# Patient Record
Sex: Female | Born: 1935 | Race: White | Hispanic: No | Smoking: Never smoker
Health system: Southern US, Community
[De-identification: ages and names within clinical notes are randomized; demographics above are authoritative.]

## PROBLEM LIST (undated history)

## (undated) DIAGNOSIS — M1712 Unilateral primary osteoarthritis, left knee: Secondary | ICD-10-CM

## (undated) DIAGNOSIS — E785 Hyperlipidemia, unspecified: Secondary | ICD-10-CM

## (undated) DIAGNOSIS — R112 Nausea with vomiting, unspecified: Secondary | ICD-10-CM

## (undated) DIAGNOSIS — R5383 Other fatigue: Secondary | ICD-10-CM

## (undated) DIAGNOSIS — M1990 Unspecified osteoarthritis, unspecified site: Secondary | ICD-10-CM

## (undated) DIAGNOSIS — R059 Cough, unspecified: Secondary | ICD-10-CM

## (undated) DIAGNOSIS — D649 Anemia, unspecified: Secondary | ICD-10-CM

## (undated) DIAGNOSIS — R238 Other skin changes: Secondary | ICD-10-CM

## (undated) DIAGNOSIS — Z9889 Other specified postprocedural states: Secondary | ICD-10-CM

## (undated) DIAGNOSIS — R05 Cough: Secondary | ICD-10-CM

## (undated) DIAGNOSIS — J40 Bronchitis, not specified as acute or chronic: Secondary | ICD-10-CM

## (undated) DIAGNOSIS — R233 Spontaneous ecchymoses: Secondary | ICD-10-CM

## (undated) DIAGNOSIS — C801 Malignant (primary) neoplasm, unspecified: Secondary | ICD-10-CM

## (undated) DIAGNOSIS — F419 Anxiety disorder, unspecified: Secondary | ICD-10-CM

## (undated) HISTORY — DX: Hyperlipidemia, unspecified: E78.5

## (undated) HISTORY — PX: OVARY SURGERY: SHX727

## (undated) HISTORY — DX: Malignant (primary) neoplasm, unspecified: C80.1

## (undated) HISTORY — DX: Other skin changes: R23.8

## (undated) HISTORY — DX: Bronchitis, not specified as acute or chronic: J40

## (undated) HISTORY — PX: KNEE SURGERY: SHX244

## (undated) HISTORY — DX: Cough, unspecified: R05.9

## (undated) HISTORY — DX: Unspecified osteoarthritis, unspecified site: M19.90

## (undated) HISTORY — DX: Spontaneous ecchymoses: R23.3

## (undated) HISTORY — DX: Other fatigue: R53.83

## (undated) HISTORY — DX: Anemia, unspecified: D64.9

## (undated) HISTORY — DX: Cough: R05

---
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
## 1964-12-10 HISTORY — PX: APPENDECTOMY: SHX54

## 1966-12-10 HISTORY — PX: SPIDER VEIN INJECTION: SHX783

## 1975-12-11 HISTORY — PX: ABDOMINAL HYSTERECTOMY: SHX81

## 1994-12-10 HISTORY — PX: ANKLE SURGERY: SHX546

## 2003-08-04 ENCOUNTER — Other Ambulatory Visit: Admission: RE | Admit: 2003-08-04 | Discharge: 2003-08-04 | Payer: Self-pay | Admitting: Obstetrics and Gynecology

## 2003-08-04 ENCOUNTER — Encounter: Payer: Self-pay | Admitting: *Deleted

## 2003-08-04 ENCOUNTER — Encounter: Admission: RE | Admit: 2003-08-04 | Discharge: 2003-08-04 | Payer: Self-pay | Admitting: *Deleted

## 2003-08-27 ENCOUNTER — Ambulatory Visit (HOSPITAL_COMMUNITY): Admission: RE | Admit: 2003-08-27 | Discharge: 2003-08-27 | Payer: Self-pay | Admitting: *Deleted

## 2003-08-30 ENCOUNTER — Encounter: Payer: Self-pay | Admitting: *Deleted

## 2003-08-30 ENCOUNTER — Ambulatory Visit (HOSPITAL_COMMUNITY): Admission: RE | Admit: 2003-08-30 | Discharge: 2003-08-30 | Payer: Self-pay | Admitting: *Deleted

## 2004-12-12 ENCOUNTER — Ambulatory Visit: Payer: Self-pay | Admitting: Family Medicine

## 2004-12-21 ENCOUNTER — Encounter: Admission: RE | Admit: 2004-12-21 | Discharge: 2004-12-21 | Payer: Self-pay | Admitting: Orthopedic Surgery

## 2005-10-18 ENCOUNTER — Ambulatory Visit: Payer: Self-pay | Admitting: Family Medicine

## 2006-02-25 ENCOUNTER — Ambulatory Visit: Payer: Self-pay | Admitting: Family Medicine

## 2006-03-08 ENCOUNTER — Encounter: Admission: RE | Admit: 2006-03-08 | Discharge: 2006-03-08 | Payer: Self-pay | Admitting: Family Medicine

## 2006-03-08 ENCOUNTER — Ambulatory Visit: Payer: Self-pay

## 2006-03-27 ENCOUNTER — Other Ambulatory Visit: Admission: RE | Admit: 2006-03-27 | Discharge: 2006-03-27 | Payer: Self-pay | Admitting: Family Medicine

## 2006-03-27 ENCOUNTER — Ambulatory Visit: Payer: Self-pay | Admitting: Family Medicine

## 2006-04-12 ENCOUNTER — Encounter: Admission: RE | Admit: 2006-04-12 | Discharge: 2006-04-12 | Payer: Self-pay | Admitting: Family Medicine

## 2006-04-25 ENCOUNTER — Ambulatory Visit: Payer: Self-pay | Admitting: Cardiovascular Disease

## 2006-07-11 ENCOUNTER — Ambulatory Visit: Payer: Self-pay | Admitting: Family Medicine

## 2006-10-03 ENCOUNTER — Ambulatory Visit: Payer: Self-pay | Admitting: Family Medicine

## 2006-11-27 ENCOUNTER — Encounter: Admission: RE | Admit: 2006-11-27 | Discharge: 2006-11-27 | Payer: Self-pay | Admitting: Orthopedic Surgery

## 2006-12-20 ENCOUNTER — Encounter: Admission: RE | Admit: 2006-12-20 | Discharge: 2006-12-20 | Payer: Self-pay | Admitting: Orthopedic Surgery

## 2007-01-16 ENCOUNTER — Ambulatory Visit: Payer: Self-pay | Admitting: Family Medicine

## 2007-01-16 LAB — CONVERTED CEMR LAB
ALT: 20 units/L (ref 0–40)
AST: 18 units/L (ref 0–37)
Albumin: 3.9 g/dL (ref 3.5–5.2)
BUN: 19 mg/dL (ref 6–23)
Glucose, Bld: 106 mg/dL — ABNORMAL HIGH (ref 70–99)
Hgb A1c MFr Bld: 5.8 % (ref 4.6–6.0)
Sodium: 140 meq/L (ref 135–145)
Total Bilirubin: 0.7 mg/dL (ref 0.3–1.2)

## 2007-01-20 ENCOUNTER — Encounter: Payer: Self-pay | Admitting: Family Medicine

## 2007-02-28 ENCOUNTER — Encounter: Admission: RE | Admit: 2007-02-28 | Discharge: 2007-02-28 | Payer: Self-pay | Admitting: Orthopedic Surgery

## 2007-08-07 DIAGNOSIS — I8390 Asymptomatic varicose veins of unspecified lower extremity: Secondary | ICD-10-CM | POA: Insufficient documentation

## 2007-08-07 DIAGNOSIS — F419 Anxiety disorder, unspecified: Secondary | ICD-10-CM | POA: Insufficient documentation

## 2007-09-02 ENCOUNTER — Ambulatory Visit: Payer: Self-pay | Admitting: Family Medicine

## 2007-10-02 ENCOUNTER — Encounter: Payer: Self-pay | Admitting: Family Medicine

## 2007-10-30 ENCOUNTER — Ambulatory Visit: Payer: Self-pay | Admitting: Family Medicine

## 2008-10-13 ENCOUNTER — Ambulatory Visit: Payer: Self-pay | Admitting: Family Medicine

## 2009-11-22 DIAGNOSIS — R799 Abnormal finding of blood chemistry, unspecified: Secondary | ICD-10-CM | POA: Insufficient documentation

## 2009-11-22 DIAGNOSIS — R599 Enlarged lymph nodes, unspecified: Secondary | ICD-10-CM | POA: Insufficient documentation

## 2009-11-22 DIAGNOSIS — R5381 Other malaise: Secondary | ICD-10-CM | POA: Insufficient documentation

## 2010-04-12 DIAGNOSIS — L659 Nonscarring hair loss, unspecified: Secondary | ICD-10-CM | POA: Insufficient documentation

## 2010-04-12 DIAGNOSIS — R229 Localized swelling, mass and lump, unspecified: Secondary | ICD-10-CM | POA: Insufficient documentation

## 2010-04-12 DIAGNOSIS — E559 Vitamin D deficiency, unspecified: Secondary | ICD-10-CM | POA: Insufficient documentation

## 2010-04-12 LAB — HM PAP SMEAR: HM Pap smear: NEGATIVE

## 2010-04-13 ENCOUNTER — Ambulatory Visit: Payer: Self-pay | Admitting: Family Medicine

## 2010-06-09 ENCOUNTER — Ambulatory Visit: Payer: Self-pay

## 2010-07-05 ENCOUNTER — Ambulatory Visit: Payer: Self-pay

## 2010-08-30 ENCOUNTER — Encounter: Admission: RE | Admit: 2010-08-30 | Discharge: 2010-08-30 | Payer: Self-pay | Admitting: Orthopedic Surgery

## 2011-08-11 DIAGNOSIS — J40 Bronchitis, not specified as acute or chronic: Secondary | ICD-10-CM

## 2011-08-11 HISTORY — DX: Bronchitis, not specified as acute or chronic: J40

## 2011-09-25 ENCOUNTER — Ambulatory Visit (INDEPENDENT_AMBULATORY_CARE_PROVIDER_SITE_OTHER): Payer: Medicare Other | Admitting: General Surgery

## 2011-09-25 ENCOUNTER — Other Ambulatory Visit (INDEPENDENT_AMBULATORY_CARE_PROVIDER_SITE_OTHER): Payer: Self-pay | Admitting: General Surgery

## 2011-09-25 ENCOUNTER — Encounter (INDEPENDENT_AMBULATORY_CARE_PROVIDER_SITE_OTHER): Payer: Self-pay | Admitting: General Surgery

## 2011-09-25 VITALS — BP 116/84 | HR 64 | Temp 96.3°F | Resp 20 | Ht 67.5 in | Wt 205.4 lb

## 2011-09-25 DIAGNOSIS — J40 Bronchitis, not specified as acute or chronic: Secondary | ICD-10-CM

## 2011-09-25 DIAGNOSIS — D649 Anemia, unspecified: Secondary | ICD-10-CM

## 2011-09-25 DIAGNOSIS — R1909 Other intra-abdominal and pelvic swelling, mass and lump: Secondary | ICD-10-CM | POA: Insufficient documentation

## 2011-09-25 NOTE — Progress Notes (Signed)
Chief Complaint  Patient presents with  . Other    new pt- eval of umbilical hernia     HPI Nancy Downs is a 75 y.o. female.   HPI She is self-referred for which she feels is an umbilical hernia. She noticed a nodule in the umbilicus about 2-3 months ago. He was also noted by her nurse practitioner. It doesn't cause her pain and less somebody touches it. No nausea or vomiting. She has had a recent fever and cough and is being treated for bronchitis currently. No significant change in her bowel habits. No obstructive symptoms. She has noted some discoloration of the umbilicus. Past Medical History  Diagnosis Date  . Hyperlipidemia   . Cancer   . Arthritis   . Anemia   . Fever   . Cough   . Constipation   . Easy bruising   . Fatigue   . Bronchitis     Past Surgical History  Procedure Date  . Tonsillectomy and adenoidectomy 1945  . Appendectomy 1966  . Spider vein injection 1968    varicose veins stripped   . Abdominal hysterectomy 1977    uterine cancer   . Ankle surgery 1996    broken ankle plates and pins put in place   . Knee surgery 2004/2012    Family History  Problem Relation Age of Onset  . Cancer Mother 23    ovarian   . Cancer Father 31    lung cancer    Social History History  Substance Use Topics  . Smoking status: Never Smoker   . Smokeless tobacco: Never Used  . Alcohol Use: Yes    No Known Allergies  Current Outpatient Prescriptions  Medication Sig Dispense Refill  . ALPRAZolam (XANAX) 0.5 MG tablet Take 0.5 mg by mouth at bedtime as needed.        Marland Kitchen amoxicillin-clavulanate (AUGMENTIN) 875-125 MG per tablet       . chlorhexidine (PERIDEX) 0.12 % solution       . citalopram (CELEXA) 20 MG tablet       . clobetasol (TEMOVATE) 0.05 % external solution       . diphenhydramine-acetaminophen (TYLENOL PM) 25-500 MG TABS Take 1 tablet by mouth at bedtime as needed.        . ergocalciferol (VITAMIN D2) 50000 UNITS capsule Take 50,000 Units by mouth  every 30 (thirty) days.        . halobetasol (ULTRAVATE) 0.05 % cream       . HYDROcodone-acetaminophen (VICODIN) 5-500 MG per tablet       . lovastatin (MEVACOR) 20 MG tablet         Review of Systems Review of Systems  Constitutional: Positive for fever.  Respiratory: Positive for cough.   Cardiovascular: Negative.   Gastrointestinal:       As per history of present illness.    Blood pressure 116/84, pulse 64, temperature 96.3 F (35.7 C), resp. rate 20, height 5' 7.5" (1.715 m), weight 205 lb 6 oz (93.157 kg).  Physical Exam Physical Exam  Constitutional: No distress.       Overweight.  Neck: Neck supple.  Cardiovascular: Normal rate and regular rhythm.   No murmur heard. Pulmonary/Chest: Effort normal and breath sounds normal. She has no wheezes. She has no rales.  Abdominal: Soft. Bowel sounds are normal.       Slightly tender firm umbilical nodule with mild bluish discoloration accentuated when she stands. It is not reducible. No drainage.  Musculoskeletal:  She exhibits edema.  Lymphadenopathy:    She has no cervical adenopathy.    Data Reviewed none Assessment    Umbilical nodule that has been present for 2-3 months. Differential diagnosis includes chronically incarcerated umbilical hernia, umbilical cyst, other undefined neoplasm.  She also has anemia and bronchitis. Her primary care team once her to have a colonoscopy.    Plan    Obtain chest x-ray. Obtain CT of abdomen and pelvis. Defer surgery until her pulmonary infection is cleared.  If this is an umbilical hernia, we discussed repair. This includes potentially removing the umbilicus. We may not be able to use mesh because of inflammatory changes.  I have discussed the procedure, risks, and aftercare.  Risks include but are not limited to bleeding, infection, wound problems, anesthesia, recurrence, injury to intestine, mesh problems.  We also discussed not knowing what the umbilicus would look like in the  future.  He/she seems to understand and agrees with the plan.  We'll discuss the results of her x-rays we get them back.         Heith Haigler J 09/25/2011, 2:24 PM

## 2011-09-25 NOTE — Patient Instructions (Signed)
We will call you with the results. Your lung infection will need to be cleared before we consider any surgery.

## 2011-09-27 ENCOUNTER — Ambulatory Visit (INDEPENDENT_AMBULATORY_CARE_PROVIDER_SITE_OTHER): Payer: Medicare Other | Admitting: General Surgery

## 2011-09-27 ENCOUNTER — Other Ambulatory Visit (INDEPENDENT_AMBULATORY_CARE_PROVIDER_SITE_OTHER): Payer: Self-pay | Admitting: General Surgery

## 2011-09-27 ENCOUNTER — Ambulatory Visit
Admission: RE | Admit: 2011-09-27 | Discharge: 2011-09-27 | Disposition: A | Discharge status: Home / Self Care | Payer: Medicare Other | Source: Ambulatory Visit | Attending: General Surgery | Admitting: General Surgery

## 2011-09-27 VITALS — BP 126/82 | HR 88 | Temp 97.8°F | Resp 16 | Ht 67.5 in | Wt 206.2 lb

## 2011-09-27 DIAGNOSIS — J40 Bronchitis, not specified as acute or chronic: Secondary | ICD-10-CM

## 2011-09-27 DIAGNOSIS — D649 Anemia, unspecified: Secondary | ICD-10-CM

## 2011-09-27 DIAGNOSIS — R1909 Other intra-abdominal and pelvic swelling, mass and lump: Secondary | ICD-10-CM

## 2011-09-27 DIAGNOSIS — C799 Secondary malignant neoplasm of unspecified site: Secondary | ICD-10-CM

## 2011-09-27 DIAGNOSIS — C801 Malignant (primary) neoplasm, unspecified: Secondary | ICD-10-CM | POA: Insufficient documentation

## 2011-09-27 LAB — CBC
HCT: 36.5 % (ref 36.0–46.0)
Hemoglobin: 12.3 g/dL (ref 12.0–15.0)
MCV: 90.1 fL (ref 78.0–100.0)
Platelets: 370 10*3/uL (ref 150–400)
RBC: 4.05 MIL/uL (ref 3.87–5.11)
WBC: 9.4 10*3/uL (ref 4.0–10.5)

## 2011-09-27 LAB — COMPREHENSIVE METABOLIC PANEL
Albumin: 4.8 g/dL (ref 3.5–5.2)
Alkaline Phosphatase: 156 U/L — ABNORMAL HIGH (ref 39–117)
CO2: 26 mEq/L (ref 19–32)
Glucose, Bld: 105 mg/dL — ABNORMAL HIGH (ref 70–99)
Potassium: 4.7 mEq/L (ref 3.5–5.3)
Total Protein: 7.9 g/dL (ref 6.0–8.3)

## 2011-09-27 MED ORDER — IOHEXOL 300 MG/ML  SOLN
100.0000 mL | Freq: Once | INTRAMUSCULAR | Status: AC | PRN
  Administered 2011-09-27: 100 mL via INTRAVENOUS

## 2011-09-27 NOTE — Progress Notes (Signed)
I called Nancy Downs and asked her to come in to discuss the results of her CT scan. It demonstrates multiple tumors in her liver, and abnormal thickened stomach, a large irregular pelvic mass and other abdominal masses. There is a abdominal wall mass at the umbilicus. This is highly suspicious for metastatic cancer. Primary unknown. Chest x-ray shows no acute disease. I have discussed this extensively with her and showed her the CT pictures.  The plan now will be referral to medical oncology and I talked with Dr. Humphrey Rolls about her. We'll also send her for tumor markers and complete blood work. She seems to understand the situation and the seriousness of it.

## 2011-09-28 ENCOUNTER — Encounter: Payer: Medicare Other | Admitting: Oncology

## 2011-09-28 LAB — CA 125: CA 125: 271.7 U/mL — ABNORMAL HIGH (ref 0.0–30.2)

## 2011-09-28 LAB — PROTIME-INR
INR: 0.92 (ref <1.50)
Prothrombin Time: 12.7 seconds (ref 11.6–15.2)

## 2011-09-28 LAB — APTT: aPTT: 31 seconds (ref 24–37)

## 2011-09-28 LAB — CANCER ANTIGEN 27.29: CA 27.29: 218 U/mL — ABNORMAL HIGH (ref 0–39)

## 2011-10-02 ENCOUNTER — Encounter (HOSPITAL_BASED_OUTPATIENT_CLINIC_OR_DEPARTMENT_OTHER): Payer: BLUE CROSS/BLUE SHIELD | Admitting: Oncology

## 2011-10-02 ENCOUNTER — Other Ambulatory Visit: Payer: Self-pay | Admitting: Oncology

## 2011-10-02 DIAGNOSIS — C541 Malignant neoplasm of endometrium: Secondary | ICD-10-CM

## 2011-10-02 DIAGNOSIS — Z8542 Personal history of malignant neoplasm of other parts of uterus: Secondary | ICD-10-CM

## 2011-10-02 DIAGNOSIS — D499 Neoplasm of unspecified behavior of unspecified site: Secondary | ICD-10-CM

## 2011-10-02 DIAGNOSIS — D099 Carcinoma in situ, unspecified: Secondary | ICD-10-CM

## 2011-10-02 LAB — CBC WITH DIFFERENTIAL/PLATELET
Basophils Absolute: 0 10*3/uL (ref 0.0–0.1)
Eosinophils Absolute: 0.2 10*3/uL (ref 0.0–0.5)
HCT: 32.9 % — ABNORMAL LOW (ref 34.8–46.6)
HGB: 11.3 g/dL — ABNORMAL LOW (ref 11.6–15.9)
NEUT#: 4.3 10*3/uL (ref 1.5–6.5)
NEUT%: 68.5 % (ref 38.4–76.8)
RDW: 13 % (ref 11.2–14.5)
lymph#: 1.3 10*3/uL (ref 0.9–3.3)

## 2011-10-02 LAB — COMPREHENSIVE METABOLIC PANEL
ALT: 19 U/L (ref 0–35)
CO2: 25 mEq/L (ref 19–32)
Calcium: 9.3 mg/dL (ref 8.4–10.5)
Chloride: 101 mEq/L (ref 96–112)
Creatinine, Ser: 0.66 mg/dL (ref 0.50–1.10)
Glucose, Bld: 105 mg/dL — ABNORMAL HIGH (ref 70–99)
Total Bilirubin: 0.5 mg/dL (ref 0.3–1.2)
Total Protein: 7.2 g/dL (ref 6.0–8.3)

## 2011-10-02 LAB — CEA: CEA: <0.5 ng/mL (ref 0.0–5.0)

## 2011-10-02 LAB — LACTATE DEHYDROGENASE: LDH: 215 U/L (ref 94–250)

## 2011-10-04 ENCOUNTER — Other Ambulatory Visit: Payer: Self-pay | Admitting: Oncology

## 2011-10-04 DIAGNOSIS — C541 Malignant neoplasm of endometrium: Secondary | ICD-10-CM

## 2011-10-08 ENCOUNTER — Encounter (HOSPITAL_COMMUNITY)
Admission: RE | Admit: 2011-10-08 | Discharge: 2011-10-08 | Disposition: A | Discharge status: Home / Self Care | Payer: Medicare Other | Source: Ambulatory Visit | Attending: Oncology | Admitting: Oncology

## 2011-10-08 DIAGNOSIS — C549 Malignant neoplasm of corpus uteri, unspecified: Secondary | ICD-10-CM | POA: Insufficient documentation

## 2011-10-08 DIAGNOSIS — C541 Malignant neoplasm of endometrium: Secondary | ICD-10-CM

## 2011-10-08 MED ORDER — FLUDEOXYGLUCOSE F - 18 (FDG) INJECTION
19.8000 | Freq: Once | INTRAVENOUS | Status: AC | PRN
Start: 2011-10-08 — End: 2011-10-08
  Administered 2011-10-08: 19.8 via INTRAVENOUS

## 2011-10-09 ENCOUNTER — Other Ambulatory Visit: Payer: Self-pay | Admitting: Interventional Radiology

## 2011-10-09 ENCOUNTER — Ambulatory Visit (HOSPITAL_COMMUNITY)
Admission: RE | Admit: 2011-10-09 | Discharge: 2011-10-09 | Disposition: A | Discharge status: Home / Self Care | Payer: Medicare Other | Source: Ambulatory Visit | Attending: Oncology | Admitting: Oncology

## 2011-10-09 DIAGNOSIS — C50919 Malignant neoplasm of unspecified site of unspecified female breast: Secondary | ICD-10-CM | POA: Insufficient documentation

## 2011-10-09 DIAGNOSIS — C779 Secondary and unspecified malignant neoplasm of lymph node, unspecified: Secondary | ICD-10-CM | POA: Insufficient documentation

## 2011-10-09 DIAGNOSIS — C801 Malignant (primary) neoplasm, unspecified: Secondary | ICD-10-CM | POA: Insufficient documentation

## 2011-10-09 DIAGNOSIS — C541 Malignant neoplasm of endometrium: Secondary | ICD-10-CM

## 2011-10-09 LAB — CBC
MCH: 28.8 pg (ref 26.0–34.0)
MCV: 89.6 fL (ref 78.0–100.0)
Platelets: 321 10*3/uL (ref 150–400)
RBC: 3.86 MIL/uL — ABNORMAL LOW (ref 3.87–5.11)

## 2011-10-09 LAB — APTT: aPTT: 28 seconds (ref 24–37)

## 2011-10-12 ENCOUNTER — Other Ambulatory Visit: Payer: Self-pay | Admitting: Emergency Medicine

## 2011-10-12 ENCOUNTER — Inpatient Hospital Stay (HOSPITAL_COMMUNITY)
Admission: EM | Admit: 2011-10-12 | Discharge: 2011-10-15 | DRG: 603 | Disposition: A | Discharge status: Home / Self Care | Payer: Medicare Other | Attending: Internal Medicine | Admitting: Internal Medicine

## 2011-10-12 ENCOUNTER — Emergency Department (HOSPITAL_COMMUNITY): Payer: Medicare Other

## 2011-10-12 DIAGNOSIS — Z8542 Personal history of malignant neoplasm of other parts of uterus: Secondary | ICD-10-CM

## 2011-10-12 DIAGNOSIS — C801 Malignant (primary) neoplasm, unspecified: Secondary | ICD-10-CM | POA: Diagnosis present

## 2011-10-12 DIAGNOSIS — Z85828 Personal history of other malignant neoplasm of skin: Secondary | ICD-10-CM

## 2011-10-12 DIAGNOSIS — N390 Urinary tract infection, site not specified: Secondary | ICD-10-CM | POA: Diagnosis present

## 2011-10-12 DIAGNOSIS — R1909 Other intra-abdominal and pelvic swelling, mass and lump: Secondary | ICD-10-CM

## 2011-10-12 DIAGNOSIS — R509 Fever, unspecified: Secondary | ICD-10-CM

## 2011-10-12 DIAGNOSIS — L02219 Cutaneous abscess of trunk, unspecified: Principal | ICD-10-CM | POA: Diagnosis present

## 2011-10-12 DIAGNOSIS — R000 Tachycardia, unspecified: Secondary | ICD-10-CM | POA: Diagnosis present

## 2011-10-12 DIAGNOSIS — L03311 Cellulitis of abdominal wall: Secondary | ICD-10-CM

## 2011-10-12 DIAGNOSIS — F329 Major depressive disorder, single episode, unspecified: Secondary | ICD-10-CM | POA: Diagnosis present

## 2011-10-12 DIAGNOSIS — E785 Hyperlipidemia, unspecified: Secondary | ICD-10-CM

## 2011-10-12 DIAGNOSIS — R5081 Fever presenting with conditions classified elsewhere: Secondary | ICD-10-CM | POA: Diagnosis present

## 2011-10-12 DIAGNOSIS — Z803 Family history of malignant neoplasm of breast: Secondary | ICD-10-CM

## 2011-10-12 DIAGNOSIS — D649 Anemia, unspecified: Secondary | ICD-10-CM | POA: Diagnosis present

## 2011-10-12 DIAGNOSIS — Z8041 Family history of malignant neoplasm of ovary: Secondary | ICD-10-CM

## 2011-10-12 DIAGNOSIS — Z79899 Other long term (current) drug therapy: Secondary | ICD-10-CM

## 2011-10-12 DIAGNOSIS — L03319 Cellulitis of trunk, unspecified: Principal | ICD-10-CM | POA: Diagnosis present

## 2011-10-12 DIAGNOSIS — F3289 Other specified depressive episodes: Secondary | ICD-10-CM | POA: Diagnosis present

## 2011-10-12 DIAGNOSIS — Z801 Family history of malignant neoplasm of trachea, bronchus and lung: Secondary | ICD-10-CM

## 2011-10-12 LAB — DIFFERENTIAL
Basophils Absolute: 0 10*3/uL (ref 0.0–0.1)
Basophils Relative: 0 % (ref 0–1)
Eosinophils Absolute: 0.3 10*3/uL (ref 0.0–0.7)
Eosinophils Relative: 4 % (ref 0–5)
Lymphocytes Relative: 16 % (ref 12–46)
Lymphs Abs: 1.3 10*3/uL (ref 0.7–4.0)
Monocytes Absolute: 0.4 10*3/uL (ref 0.1–1.0)
Monocytes Relative: 6 % (ref 3–12)
Neutro Abs: 5.9 10*3/uL (ref 1.7–7.7)
Neutrophils Relative %: 74 % (ref 43–77)

## 2011-10-12 LAB — LACTIC ACID, PLASMA: Lactic Acid, Venous: 1.1 mmol/L (ref 0.5–2.2)

## 2011-10-12 LAB — CBC
HCT: 31.1 % — ABNORMAL LOW (ref 36.0–46.0)
Hemoglobin: 10.1 g/dL — ABNORMAL LOW (ref 12.0–15.0)
MCH: 29 pg (ref 26.0–34.0)
MCHC: 32.5 g/dL (ref 30.0–36.0)
MCV: 89.4 fL (ref 78.0–100.0)
Platelets: 294 10*3/uL (ref 150–400)
RBC: 3.48 MIL/uL — ABNORMAL LOW (ref 3.87–5.11)
RDW: 13.1 % (ref 11.5–15.5)
WBC: 8 10*3/uL (ref 4.0–10.5)

## 2011-10-12 LAB — COMPREHENSIVE METABOLIC PANEL
ALT: 45 U/L — ABNORMAL HIGH (ref 0–35)
AST: 34 U/L (ref 0–37)
Albumin: 3.7 g/dL (ref 3.5–5.2)
Alkaline Phosphatase: 199 U/L — ABNORMAL HIGH (ref 39–117)
BUN: 13 mg/dL (ref 6–23)
CO2: 24 mEq/L (ref 19–32)
Calcium: 9.3 mg/dL (ref 8.4–10.5)
Chloride: 99 mEq/L (ref 96–112)
Creatinine, Ser: 0.64 mg/dL (ref 0.50–1.10)
GFR calc Af Amer: >90 mL/min (ref >90)
GFR calc non Af Amer: 86 mL/min — ABNORMAL LOW (ref >90)
Glucose, Bld: 100 mg/dL — ABNORMAL HIGH (ref 70–99)
Potassium: 3.9 mEq/L (ref 3.5–5.1)
Sodium: 134 mEq/L — ABNORMAL LOW (ref 135–145)
Total Bilirubin: 0.3 mg/dL (ref 0.3–1.2)
Total Protein: 7.4 g/dL (ref 6.0–8.3)

## 2011-10-12 LAB — URINALYSIS, ROUTINE W REFLEX MICROSCOPIC
Ketones, ur: NEGATIVE mg/dL
Leukocytes, UA: NEGATIVE
Nitrite: NEGATIVE
Urobilinogen, UA: 0.2 mg/dL (ref 0.0–1.0)
pH: 5.5 (ref 5.0–8.0)

## 2011-10-12 LAB — LIPASE, BLOOD: Lipase: 31 U/L (ref 11–59)

## 2011-10-12 LAB — PROTIME-INR
INR: 0.96 (ref 0.00–1.49)
Prothrombin Time: 13 seconds (ref 11.6–15.2)

## 2011-10-13 ENCOUNTER — Emergency Department (HOSPITAL_COMMUNITY): Payer: Medicare Other

## 2011-10-13 LAB — FERRITIN: Ferritin: 164 ng/mL (ref 10–291)

## 2011-10-13 LAB — CBC
Hemoglobin: 9.4 g/dL — ABNORMAL LOW (ref 12.0–15.0)
MCH: 29.3 pg (ref 26.0–34.0)
MCV: 88.2 fL (ref 78.0–100.0)
Platelets: 262 10*3/uL (ref 150–400)
RBC: 3.21 MIL/uL — ABNORMAL LOW (ref 3.87–5.11)
WBC: 7.4 10*3/uL (ref 4.0–10.5)

## 2011-10-13 LAB — DIFFERENTIAL
Basophils Relative: 0 % (ref 0–1)
Lymphs Abs: 1.5 10*3/uL (ref 0.7–4.0)
Monocytes Relative: 7 % (ref 3–12)
Neutro Abs: 5.2 10*3/uL (ref 1.7–7.7)
Neutrophils Relative %: 70 % (ref 43–77)

## 2011-10-13 LAB — BASIC METABOLIC PANEL
CO2: 25 mEq/L (ref 19–32)
Calcium: 8.5 mg/dL (ref 8.4–10.5)
Chloride: 101 mEq/L (ref 96–112)
Glucose, Bld: 122 mg/dL — ABNORMAL HIGH (ref 70–99)
Sodium: 134 mEq/L — ABNORMAL LOW (ref 135–145)

## 2011-10-13 LAB — IRON AND TIBC
Iron: 21 ug/dL — ABNORMAL LOW (ref 42–135)
TIBC: 246 ug/dL — ABNORMAL LOW (ref 250–470)
UIBC: 225 ug/dL (ref 125–400)

## 2011-10-13 LAB — FOLATE: Folate: 16.3 ng/mL

## 2011-10-13 LAB — VITAMIN B12: Vitamin B-12: 289 pg/mL (ref 211–911)

## 2011-10-13 MED ORDER — ALUM & MAG HYDROXIDE-SIMETH 200-200-20 MG/5ML PO SUSP: 30.0000 mL | Freq: Four times a day (QID) | ORAL | Status: DC | PRN

## 2011-10-13 MED ORDER — PIPERACILLIN-TAZOBACTAM 3.375 G IVPB
3.3750 g | Freq: Three times a day (TID) | INTRAVENOUS | Status: DC
  Administered 2011-10-14 – 2011-10-15 (×4): 3.375 g via INTRAVENOUS
  Filled 2011-10-13 (×10): qty 50

## 2011-10-13 MED ORDER — CALCIUM CARBONATE-VITAMIN D 500-200 MG-UNIT PO TABS
1.0000 | ORAL_TABLET | Freq: Two times a day (BID) | ORAL | Status: DC
  Administered 2011-10-14 – 2011-10-15 (×3): 1 via ORAL
  Filled 2011-10-13 (×5): qty 1

## 2011-10-13 MED ORDER — ONDANSETRON HCL 4 MG/2ML IJ SOLN
4.0000 mg | Freq: Four times a day (QID) | INTRAMUSCULAR | Status: DC | PRN
  Administered 2011-10-14: 4 mg via INTRAVENOUS

## 2011-10-13 MED ORDER — ACETAMINOPHEN 500 MG PO TABS
500.0000 mg | ORAL_TABLET | Freq: Every day | ORAL | Status: DC
  Administered 2011-10-14: 500 mg via ORAL
  Filled 2011-10-13: qty 1

## 2011-10-13 MED ORDER — SODIUM CHLORIDE 0.9 % IV SOLN
INTRAVENOUS | Status: DC
  Administered 2011-10-14: 06:00:00 via INTRAVENOUS

## 2011-10-13 MED ORDER — FERROUS SULFATE 325 (65 FE) MG PO TABS
325.0000 mg | ORAL_TABLET | Freq: Every day | ORAL | Status: DC
  Administered 2011-10-14 – 2011-10-15 (×2): 325 mg via ORAL
  Filled 2011-10-13 (×2): qty 1

## 2011-10-13 MED ORDER — VITAMIN B-12 100 MCG PO TABS
100.0000 ug | ORAL_TABLET | Freq: Every day | ORAL | Status: DC
  Administered 2011-10-15: 100 ug via ORAL
  Filled 2011-10-13 (×4): qty 1

## 2011-10-13 MED ORDER — OXYCODONE HCL 5 MG PO TABS
5.0000 mg | ORAL_TABLET | ORAL | Status: DC | PRN
  Administered 2011-10-14 – 2011-10-15 (×3): 5 mg via ORAL

## 2011-10-13 MED ORDER — VANCOMYCIN HCL IN DEXTROSE 1-5 GM/200ML-% IV SOLN
1000.0000 mg | Freq: Two times a day (BID) | INTRAVENOUS | Status: DC
  Administered 2011-10-14: 1000 mg via INTRAVENOUS
  Filled 2011-10-13 (×6): qty 200

## 2011-10-13 MED ORDER — SIMVASTATIN 10 MG PO TABS
10.0000 mg | ORAL_TABLET | Freq: Every day | ORAL | Status: DC
  Administered 2011-10-14: 10 mg via ORAL
  Filled 2011-10-13 (×3): qty 1

## 2011-10-13 MED ORDER — VITAMIN D (ERGOCALCIFEROL) 1.25 MG (50000 UNIT) PO CAPS: 50000.0000 [IU] | ORAL_CAPSULE | ORAL | Status: DC

## 2011-10-13 MED ORDER — CITALOPRAM HYDROBROMIDE 20 MG PO TABS
20.0000 mg | ORAL_TABLET | Freq: Every day | ORAL | Status: DC
  Administered 2011-10-14 – 2011-10-15 (×2): 20 mg via ORAL
  Filled 2011-10-13 (×3): qty 1

## 2011-10-13 MED ORDER — DIPHENHYDRAMINE HCL 25 MG PO CAPS
25.0000 mg | ORAL_CAPSULE | Freq: Every day | ORAL | Status: DC
  Administered 2011-10-14: 25 mg via ORAL
  Filled 2011-10-13: qty 1

## 2011-10-13 MED ORDER — HYDROMORPHONE HCL PF 1 MG/ML IJ SOLN
0.5000 mg | INTRAMUSCULAR | Status: DC | PRN
  Administered 2011-10-14 (×3): 1 mg via INTRAVENOUS

## 2011-10-13 MED ORDER — ONDANSETRON HCL 4 MG PO TABS: 4.0000 mg | ORAL_TABLET | Freq: Four times a day (QID) | ORAL | Status: DC | PRN

## 2011-10-13 MED ORDER — ENOXAPARIN SODIUM 40 MG/0.4ML ~~LOC~~ SOLN
40.0000 mg | SUBCUTANEOUS | Status: DC
  Administered 2011-10-14 – 2011-10-15 (×2): 40 mg via SUBCUTANEOUS
  Filled 2011-10-13 (×3): qty 0.4

## 2011-10-13 MED ORDER — ALPRAZOLAM 0.5 MG PO TABS: 0.5000 mg | ORAL_TABLET | Freq: Every day | ORAL | Status: DC | PRN

## 2011-10-13 MED ORDER — FERROUS SULFATE 325 (65 FE) MG PO TABS
325.0000 mg | ORAL_TABLET | Freq: Every day | ORAL | Status: DC
  Filled 2011-10-13: qty 1

## 2011-10-13 MED ORDER — PIPERACILLIN-TAZOBACTAM 3.375 G IVPB
3.3750 g | Freq: Three times a day (TID) | INTRAVENOUS | Status: DC
  Filled 2011-10-13 (×5): qty 50

## 2011-10-13 MED ORDER — SENNA 8.6 MG PO TABS
2.0000 | ORAL_TABLET | Freq: Every day | ORAL | Status: DC | PRN
  Administered 2011-10-14: 17.2 mg via ORAL

## 2011-10-13 MED ORDER — ACETAMINOPHEN 325 MG PO TABS: 650.0000 mg | ORAL_TABLET | ORAL | Status: DC | PRN

## 2011-10-13 MED ORDER — IOHEXOL 300 MG/ML  SOLN
100.0000 mL | Freq: Once | INTRAMUSCULAR | Status: AC | PRN
  Administered 2011-10-13: 100 mL via INTRAVENOUS

## 2011-10-14 DIAGNOSIS — D649 Anemia, unspecified: Secondary | ICD-10-CM | POA: Diagnosis present

## 2011-10-14 DIAGNOSIS — L03311 Cellulitis of abdominal wall: Secondary | ICD-10-CM | POA: Diagnosis present

## 2011-10-14 DIAGNOSIS — E785 Hyperlipidemia, unspecified: Secondary | ICD-10-CM | POA: Diagnosis present

## 2011-10-14 DIAGNOSIS — R509 Fever, unspecified: Secondary | ICD-10-CM | POA: Diagnosis present

## 2011-10-14 DIAGNOSIS — N390 Urinary tract infection, site not specified: Secondary | ICD-10-CM | POA: Diagnosis present

## 2011-10-14 LAB — CBC
HCT: 28.7 % — ABNORMAL LOW (ref 36.0–46.0)
MCH: 29.1 pg (ref 26.0–34.0)
MCHC: 33.1 g/dL (ref 30.0–36.0)
RDW: 13 % (ref 11.5–15.5)

## 2011-10-14 LAB — URINE CULTURE: Culture  Setup Time: 201211030049

## 2011-10-14 LAB — BASIC METABOLIC PANEL
BUN: 7 mg/dL (ref 6–23)
Calcium: 8.8 mg/dL (ref 8.4–10.5)
GFR calc Af Amer: >90 mL/min (ref >90)
GFR calc non Af Amer: 87 mL/min — ABNORMAL LOW (ref >90)
Glucose, Bld: 110 mg/dL — ABNORMAL HIGH (ref 70–99)
Potassium: 3.8 mEq/L (ref 3.5–5.1)
Sodium: 137 mEq/L (ref 135–145)

## 2011-10-14 MED ORDER — DIPHENHYDRAMINE HCL 25 MG PO CAPS
ORAL_CAPSULE | ORAL | Status: AC
  Administered 2011-10-14: 25 mg via ORAL
  Filled 2011-10-14: qty 1

## 2011-10-14 MED ORDER — HYDROMORPHONE HCL PF 1 MG/ML IJ SOLN
INTRAMUSCULAR | Status: AC
  Filled 2011-10-14: qty 1

## 2011-10-14 MED ORDER — ACETAMINOPHEN 500 MG PO TABS
ORAL_TABLET | ORAL | Status: AC
  Administered 2011-10-14: 500 mg via ORAL
  Filled 2011-10-14: qty 1

## 2011-10-14 MED ORDER — ONDANSETRON HCL 4 MG/2ML IJ SOLN
INTRAMUSCULAR | Status: AC
  Filled 2011-10-14: qty 2

## 2011-10-14 MED ORDER — HYDROMORPHONE HCL PF 1 MG/ML IJ SOLN
INTRAMUSCULAR | Status: AC
  Administered 2011-10-14: 1 mg via INTRAVENOUS
  Filled 2011-10-14: qty 1

## 2011-10-14 MED ORDER — OXYCODONE HCL 5 MG PO TABS
ORAL_TABLET | ORAL | Status: AC
  Administered 2011-10-14: 5 mg via ORAL
  Filled 2011-10-14: qty 1

## 2011-10-14 MED ORDER — SENNA 8.6 MG PO TABS
ORAL_TABLET | ORAL | Status: AC
  Filled 2011-10-14: qty 2

## 2011-10-14 NOTE — Progress Notes (Signed)
ANTIBIOTIC CONSULT NOTE - FOLLOW UP  Pharmacy Consult for Vancomycin Indication: Abdominal wall cellulitis; fever  No Known Allergies  Patient Measurements: Height: 5\' 7"  (170.2 cm) Weight: 211 lb 13.8 oz (96.1 kg) IBW/kg (Calculated) : 61.6    Vital Signs: Temp: 98.6 F (37 C) (11/04 1026) Temp src: Oral (11/04 1026) BP: 113/65 mmHg (11/04 1026) Pulse Rate: 81  (11/04 1026) Intake/Output from previous day: 11/03 0701 - 11/04 0700 In: 885 [I.V.:885] Out: 400 [Urine:400] Intake/Output from this shift:    Labs:  Basename 10/14/11 0456 10/13/11 0720 10/12/11 2100  WBC 5.9 7.4 8.0  HGB 9.5* 9.4* 10.1*  PLT 273 262 294  LABCREA -- -- --  CREATININE 0.62 0.67 0.64   Estimated Creatinine Clearance: 74.5 ml/min (by C-G formula based on Cr of 0.62).    Microbiology: Recent Results (from the past 720 hour(s))  URINE CULTURE     Status: Normal (Preliminary result)   Collection Time   10/12/11  8:54 PM      Component Value Range Status Comment   Specimen Description URINE, CLEAN CATCH   Final    Special Requests NONE   Final    Setup Time [id number]   Final    Colony Count 60,000 COLONIES/ML   Final    Culture GRAM NEGATIVE RODS   Final    Report Status PENDING   Incomplete   CULTURE, BLOOD (ROUTINE X 2)     Status: Normal (Preliminary result)   Collection Time   10/12/11  9:10 PM      Component Value Range Status Comment   Specimen Description BLOOD LEFT AC   Final    Special Requests BOTTLES DRAWN AEROBIC AND ANAEROBIC 5 CC EACH   Final    Setup Time [id number]   Final    Culture     Final    Value:        BLOOD CULTURE RECEIVED NO GROWTH TO DATE CULTURE WILL BE HELD FOR 5 DAYS BEFORE ISSUING A FINAL NEGATIVE REPORT   Report Status PENDING   Incomplete   CULTURE, BLOOD (ROUTINE X 2)     Status: Normal (Preliminary result)   Collection Time   10/12/11  9:30 PM      Component Value Range Status Comment   Specimen Description BLOOD NO SITE GIVEN   Final    Special  Requests BOTTLES DRAWN AEROBIC AND ANAEROBIC 4CC   Final    Setup Time [id number]   Final    Culture     Final    Value:        BLOOD CULTURE RECEIVED NO GROWTH TO DATE CULTURE WILL BE HELD FOR 5 DAYS BEFORE ISSUING A FINAL NEGATIVE REPORT   Report Status PENDING   Incomplete    Day 2 - Vancomycin 1000mg  IV q12h Day 2 - Zosyn 3.375 grams IV q8h (extended-infusion)   Assessment:  Clinical improvement and plans for possible discharge tomorrow noted.  Serum creatinine is stable.  Goal of Therapy:   Vancomycin trough level 10-15 mcg/ml  Plan:   Continue present Vancomycin dosage.  No serum level monitoring indicated for now; reconsider if therapy continues beyond tomorrow.   Margretta Sidle K 10/14/2011,12:22 PM

## 2011-10-14 NOTE — Progress Notes (Signed)
Subjective:  No events overnight, patient reports feeling better  Objective:  Vital signs in last 24 hours:  Filed Vitals:   10/13/11 0900 10/14/11 0543  BP:  119/69  Pulse:  85  Temp:  98.5 F (36.9 C)  TempSrc:  Oral  Resp:  16  Height: 5\' 7"  (1.702 m)   Weight: 96.1 kg (211 lb 13.8 oz)   SpO2:  94%    Intake/Output from previous day:   Intake/Output Summary (Last 24 hours) at 10/14/11 0928 Last data filed at 10/14/11 [id number]  Gross per 24 hour  Intake    885 ml  Output    400 ml  Net    485 ml    Physical Exam: General: Alert, awake, oriented x3, in no acute distress. HEENT: No bruits, no goiter. Heart: Regular rate and rhythm, without murmurs, rubs, gallops. Lungs: Clear to auscultation bilaterally. Abdomen: Soft, nontender, nondistended, positive bowel sounds. Abdominal cellulitis improving less erythema noted today. Extremities: No clubbing cyanosis or edema with positive pedal pulses. Neuro: Grossly intact, nonfocal.   Lab Results:  Basic Metabolic Panel:    Component Value Date/Time   NA 137 10/14/2011 0456   K 3.8 10/14/2011 0456   CL 104 10/14/2011 0456   CO2 25 10/14/2011 0456   BUN 7 10/14/2011 0456   CREATININE 0.62 10/14/2011 0456   CREATININE 0.71 09/27/2011 1724   GLUCOSE 110* 10/14/2011 0456   CALCIUM 8.8 10/14/2011 0456   CBC:    Component Value Date/Time   WBC 5.9 10/14/2011 0456   HGB 9.5* 10/14/2011 0456   HGB 11.3* 10/02/2011 1134   HCT 28.7* 10/14/2011 0456   HCT 32.9* 10/02/2011 1134   PLT 273 10/14/2011 0456   PLT 295 10/02/2011 1134   MCV 88.0 10/14/2011 0456   MCV 88.2 10/02/2011 1134   NEUTROABS 5.2 10/13/2011 0720   LYMPHSABS 1.5 10/13/2011 0720   MONOABS 0.5 10/13/2011 0720   EOSABS 0.3 10/13/2011 0720   EOSABS 0.2 10/02/2011 1134   BASOSABS 0.0 10/13/2011 0720   BASOSABS 0.0 10/02/2011 1134    Recent Results (from the past 240 hour(s))  URINE CULTURE     Status: Normal (Preliminary result)   Collection Time   2011/11/04  8:54  PM      Component Value Range Status Comment   Specimen Description URINE, CLEAN CATCH   Final    Special Requests NONE   Final    Setup Time [id number]   Final    Colony Count 60,000 COLONIES/ML   Final    Culture GRAM NEGATIVE RODS   Final    Report Status PENDING   Incomplete     Studies/Results: Dg Chest 2 View 04-Nov-2011   IMPRESSION:  1.  Minimal left basilar linear atelectasis. 2.  Minimal minimal mid to lower thoracic chronic vertebral wedge deformity.    Ct Abdomen Pelvis W Contrast 10/13/2011   IMPRESSION: Interval disease progression with increase in size of several of the lesions, including within the pelvis, along the liver surface, and periumbilical/anterior abdominal wall.  There is mild bilateral hydroureteronephrosis, left greater than right secondary to the pelvic process.  Small right and trace left pleural effusions.  Associated consolidations; atelectasis versus pneumonia.    Medications: Scheduled Meds:   . acetaminophen  500 mg Oral QHS  . calcium-vitamin D  1 tablet Oral BID WC  . citalopram  20 mg Oral Daily  . diphenhydrAMINE  25 mg Oral QHS  . enoxaparin (LOVENOX) injection  40 mg Subcutaneous  Q24H  . ferrous sulfate  325 mg Oral Q breakfast  . piperacillin-tazobactam (ZOSYN)  IV  3.375 g Intravenous Q8H  . simvastatin  10 mg Oral q1800  . vancomycin  1,000 mg Intravenous Q12H  . vitamin B-12  100 mcg Oral Daily  . Vitamin D (Ergocalciferol)  50,000 Units Oral Q7 days  . DISCONTD: ferrous sulfate  325 mg Oral Q breakfast   Continuous Infusions:   . sodium chloride 100 mL/hr at 10/14/11 0623   PRN Meds:.acetaminophen, ALPRAZolam, alum & mag hydroxide-simeth, HYDROmorphone (DILAUDID) injection, ondansetron (ZOFRAN) IV, ondansetron, oxyCODONE, senna  Assessment/Plan:  Principal Problem:  *Abdominal wall cellulitis - clinically improving and patient reports significant improvement in discomfort. Blood cultures are still pending and for that reason  we'll continue vancomycin Zosyn for now and narrow the spectrum last final results with sensitivities are back. Patient remains afebrile with no leukocytosis.  Active Problems:  Carcinoma of unknown primary - followed by Dr. Humphrey Rolls, patient has appointment scheduled on Tuesday, 10/16/2011. We're anticipating discharge November 5 for the patient to followup with Dr. Humphrey Rolls in outpatient setting. If patient stays in the hospital longer we will call Dr. Humphrey Rolls for in-hospital evaluation.   Anemia - hemoglobin hematocrit remained stable  Fever - patient remains afebrile over 48 hours. Continue broad-spectrum antibiotics for now.   HLD (hyperlipidemia) - continue home medication regimen   UTI - urine cultures positive for gram-negative rods but no sensitivities and final culture back yet. We'll continue broad-spectrum antibiotics as noted in primary principal problem.      LOS: 2 days   MAGICK-Cole Eastridge 10/14/2011, 9:28 AM

## 2011-10-15 MED ORDER — OXYCODONE HCL 5 MG PO TABS: 5.0000 mg | ORAL_TABLET | ORAL | Status: DC | PRN

## 2011-10-15 MED ORDER — SULFAMETHOXAZOLE-TMP DS 800-160 MG PO TABS
1.0000 | ORAL_TABLET | Freq: Two times a day (BID) | ORAL | Status: DC
  Administered 2011-10-15: 1 via ORAL
  Filled 2011-10-15 (×2): qty 1

## 2011-10-15 MED ORDER — SULFAMETHOXAZOLE-TRIMETHOPRIM 800-160 MG PO TABS: 1.0000 | ORAL_TABLET | Freq: Two times a day (BID) | ORAL | Status: DC

## 2011-10-15 MED ORDER — OXYCODONE HCL 5 MG PO TABS
ORAL_TABLET | ORAL | Status: AC
  Administered 2011-10-15: 5 mg via ORAL
  Filled 2011-10-15: qty 1

## 2011-10-15 NOTE — Progress Notes (Signed)
Pt D/c home, denies pain, complications resolved, medication /D/C instructions done.

## 2011-10-15 NOTE — Discharge Summary (Signed)
Patient ID: Nancy Downs MRN: [id number] DOB/AGE: 1936/05/13 75 y.o.  Admit date: 10/12/2011 Discharge date: 10/15/2011  Primary Care Physician:  Margarita Rana, MD  Discharge Diagnoses:    Present on Admission:  .Abdominal wall cellulitis .Anemia .Fever .HLD (hyperlipidemia) .Carcinoma of unknown primary .UTI (lower urinary tract infection)  Principal Problem:  *Abdominal wall cellulitis Active Problems:  Carcinoma of unknown primary  Anemia  Fever  HLD (hyperlipidemia)  UTI (lower urinary tract infection)   Current Discharge Medication List    START taking these medications   Details  oxyCODONE (OXY IR/ROXICODONE) 5 MG immediate release tablet Take 1 tablet (5 mg total) by mouth every 4 (four) hours as needed. Qty: 30 tablet, Refills: 0    sulfamethoxazole-trimethoprim (BACTRIM DS) 800-160 MG per tablet Take 1 tablet by mouth 2 (two) times daily. Qty: 28 tablet, Refills: 0      CONTINUE these medications which have NOT CHANGED   Details  ALPRAZolam (XANAX) 0.5 MG tablet Take 0.5 mg by mouth at bedtime as needed.      calcium-vitamin D (OSCAL WITH D) 500-200 MG-UNIT per tablet Take 1 tablet by mouth 2 (two) times daily.      citalopram (CELEXA) 20 MG tablet     diphenhydramine-acetaminophen (TYLENOL PM) 25-500 MG TABS Take 1 tablet by mouth at bedtime as needed.      ergocalciferol (VITAMIN D2) 50000 UNITS capsule Take 50,000 Units by mouth every Friday.     ferrous sulfate 325 (65 FE) MG tablet Take 325 mg by mouth daily with breakfast.      HYDROcodone-acetaminophen (VICODIN) 5-500 MG per tablet Take 0.5-1 tablets by mouth every 6 (six) hours as needed. For pain    lovastatin (MEVACOR) 20 MG tablet     vitamin B-12 (CYANOCOBALAMIN) 250 MCG tablet Take 250 mcg by mouth daily.          Disposition and Follow-up: Pt will follow up with Dr. Humphrey Rolls, oncology specialist Nov 6 th, 2012. Please note patient was discharged on Bactrim double strength for 14 days  total treatment and therapy. Currently blood cultures final results are still pending and if any changes noted we will let the patient now and will readjust antibiotic regimen if indicated.  Consults: None  Significant Diagnostic Studies:  Dg Chest 2 View 10/12/2011     Findings: Normal sized heart.  Minimal linear atelectasis at the left lateral lung base.  Otherwise, clear lungs.  Mild scoliosis. Diffuse osteopenia.  Stable minimal mid to lower thoracic vertebral wedge deformity.  IMPRESSION:  1.  Minimal left basilar linear atelectasis. 2.  Minimal minimal mid to lower thoracic chronic vertebral wedge deformity.    Ct Abdomen Pelvis W Contrast 10/13/2011   Findings: Small right and trace left pleural effusions.  Associated consolidations; atelectasis versus pneumonia. Coronary artery calcification.  Since the prior study the mass along the hepatic dome measures 5.3 x 5.5 cm, without significant interval change. Perihepatic/subcapsular lesions are similar to slightly enlarged. As index along the periphery of the right hepatic lobe, measures 4.2 x 1.9 cm, 3.3 x 1.7 cm previously.  Unremarkable spleen, pancreas, gallbladder.  Stable mild extrahepatic biliary ductal prominence without obstructing lesion identified.  Unremarkable adrenal glands.  Symmetric renal enhancement with fullness of the ureters to the level of the pelvis.  No bowel obstruction.  No CT evidence for colitis.  Thin-walled bladder is displaced by the lobular pelvic mass. Largest diameter, measures 12.8 cm as compared to 12.4 cm previously.  There is a small amount  of associated fluid attenuation. No loculated fluid collection to suggest abscess. The periumbilical implant measures 2.8 x 3.1 cm, 2.3 x 2.3 cm previously.  There are numerous other smaller implants and/or nodules scattered throughout the peritoneum.  A dominant right iliac chain nodal conglomerate measures 4.8 x 3.6 cm, 4.3 x 3.5 cm previously.  There is mass effect on the  iliac vasculature however no vascular thrombus or obstruction identified.  Osteopenia and multilevel degenerative changes.  No acute or aggressive osseous abnormality identified.   IMPRESSION: Interval disease progression with increase in size of several of the lesions, including within the pelvis, along the liver surface, and periumbilical/anterior abdominal wall.  There is mild bilateral hydroureteronephrosis, left greater than right secondary to the pelvic process.  Small right and trace left pleural effusions.  Associated consolidations; atelectasis versus pneumonia.   Brief H and P: This is a 75 year old female, who has been undergoing an evaluation this past week for a possible metastatic cancer of unknown origin.  She underwent a PET scan 4 days ago.  Then, on the next day, had a biopsy performed of a nodule in the supraumbilical area of the abdomen and 1 day ago, she began to have worsening fevers, chills, temperatures to 101, along with increased redness across her abdomen.  Her abdominal area has been tender as well.  She has also had poor appetite.  Denies having any nausea, vomiting, diarrhea, or constipation.  She does report having black stools, but reports that she had been placed on iron for the past week after being found to be anemic.  Patient contacted the on-call oncologist and was advised to report to the emergency department for further evaluation this past evening.   Physical Exam on Discharge:  Filed Vitals:   10/14/11 1300 10/14/11 2200 10/15/11 0220 10/15/11 0520  BP: 110/67 142/78 110/62 115/62  Pulse: 85 76 68 73  Temp: 98.8 F (37.1 C) 99.2 F (37.3 C) 97.7 F (36.5 C) 98.1 F (36.7 C)  TempSrc: Oral Oral Oral Oral  Resp: 18 17 20 20   Height:      Weight:      SpO2: 92% 96% 94% 91%     Intake/Output Summary (Last 24 hours) at 10/15/11 0936 Last data filed at 10/15/11 0220  Gross per 24 hour  Intake    400 ml  Output   1750 ml  Net  -1350 ml    General:  Alert, awake, oriented x3, in no acute distress. HEENT: No bruits, no goiter. Heart: Regular rate and rhythm, without murmurs, rubs, gallops. Lungs: Clear to auscultation bilaterally. Abdomen: Soft, nontender, nondistended, positive bowel sounds. Improving erythema in the periumbilical area. Extremities: No clubbing cyanosis or edema with positive pedal pulses. Neuro: Grossly intact, nonfocal.  CBC:    Component Value Date/Time   WBC 5.9 10/14/2011 0456   HGB 9.5* 10/14/2011 0456   HGB 11.3* 10/02/2011 1134   HCT 28.7* 10/14/2011 0456   HCT 32.9* 10/02/2011 1134   PLT 273 10/14/2011 0456   PLT 295 10/02/2011 1134   MCV 88.0 10/14/2011 0456   MCV 88.2 10/02/2011 1134   NEUTROABS 5.2 10/13/2011 0720   LYMPHSABS 1.5 10/13/2011 0720   MONOABS 0.5 10/13/2011 0720   EOSABS 0.3 10/13/2011 0720   EOSABS 0.2 10/02/2011 1134   BASOSABS 0.0 10/13/2011 0720   BASOSABS 0.0 10/02/2011 0000000    Basic Metabolic Panel:    Component Value Date/Time   NA 137 10/14/2011 0456   K 3.8 10/14/2011 0456  CL 104 10/14/2011 0456   CO2 25 10/14/2011 0456   BUN 7 10/14/2011 0456   CREATININE 0.62 10/14/2011 0456   CREATININE 0.71 09/27/2011 1724   GLUCOSE 110* 10/14/2011 0456   CALCIUM 8.8 10/14/2011 0456    Hospital Course:  Principal Problem:  *Abdominal wall cellulitis - clinically improving, patient remains afebrile over 48 hours. There is also no leukocytosis. We will change antibiotics to by mouth regimen with Bactrim double strength. Patient will followup with Dr. Darrow Bussing tomorrow 10/16/2011.  Active Problems:  Carcinoma of unknown primary - per oncology recommendations   Anemia - this has been monitored during the hospitalization and hemoglobin and hematocrit have remained stable during the hospitalization. Patient has not required blood transfusions.   Fever - likely secondary to problem #1. Patient has responded well to hospital measures with broad-spectrum antibiotics and IV fluids. Abdomen overall  cellulitis clinically improving with improvement in erythema as well. Patient has remained afebrile over 48 hours.   HLD (hyperlipidemia) - stable during the hospitalization  Time spent on Discharge: Over 30 minutes  Signed: MAGICK-Nowell Sites 10/15/2011, 9:36 AM

## 2011-10-15 NOTE — Progress Notes (Signed)
[id number]/Nancy Downs/Utilization review of Chart performed. 

## 2011-10-16 ENCOUNTER — Ambulatory Visit (HOSPITAL_BASED_OUTPATIENT_CLINIC_OR_DEPARTMENT_OTHER): Payer: Medicare Other | Admitting: Oncology

## 2011-10-16 ENCOUNTER — Telehealth: Payer: Self-pay | Admitting: *Deleted

## 2011-10-16 VITALS — BP 139/73 | HR 83 | Temp 97.8°F | Ht 67.0 in | Wt 210.0 lb

## 2011-10-16 DIAGNOSIS — C801 Malignant (primary) neoplasm, unspecified: Secondary | ICD-10-CM

## 2011-10-16 DIAGNOSIS — C569 Malignant neoplasm of unspecified ovary: Secondary | ICD-10-CM

## 2011-10-16 NOTE — Telephone Encounter (Signed)
CALLED DR.ROSENBOWER OFFICE REQUESTED TO HAVE PATIENT'S PORT A CATH PLACED CHEMO SHOULD START AFTER THANKSGIVING GAVE PATIENT APPOINTMENT FOR CHEMO EDUCATION CLASS ON 10-17-2011 AT 10:00AM. GAVE PATIENT FOLLOW UP APPOINTMENT FOR [facility] ON 10-30-2011 AT 11:00AM.

## 2011-10-16 NOTE — Progress Notes (Signed)
Hematology and Oncology Follow Up Visit  Nancy Downs [id number] [birth year] 75 y.o. 10/16/2011 4:20 PM   DIAGNOSIS: 75 yo female with metastatic carcinoma likely endometrial or ovarian primary.  Encounter Diagnoses  Name Primary?  . Adenocarcinoma of unknown primary Yes  . Ovarian cancer, primary, with metastasis from ovary to other site      PAST THERAPY: #1 S/p biopsy of umbilical mass.  Interim History:  Patient is seen in followup today overall she is doing well she had her biopsy of the umbilical mass performed. Unfortunately she did develop what sounds like a cellulitis at the site. She was hospitalized [facility]  over the weekend. She was discharged yesterday with antibiotics. She still has some redness over the umbilical area. She is somewhat tender. She otherwise denies any nausea vomiting fevers chills. She did have fevers prior to going into the hospital. She also is experiencing some lower back pain. She has been taking hydrocodone on a regular basis that has helped her to control her pain. She is asking for another prescription for hydrocodone prescription. Denies having any bleeding problems she has no headaches double vision blurring of vision no fevers presently. She is denying any abdominal pain she has no peripheral paresthesias she has no bleeding.  Medications: I have reviewed the patient's current medications.  Allergies: No Known Allergies  Past Medical History, Surgical history, Social history, and Family History were reviewed and updated.  Review of Systems: Constitutional:  Negative for fever, chills, night sweats, anorexia, weight loss, pain. Cardiovascular: no chest pain or dyspnea on exertion Respiratory: no cough, shortness of breath, or wheezing Neurological: no TIA or stroke symptoms Dermatological: positive for cellulitis on the very umbilical he. ENT: negative Skin Gastrointestinal: no abdominal pain, change in bowel habits, or black or  bloody stools Genito-Urinary: no dysuria, trouble voiding, or hematuria Hematological and Lymphatic: negative  Musculoskeletal: negative Remaining ROS negative.  Physical Exam:  Blood pressure 139/73, pulse 83, temperature 97.8 F (36.6 C), temperature source Oral, height 5\' 7"  (1.702 m), weight 210 lb (95.255 kg).  ECOG: 1   General appearance: alert and cooperative Chest wall: no tenderness Cardio: regular rate and rhythm, S1, S2 normal, no murmur, click, rub or gallop GI: Abdomen appears full there is cellulitis noted around the umbilicus. There is a notable node iin umbilicus region. Extremities: extremities normal, atraumatic, no cyanosis or edema Neurologic: Grossly normal   Lab Results: Lab Results  Component Value Date   WBC 5.9 10/14/2011   HGB 9.5* 10/14/2011   HCT 28.7* 10/14/2011   MCV 88.0 10/14/2011   PLT 273 10/14/2011     Chemistry      Component Value Date/Time   NA 137 10/14/2011 0456   K 3.8 10/14/2011 0456   CL 104 10/14/2011 0456   CO2 25 10/14/2011 0456   BUN 7 10/14/2011 0456   CREATININE 0.62 10/14/2011 0456   CREATININE 0.71 09/27/2011 1724      Component Value Date/Time   CALCIUM 8.8 10/14/2011 0456   ALKPHOS 199* 10/12/2011 2100   AST 34 10/12/2011 2100   ALT 45* 10/12/2011 2100   BILITOT 0.3 10/12/2011 2100       Radiological Studies: 10/08/2011  *RADIOLOGY REPORT*  Clinical Data:  Initial treatment strategy for endometrial carcinoma.  NUCLEAR MEDICINE PET CT INITIAL (PI) SKULL BASE TO THIGH  Technique:  19.8 mCi F-18 FDG was injected intravenously via the right antecubital fossa.  Full-ring PET imaging was performed from the skull base through  the mid-thighs 65  minutes after injection. CT data was obtained and used for attenuation correction and anatomic localization only.  (This was not acquired as a diagnostic CT examination.)  Fasting Blood Glucose:  97  Patient Weight:  205 pounds.  Comparison:  None  Findings: No hypermetabolic lymph nodes  within the soft tissues of the neck.  No hypermetabolic supraclavicular or axillary lymph nodes.  There are no hypermetabolic mediastinal or hilar lymph nodes.  No pericardial effusion.  Small right pleural effusion is present.  No pulmonary nodules or masses identified.  Within the posterior right hepatic lobe there is a parenchymal focus of increased FDG uptake with an SUV max equal to 5.5.  Suspicious for liver metastases.  Corresponding low density structure on contrast enhanced CT is noted.  Spleen is negative.  The adrenal glands are normal.  Gallbladder is negative.  No biliary dilatation.  Normal appearance of the pancreas.  Normal appearance of the right kidney.  There is a left-sided hydronephrosis and hydroureter as before.  No hypermetabolic retroperitoneal lymph nodes.   Bilateral hypermetabolic cystic adnexal masses are identified. Mass within the right adnexa measures 3.8 by 4.2 cm and has an SUV max equal to 16.5.  Large solid and cystic mass within the pelvis measures 8.2 by 11.0 by 8.7 cm.  This has an SUV max equal to 17.0.  No hypermetabolic inguinal adenopathy.  Peri umbilical anterior abdominal wall metastasis is identified measuring 2.4 cm with an SUV max equal to 11.8.   Extensive peri hepatic peritoneal disease is identified.  Mass along the dome of the right hepatic lobe measures 5.1 x 5.8.  The SUV max is equal to 12.4.  Hypermetabolic tumor along the posterior lateral aspect the right hepatic lobe has an SUV max equal to 9.7.  No evidence for bowel obstruction.  No specific features to suggest hypermetabolic bone metastasis.  IMPRESSION:  1.  Extensive hypermetabolic tumor including metastasis to the periumbilical anterior abdominal wall, perihepatic peritoneal surface, and right hepatic lobe. 2.  Large central pelvic soft tissue mass as detailed above. 3.  Bilateral hypermetabolic adnexal masses. 4.  Left-sided hydronephrosis and hydroureter.  Secondary to pelvic process. 5.  Right  pleural effusion.  Original Report Authenticated By: Angelita Ingles, M.D.      IMPRESSIONS AND PLAN: A 75 y.o. female with   #1 new diagnosis of what looks like ovarian and endometrial cancer. She most likely has ovarian cancer although endometrial cancer is still in the differential.  #2 I will plan on referring her to gyne/onc-for consultation.  #3 patient will need chemotherapy recommendation is carboplatinum and Taxol regimen given every 21 days. Risks and benefits of the treatment were discussed with the patient as well as her family. She will need a Port-A-Cath placed and I have recommended that she see Dr. Zella Richer for this. As well as chemotherapy teaching class.  Spent more than 50%of  time coordinating care and counseling.    Marcy Panning, MD Medical/Oncology [facility] [phone] (beeper) [phone] (Office)  10/16/2011, 4:20 PM 11/6/20124:20 PM

## 2011-10-17 ENCOUNTER — Encounter: Payer: Self-pay | Admitting: *Deleted

## 2011-10-17 NOTE — Progress Notes (Signed)
Erroneous encounter-disregard

## 2011-10-18 ENCOUNTER — Other Ambulatory Visit: Payer: Self-pay | Admitting: Oncology

## 2011-10-18 ENCOUNTER — Encounter (HOSPITAL_COMMUNITY): Payer: Self-pay | Admitting: Pharmacy Technician

## 2011-10-18 ENCOUNTER — Other Ambulatory Visit (INDEPENDENT_AMBULATORY_CARE_PROVIDER_SITE_OTHER): Payer: Self-pay | Admitting: General Surgery

## 2011-10-19 ENCOUNTER — Other Ambulatory Visit (HOSPITAL_COMMUNITY): Payer: Medicare Other

## 2011-10-19 LAB — CULTURE, BLOOD (ROUTINE X 2)
Culture  Setup Time: 201211030345
Culture: NO GROWTH
Culture: NO GROWTH

## 2011-10-20 NOTE — H&P (Signed)
Nancy Downs, KULT NO.:  [id number]  MEDICAL RECORD NO.:  [id number]  LOCATION:  [facility]                         FACILITY:  [facility]  PHYSICIAN:  Jana Hakim, M.D. DATE OF BIRTH:  Aug 20, 1936  DATE OF ADMISSION:  10/12/2011 DATE OF DISCHARGE:                             HISTORY & PHYSICAL   DATE OF ADMISSION:  October 13, 2011.  PRIMARY CARE PHYSICIAN:  Orthoptist, Dr. Humphrey Rolls.  CHIEF COMPLAINT:  Fever.  HISTORY OF PRESENT ILLNESS:  This is a 75 year old female, who has been undergoing an evaluation this past week for a possible metastatic cancer of unknown origin.  She underwent a PET scan 4 days ago.  Then, on the next day, had a biopsy performed of a nodule in the supraumbilical area of the abdomen and 1 day ago, she began to have worsening fevers, chills, temperatures to 101, along with increased redness across her abdomen.  Her abdominal area has been tender as well.  She has also had poor appetite.  Denies having any nausea, vomiting, diarrhea, or constipation.  She does report having black stools, but reports that she had been placed on iron for the past week after being found to be anemic.  Patient contacted the on-call oncologist and was advised to report to the emergency department for further evaluation this past evening.  PAST MEDICAL HISTORY:  Significant for, 1. Stage III cancer of the uterus in 1977. 2. Also, a history of skin cancer, which was squamous cell carcinoma,     status post excisions on the head and leg. 3. Hyperlipidemia. 4. Anemia. 5. Depression.  PAST SURGICAL HISTORY:  History of a hysterectomy.  MEDICATIONS:  Include Celexa and lovastatin.  ALLERGIES:  No known drug allergies.  SOCIAL HISTORY:  Patient is retired.  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 She denies any tobacco usage, but reports having history of secondhand smoke exposure for  many years secondary to her work and secondary to her significant other.  She denies any alcohol usage.  She denies any illicit drug usage.  FAMILY HISTORY:  Strongly positive for cancers.  Mother died of ovarian and breast cancer.  She had 2 maternal aunts with breast cancer, and her father had lung cancer and was a smoker.  REVIEW OF SYSTEMS:  Pertinent as mentioned above.  PHYSICAL EXAMINATION FINDINGS:  GENERAL:  This is a pleasant 75 year old, well-nourished, well-developed, Caucasian female, who is in no acute distress. VITAL SIGNS;  Temperature 100.5 orally, blood pressure 146/75, heart rate 103, respirations 18, O2 saturations 100%. HEENT:  Normocephalic, atraumatic.  Pupils equally round, reactive to light.  Extraocular movements are intact.  Funduscopic benign.  There is no scleral icterus.  Nares are patent bilaterally.  Oropharynx is clear. NECK:  Supple.  Full range of motion.  No thyromegaly, adenopathy, or jugular venous distention. CARDIOVASCULAR:  Regular rate and rhythm with mild tachycardia.  Nomurmurs, gallops, rubs appreciated. LUNGS:  Clear to auscultation bilaterally.  No rales, rhonchi, or wheezes. ABDOMEN: Confluent erythema across the central abdomen, tender to palpation.  Positive bowel sounds, soft, mildly distended.  No hepatosplenomegaly. EXTREMITIES:  Without cyanosis, clubbing, or edema. NEUROLOGIC:  Nonfocal.  LABORATORY STUDIES:  White blood cell count 8.0, hemoglobin 10.1, hematocrit 31.1, platelets 294, neutrophils 74%, lymphocytes 16%. Protime 13.0, INR 0.96, and lactic acid 1.1.  Sodium 134, potassium 3.9, chloride 99, CO2 24, BUN 13, creatinine 0.64, glucose 100, lipase 31. AST 34, ALT 45, albumin 3.7.  Chest x-ray reveals a normal sized heart, minimal linear atelectasis at the left lateral lung base, otherwise clear lung fields.  Mild scoliosis present.  Diffuse osteopenia, stable minimal mid to lower thoracic vertebral wedge deformity  which sounds like a possible compression fracture.  ASSESSMENT:  75 year old female being admitted with, 1. Fever/febrile illness. 2. Cellulitis of the abdominal area. 3. Metastatic cancer of unknown origin. 4. Normocytic anemia.  PLAN:  Patient will be admitted and blood cultures have been ordered, and patient has been placed on an IV antibiotic therapy of vancomycin, and Zosyn therapy will also be added.  IV fluids have been ordered for fluid resuscitation and maintenance therapy.  Antipyretic therapy has also been ordered.  An anemia panel will also be ordered as well. Patient's regular medications will be further reconciled.  Oncology will be notified of patient's admission, to consult in the a.m.  Patient will be placed on DVT prophylaxis and further workup will ensue pending results of patient's clinical course.     Jana Hakim, M.D.     HJ/MEDQ  D:  10/13/2011  T:  10/13/2011  Job:  [id number]  Electronically Signed by Jana Hakim M.D. on 10/20/2011 01:33:42 AM

## 2011-10-22 ENCOUNTER — Ambulatory Visit (HOSPITAL_COMMUNITY): Admission: RE | Admit: 2011-10-22 | Payer: Medicare Other | Source: Ambulatory Visit | Admitting: General Surgery

## 2011-10-22 ENCOUNTER — Encounter (HOSPITAL_COMMUNITY): Admission: RE | Payer: Self-pay | Source: Ambulatory Visit

## 2011-10-22 ENCOUNTER — Encounter (HOSPITAL_COMMUNITY): Payer: Self-pay | Admitting: *Deleted

## 2011-10-22 DIAGNOSIS — C569 Malignant neoplasm of unspecified ovary: Secondary | ICD-10-CM | POA: Insufficient documentation

## 2011-10-22 SURGERY — INSERTION, TUNNELED CENTRAL VENOUS DEVICE, WITH PORT
Anesthesia: Choice

## 2011-10-23 ENCOUNTER — Ambulatory Visit (HOSPITAL_COMMUNITY): Payer: Medicare Other

## 2011-10-23 ENCOUNTER — Encounter (HOSPITAL_COMMUNITY)
Admission: RE | Disposition: A | Discharge status: Home / Self Care | Payer: Self-pay | Source: Ambulatory Visit | Attending: General Surgery

## 2011-10-23 ENCOUNTER — Ambulatory Visit (HOSPITAL_COMMUNITY)
Admission: RE | Admit: 2011-10-23 | Discharge: 2011-10-23 | Disposition: A | Discharge status: Home / Self Care | Payer: Medicare Other | Source: Ambulatory Visit | Attending: General Surgery | Admitting: General Surgery

## 2011-10-23 ENCOUNTER — Ambulatory Visit (HOSPITAL_COMMUNITY): Payer: Medicare Other | Admitting: Anesthesiology

## 2011-10-23 ENCOUNTER — Encounter (HOSPITAL_COMMUNITY): Payer: Self-pay | Admitting: *Deleted

## 2011-10-23 ENCOUNTER — Other Ambulatory Visit (INDEPENDENT_AMBULATORY_CARE_PROVIDER_SITE_OTHER): Payer: Self-pay | Admitting: General Surgery

## 2011-10-23 ENCOUNTER — Encounter (HOSPITAL_COMMUNITY): Payer: Self-pay | Admitting: Anesthesiology

## 2011-10-23 DIAGNOSIS — C801 Malignant (primary) neoplasm, unspecified: Secondary | ICD-10-CM

## 2011-10-23 DIAGNOSIS — C569 Malignant neoplasm of unspecified ovary: Secondary | ICD-10-CM | POA: Insufficient documentation

## 2011-10-23 HISTORY — PX: PORTACATH PLACEMENT: SHX2246

## 2011-10-23 LAB — SURGICAL PCR SCREEN: MRSA, PCR: NEGATIVE

## 2011-10-23 LAB — CBC
HCT: 35 % — ABNORMAL LOW (ref 36.0–46.0)
Hemoglobin: 11.4 g/dL — ABNORMAL LOW (ref 12.0–15.0)
MCH: 28.6 pg (ref 26.0–34.0)
MCHC: 32.6 g/dL (ref 30.0–36.0)

## 2011-10-23 LAB — BASIC METABOLIC PANEL
BUN: 22 mg/dL (ref 6–23)
CO2: 23 mEq/L (ref 19–32)
Calcium: 9.8 mg/dL (ref 8.4–10.5)
Creatinine, Ser: 0.9 mg/dL (ref 0.50–1.10)
GFR calc non Af Amer: 62 mL/min — ABNORMAL LOW (ref >90)
Glucose, Bld: 104 mg/dL — ABNORMAL HIGH (ref 70–99)
Sodium: 138 mEq/L (ref 135–145)

## 2011-10-23 LAB — PROTIME-INR: INR: 1.01 (ref 0.00–1.49)

## 2011-10-23 SURGERY — INSERTION, TUNNELED CENTRAL VENOUS DEVICE, WITH PORT
Anesthesia: Monitor Anesthesia Care | Laterality: Right | Wound class: Clean

## 2011-10-23 MED ORDER — LIDOCAINE HCL (PF) 2 % IJ SOLN
INTRAMUSCULAR | Status: DC | PRN
  Administered 2011-10-23: 20 mg

## 2011-10-23 MED ORDER — FENTANYL CITRATE 0.05 MG/ML IJ SOLN
INTRAMUSCULAR | Status: DC | PRN
  Administered 2011-10-23: 50 ug via INTRAVENOUS

## 2011-10-23 MED ORDER — PROPOFOL 10 MG/ML IV EMUL
INTRAVENOUS | Status: DC | PRN
  Administered 2011-10-23: 75 ug/kg/min via INTRAVENOUS

## 2011-10-23 MED ORDER — MIDAZOLAM HCL 5 MG/5ML IJ SOLN
INTRAMUSCULAR | Status: DC | PRN
  Administered 2011-10-23: 1 mg via INTRAVENOUS

## 2011-10-23 MED ORDER — SODIUM CHLORIDE 0.9 % IR SOLN
Status: DC | PRN
  Administered 2011-10-23: 13:00:00

## 2011-10-23 MED ORDER — PROMETHAZINE HCL 25 MG/ML IJ SOLN: 6.2500 mg | INTRAMUSCULAR | Status: DC | PRN

## 2011-10-23 MED ORDER — CEFAZOLIN SODIUM 1-5 GM-% IV SOLN
INTRAVENOUS | Status: DC | PRN
  Administered 2011-10-23: 1 g via INTRAVENOUS

## 2011-10-23 MED ORDER — HEPARIN SOD (PORK) LOCK FLUSH 100 UNIT/ML IV SOLN
INTRAVENOUS | Status: DC | PRN
  Administered 2011-10-23: 250 [IU] via INTRAVENOUS

## 2011-10-23 MED ORDER — HYDROMORPHONE HCL PF 1 MG/ML IJ SOLN
0.2500 mg | INTRAMUSCULAR | Status: DC | PRN
  Administered 2011-10-23 (×2): 0.5 mg via INTRAVENOUS

## 2011-10-23 MED ORDER — LIDOCAINE HCL (PF) 1 % IJ SOLN
INTRAMUSCULAR | Status: DC | PRN
  Administered 2011-10-23: 10 mL via SUBCUTANEOUS

## 2011-10-23 MED ORDER — MIDAZOLAM HCL 2 MG/2ML IJ SOLN: 0.5000 mg | Freq: Once | INTRAMUSCULAR | Status: DC | PRN

## 2011-10-23 MED ORDER — ONDANSETRON HCL 4 MG/2ML IJ SOLN
INTRAMUSCULAR | Status: DC | PRN
  Administered 2011-10-23: 4 mg via INTRAVENOUS

## 2011-10-23 MED ORDER — MORPHINE SULFATE 2 MG/ML IJ SOLN: 0.0500 mg/kg | INTRAMUSCULAR | Status: DC | PRN

## 2011-10-23 MED ORDER — MEPERIDINE HCL 25 MG/ML IJ SOLN: 6.2500 mg | INTRAMUSCULAR | Status: DC | PRN

## 2011-10-23 MED ORDER — LACTATED RINGERS IV SOLN
INTRAVENOUS | Status: DC
  Administered 2011-10-23: 12:00:00 via INTRAVENOUS

## 2011-10-23 MED ORDER — MUPIROCIN 2 % EX OINT
TOPICAL_OINTMENT | Freq: Two times a day (BID) | CUTANEOUS | Status: DC
  Administered 2011-10-23: 10:00:00 via NASAL
  Filled 2011-10-23: qty 22

## 2011-10-23 SURGICAL SUPPLY — 56 items
APL SKNCLS STERI-STRIP NONHPOA (GAUZE/BANDAGES/DRESSINGS) ×1
BAG DECANTER FOR FLEXI CONT (MISCELLANEOUS) ×2 IMPLANT
BENZOIN TINCTURE PRP APPL 2/3 (GAUZE/BANDAGES/DRESSINGS) ×2 IMPLANT
BLADE SURG 11 STRL SS (BLADE) ×2 IMPLANT
BLADE SURG 15 STRL LF DISP TIS (BLADE) ×1 IMPLANT
BLADE SURG 15 STRL SS (BLADE) ×2
CHLORAPREP W/TINT 26ML (MISCELLANEOUS) ×2 IMPLANT
CLEANER TIP ELECTROSURG 2X2 (MISCELLANEOUS) ×2 IMPLANT
CLOTH BEACON ORANGE TIMEOUT ST (SAFETY) ×2 IMPLANT
COVER PROBE W GEL 5X96 (DRAPES) IMPLANT
COVER SURGICAL LIGHT HANDLE (MISCELLANEOUS) ×2 IMPLANT
DECANTER SPIKE VIAL GLASS SM (MISCELLANEOUS) ×2 IMPLANT
DRAPE C-ARM 42X72 X-RAY (DRAPES) ×2 IMPLANT
DRAPE LAPAROSCOPIC ABDOMINAL (DRAPES) ×2 IMPLANT
DRSG OPSITE 4X5.5 SM (GAUZE/BANDAGES/DRESSINGS) ×2 IMPLANT
DRSG OPSITE 6X11 MED (GAUZE/BANDAGES/DRESSINGS) ×2 IMPLANT
ELECT REM PT RETURN 9FT ADLT (ELECTROSURGICAL) ×2
ELECTRODE REM PT RTRN 9FT ADLT (ELECTROSURGICAL) ×1 IMPLANT
GAUZE SPONGE 2X2 8PLY STRL LF (GAUZE/BANDAGES/DRESSINGS) ×1 IMPLANT
GAUZE SPONGE 4X4 16PLY XRAY LF (GAUZE/BANDAGES/DRESSINGS) ×2 IMPLANT
GLOVE BIOGEL PI IND STRL 6.5 (GLOVE) ×2 IMPLANT
GLOVE BIOGEL PI IND STRL 8 (GLOVE) ×1 IMPLANT
GLOVE BIOGEL PI INDICATOR 6.5 (GLOVE) ×2
GLOVE BIOGEL PI INDICATOR 8 (GLOVE) ×1
GLOVE ECLIPSE 6.5 STRL STRAW (GLOVE) ×2 IMPLANT
GLOVE ECLIPSE 8.0 STRL XLNG CF (GLOVE) ×2 IMPLANT
GOWN STRL NON-REIN LRG LVL3 (GOWN DISPOSABLE) ×4 IMPLANT
INTRODUCER 13FR (MISCELLANEOUS) IMPLANT
INTRODUCER COOK 11FR (CATHETERS) IMPLANT
KIT BASIN OR (CUSTOM PROCEDURE TRAY) ×2 IMPLANT
KIT POWER CATH 8FR (Catheter) ×1 IMPLANT
KIT ROOM TURNOVER OR (KITS) ×2 IMPLANT
NEEDLE 22X1 1/2 (OR ONLY) (NEEDLE) ×2 IMPLANT
NEEDLE HYPO 25GX1X1/2 BEV (NEEDLE) ×2 IMPLANT
NS IRRIG 1000ML POUR BTL (IV SOLUTION) ×2 IMPLANT
PACK SURGICAL SETUP 50X90 (CUSTOM PROCEDURE TRAY) ×2 IMPLANT
PAD ARMBOARD 7.5X6 YLW CONV (MISCELLANEOUS) ×2 IMPLANT
PENCIL BUTTON HOLSTER BLD 10FT (ELECTRODE) ×2 IMPLANT
SET INTRODUCER 12FR PACEMAKER (SHEATH) IMPLANT
SET SHEATH INTRODUCER 10FR (MISCELLANEOUS) IMPLANT
SHEATH COOK PEEL AWAY SET 9F (SHEATH) IMPLANT
SPONGE GAUZE 2X2 STER 10/PKG (GAUZE/BANDAGES/DRESSINGS) ×1
STRIP CLOSURE SKIN 1/4X4 (GAUZE/BANDAGES/DRESSINGS) ×2 IMPLANT
SUT MON AB 4-0 PC3 18 (SUTURE) ×2 IMPLANT
SUT VIC AB 2-0 SH 18 (SUTURE) ×2 IMPLANT
SUT VIC AB 3-0 54X BRD REEL (SUTURE) IMPLANT
SUT VIC AB 3-0 BRD 54 (SUTURE)
SUT VIC AB 3-0 SH 27 (SUTURE)
SUT VIC AB 3-0 SH 27X BRD (SUTURE) IMPLANT
SYR 20ML ECCENTRIC (SYRINGE) ×4 IMPLANT
SYR 5ML LUER SLIP (SYRINGE) ×2 IMPLANT
SYR CONTROL 10ML LL (SYRINGE) ×2 IMPLANT
SYRINGE 10CC LL (SYRINGE) ×2 IMPLANT
TOWEL OR 17X24 6PK STRL BLUE (TOWEL DISPOSABLE) ×2 IMPLANT
TOWEL OR 17X26 10 PK STRL BLUE (TOWEL DISPOSABLE) ×2 IMPLANT
WATER STERILE IRR 1000ML POUR (IV SOLUTION) IMPLANT

## 2011-10-23 NOTE — Interval H&P Note (Signed)
History and Physical Interval Note:   10/23/2011   12:01 PM   Nancy Downs  has presented today for surgery, with the diagnosis of Liver Cancer  The various methods of treatment have been discussed with the patient and family. After consideration of risks, benefits and other options for treatment, the patient has consented to  Procedure(s): INSERTION PORT-A-CATH as a surgical intervention .  The patients' history has been reviewed, patient examined, no change in status, stable for surgery.  I have reviewed the patients' chart and labs.  Questions were answered to the patient's satisfaction.     Odis Hollingshead  MD

## 2011-10-23 NOTE — Anesthesia Postprocedure Evaluation (Signed)
  Anesthesia Post-op Note  Patient: Nancy Downs  Procedure(s) Performed:  INSERTION PORT-A-CATH - insertion right internal jugular port-a-cath  Patient Location: PACU  Anesthesia Type: MAC  Level of Consciousness: awake  Airway and Oxygen Therapy: Patient Spontanous Breathing  Post-op Pain: mild  Post-op Assessment: Post-op Vital signs reviewed  Post-op Vital Signs: stable  Complications: No apparent anesthesia complications

## 2011-10-23 NOTE — Anesthesia Preprocedure Evaluation (Addendum)
Anesthesia Evaluation  Patient identified by MRN, date of birth, ID band Patient awake    Reviewed: Allergy & Precautions, H&P , NPO status   Airway Mallampati: I TM Distance: >3 FB Neck ROM: Full    Dental  (+) Teeth Intact   Pulmonary  clear to auscultation        Cardiovascular Regular     Neuro/Psych    GI/Hepatic Liver ca   Endo/Other    Renal/GU    Ovarian ca    Musculoskeletal   Abdominal   Peds  Hematology   Anesthesia Other Findings   Reproductive/Obstetrics                         Anesthesia Physical Anesthesia Plan  ASA: III  Anesthesia Plan: MAC   Post-op Pain Management:    Induction: Intravenous  Airway Management Planned: Simple Face Mask  Additional Equipment:   Intra-op Plan:   Post-operative Plan:   Informed Consent: I have reviewed the patients History and Physical, chart, labs and discussed the procedure including the risks, benefits and alternatives for the proposed anesthesia with the patient or authorized representative who has indicated his/her understanding and acceptance.   Dental advisory given  Plan Discussed with: CRNA  Anesthesia Plan Comments:         Anesthesia Quick Evaluation

## 2011-10-23 NOTE — H&P (Signed)
Diagnoses     Umbilical mass   - Primary    789.39      Reason for Visit     Other    new pt- eval of umbilical hernia             H & P  HPI Nancy Downs is a 75 y.o. female.   HPI She is self-referred for which she feels is an umbilical hernia. She noticed a nodule in the umbilicus about 2-3 months ago. He was also noted by her nurse practitioner. It doesn't cause her pain and less somebody touches it. No nausea or vomiting. She has had a recent fever and cough and is being treated for bronchitis currently. No significant change in her bowel habits. No obstructive symptoms. She has noted some discoloration of the umbilicus.  A CT of the abdomen and pelvis  demonstrated findings consistent with widely metastatic intraabdominal disease.  Image guided needle biopsies are c/w a gynecologic primary.  She is here for a Portacath insertion. Past Medical History   Diagnosis  Date   .  Hyperlipidemia     .  Cancer     .  Arthritis     .  Anemia     .  Fever     .  Cough     .  Constipation     .  Easy bruising     .  Fatigue     .  Bronchitis         Past Surgical History   Procedure  Date   .  Tonsillectomy and adenoidectomy  1945   .  Appendectomy  1966   .  Spider vein injection  1968       varicose veins stripped    .  Abdominal hysterectomy  1977       uterine cancer    .  Ankle surgery  1996       broken ankle plates and pins put in place    .  Knee surgery  2004/2012       Family History   Problem  Relation  Age of Onset   .  Cancer  Mother  95       ovarian    .  Cancer  Father  14       lung cancer      Social History History   Substance Use Topics   .  Smoking status:  Never Smoker    .  Smokeless tobacco:  Never Used   .  Alcohol Use:  Yes      No Known Allergies    Current Outpatient Prescriptions   Medications reviewed.         Review of Systems Review of Systems  Constitutional: Positive for fever.  Respiratory: Positive  for cough.   Cardiovascular: Negative.   Gastrointestinal:        As per history of present illness.    Blood pressure 116/84, pulse 64, temperature 96.3 F (35.7 C), resp. rate 20, height 5' 7.5" (1.715 m), weight 205 lb 6 oz (93.157 kg).   Physical Exam Physical Exam  Constitutional: No distress.       Overweight.  Neck: Neck supple.  Cardiovascular: Normal rate and regular rhythm.    No murmur heard. Pulmonary/Chest: Effort normal and breath sounds normal. She has no wheezes. She has no rales.  Abdominal: Soft. Bowel sounds are normal.  Slightly tender firm umbilical nodule with mild bluish discoloration accentuated when she stands. It is not reducible. No drainage.  Musculoskeletal: She exhibits edema.  Lymphadenopathy:    She has no cervical adenopathy.    Data Reviewed none Assessment Metastatic cancer.   Plan:  Portacath insertion.  The procedure risks and aftercare been explained. Risks include but are not limited to bleeding, infection, malfunction, pneumothorax, wound problems, DVT.             Tunis Gentle J 09/25/2011, 2:24 PM                Not recorded       Discontinued Medications         Reason for Discontinue    amoxicillin (AMOXIL) 500 MG capsule Error    doxycycline (VIBRAMYCIN) 100 MG capsule Error    valACYclovir (VALTREX) 1000 MG tablet Error           Patient Instructions     We will call you with the results. Your lung infection will need to be cleared before we consider any surgery.       Level of Service     PR OFFICE/OUTPT VISIT,NEW,LEVL III [id number]      Follow-up and Disposition     Return if symptoms worsen or fail to improve.        All Flowsheet Templates (all recorded)     Encounter Vitals Flowsheet    Custom Formula Data Flowsheet    Anthropometrics Flowsheet               Referring Provider          Margarita Rana       All Charges for This Encounter       Code  Description Service Date Service Provider Modifiers Quantity    [phone] PR OFFICE/OUTPT VISIT,NEW,LEVL III 09/25/2011 Odis Hollingshead, MD   1    [phone] PR PT VIS [facility] USE EHR CER ATCB 09/25/2011 Odis Hollingshead, MD   1    [phone] PR CURRENT TOBACCO NON-USER 09/25/2011 Odis Hollingshead, MD   1        Other Encounter Related Information     Allergies & Medications         Problem List         History         Patient-Entered Questionnaires     No data filed

## 2011-10-23 NOTE — Preoperative (Signed)
Beta Blockers   Reason not to administer Beta Blockers:Not Applicable 

## 2011-10-23 NOTE — Transfer of Care (Signed)
Immediate Anesthesia Transfer of Care Note  Patient: Nancy Downs  Procedure(s) Performed:  INSERTION PORT-A-CATH - insertion right internal jugular port-a-cath  Patient Location: PACU  Anesthesia Type: MAC  Level of Consciousness: awake, alert , oriented and patient cooperative  Airway & Oxygen Therapy: Patient Spontanous Breathing and Patient connected to nasal cannula oxygen  Post-op Assessment: Report given to PACU RN  Post vital signs: Reviewed and stable  Complications: No apparent anesthesia complications

## 2011-10-23 NOTE — Op Note (Signed)
Preoperative diagnosis:   Ovarian cancer  Postoperative diagnosis:  Same  Procedure: Ultrasound-guided Port-A-Cath insertion into right internal jugular vein under fluoroscopy.  Surgeon: Jackolyn Confer M.D.  Anesthesia: Local (Xylocaine) with MAC.  Indication:  This is a 75 year old female found to have stage IV ovarian cancer.  She requires long term venous access for chemotherapy and now presents for Portacath insertion.  Technique: The patient was brought to the operating room, placed supine on the operating table, and intravenous sedation was given. A roll was placed under the back and the arms were tucked. Hair on the chest wall was clipped as was necessary. The upper chest wall and neck were sterilely prepped and draped.  The patient's head was rotated to the left. Using the ultrasound, the right internal jugular vein was identified. Local anesthetic was infiltrated in the skin and subcutaneous tissue just anterior to it. A 16-gauge needle was used to cannulate the right internal jugular vein under ultrasound guidance. A wire was then threaded through the needle into the internal jugular vein and down into the right heart under ultrasound and fluoroscopic guidance.  Local anesthetic was infiltrated into the right upper chest wall. A right upper chest wall incision was made and a pocket was created for the Portacath.  An incision was made around the wire in the neck. The catheter was then tunneled from the chest wall incision up through the neck incision.  A dilator- introducer complex was placed over the wire into the superior vena cava. The dilator and wire were then removed and the catheter was threaded through the peel-away sheath introducer into the right heart. The introducer was then peeled away and removed. Under fluoroscopic guidance, the tip of the catheter was then pulled back until it was at the junction of the superior vena cava and right atrium. The catheter was then connected to  the port.  The port aspirated blood and flushed easily.  The port was then anchored to the chest wall with 2-0 Vicryl suture. Concentrated heparin solution was then placed into the port. The port and catheter position were then verified using fluoroscopy. The subcutaneous tissue was then closed over the port with running 3-0 Vicryl suture. The skin incisions were then closed with 4-0 Monocryl subcuticular stitches. Steri-Strips and sterile dressings were applied.  The procedure was well-tolerated without any apparent complications. The patient was taken to the recovery room in satisfactory condition where a portable chest x-ray is pending.

## 2011-10-24 ENCOUNTER — Telehealth: Payer: Self-pay | Admitting: *Deleted

## 2011-10-24 ENCOUNTER — Encounter: Payer: Self-pay | Admitting: *Deleted

## 2011-10-24 NOTE — Telephone Encounter (Signed)
Pt's Daughter, Richardson Dopp called to advise she and pt have decided her care will be at [facility].  Appt for 10/30/11 to be cancelled.  POF (ONC Treatment Schedule) to scheduler to cancel future appts per Shari [facility] (pt's Daughter) request

## 2011-10-30 ENCOUNTER — Telehealth: Payer: Self-pay | Admitting: Oncology

## 2011-10-30 ENCOUNTER — Ambulatory Visit: Payer: Medicare Other | Admitting: Oncology

## 2011-10-30 ENCOUNTER — Encounter (HOSPITAL_COMMUNITY): Payer: Self-pay | Admitting: General Surgery

## 2011-10-30 NOTE — Telephone Encounter (Signed)
No chemo schedule in system as of 10/30/11.

## 2011-12-20 DIAGNOSIS — Z5111 Encounter for antineoplastic chemotherapy: Secondary | ICD-10-CM | POA: Diagnosis not present

## 2011-12-20 DIAGNOSIS — C569 Malignant neoplasm of unspecified ovary: Secondary | ICD-10-CM | POA: Diagnosis not present

## 2011-12-21 DIAGNOSIS — R933 Abnormal findings on diagnostic imaging of other parts of digestive tract: Secondary | ICD-10-CM | POA: Diagnosis not present

## 2011-12-21 DIAGNOSIS — R911 Solitary pulmonary nodule: Secondary | ICD-10-CM | POA: Diagnosis not present

## 2011-12-21 DIAGNOSIS — R935 Abnormal findings on diagnostic imaging of other abdominal regions, including retroperitoneum: Secondary | ICD-10-CM | POA: Diagnosis not present

## 2011-12-21 DIAGNOSIS — J984 Other disorders of lung: Secondary | ICD-10-CM | POA: Diagnosis not present

## 2011-12-21 DIAGNOSIS — N9489 Other specified conditions associated with female genital organs and menstrual cycle: Secondary | ICD-10-CM | POA: Diagnosis not present

## 2011-12-21 DIAGNOSIS — C569 Malignant neoplasm of unspecified ovary: Secondary | ICD-10-CM | POA: Diagnosis not present

## 2011-12-21 DIAGNOSIS — R599 Enlarged lymph nodes, unspecified: Secondary | ICD-10-CM | POA: Diagnosis not present

## 2011-12-27 DIAGNOSIS — Z5111 Encounter for antineoplastic chemotherapy: Secondary | ICD-10-CM | POA: Diagnosis not present

## 2011-12-27 DIAGNOSIS — C569 Malignant neoplasm of unspecified ovary: Secondary | ICD-10-CM | POA: Diagnosis not present

## 2012-01-01 DIAGNOSIS — Z79899 Other long term (current) drug therapy: Secondary | ICD-10-CM | POA: Diagnosis not present

## 2012-01-01 DIAGNOSIS — C569 Malignant neoplasm of unspecified ovary: Secondary | ICD-10-CM | POA: Diagnosis not present

## 2012-01-01 DIAGNOSIS — Z5111 Encounter for antineoplastic chemotherapy: Secondary | ICD-10-CM | POA: Diagnosis not present

## 2012-01-08 ENCOUNTER — Telehealth: Payer: Self-pay | Admitting: Oncology

## 2012-01-08 NOTE — Telephone Encounter (Signed)
S/w pt re genetics referral and per pt she already did this at [place]. Also per note in referral comment DO NOT SCHEDULE.

## 2012-01-10 DIAGNOSIS — C569 Malignant neoplasm of unspecified ovary: Secondary | ICD-10-CM | POA: Diagnosis not present

## 2012-01-10 DIAGNOSIS — Z5111 Encounter for antineoplastic chemotherapy: Secondary | ICD-10-CM | POA: Diagnosis not present

## 2012-01-16 ENCOUNTER — Ambulatory Visit: Payer: Medicare Other | Admitting: Oncology

## 2012-01-17 DIAGNOSIS — C569 Malignant neoplasm of unspecified ovary: Secondary | ICD-10-CM | POA: Diagnosis not present

## 2012-01-17 DIAGNOSIS — Z5111 Encounter for antineoplastic chemotherapy: Secondary | ICD-10-CM | POA: Diagnosis not present

## 2012-01-21 DIAGNOSIS — Z5111 Encounter for antineoplastic chemotherapy: Secondary | ICD-10-CM | POA: Diagnosis not present

## 2012-01-21 DIAGNOSIS — C569 Malignant neoplasm of unspecified ovary: Secondary | ICD-10-CM | POA: Diagnosis not present

## 2012-01-31 DIAGNOSIS — Z5111 Encounter for antineoplastic chemotherapy: Secondary | ICD-10-CM | POA: Diagnosis not present

## 2012-01-31 DIAGNOSIS — C569 Malignant neoplasm of unspecified ovary: Secondary | ICD-10-CM | POA: Diagnosis not present

## 2012-02-05 DIAGNOSIS — C569 Malignant neoplasm of unspecified ovary: Secondary | ICD-10-CM | POA: Diagnosis not present

## 2012-02-05 DIAGNOSIS — Z5111 Encounter for antineoplastic chemotherapy: Secondary | ICD-10-CM | POA: Diagnosis not present

## 2012-02-08 DIAGNOSIS — D61810 Antineoplastic chemotherapy induced pancytopenia: Secondary | ICD-10-CM | POA: Diagnosis not present

## 2012-02-08 DIAGNOSIS — R509 Fever, unspecified: Secondary | ICD-10-CM | POA: Diagnosis not present

## 2012-02-08 DIAGNOSIS — C569 Malignant neoplasm of unspecified ovary: Secondary | ICD-10-CM | POA: Diagnosis not present

## 2012-02-08 DIAGNOSIS — R112 Nausea with vomiting, unspecified: Secondary | ICD-10-CM | POA: Diagnosis not present

## 2012-02-08 DIAGNOSIS — R197 Diarrhea, unspecified: Secondary | ICD-10-CM | POA: Diagnosis not present

## 2012-02-08 DIAGNOSIS — R0602 Shortness of breath: Secondary | ICD-10-CM | POA: Diagnosis not present

## 2012-02-08 DIAGNOSIS — C7889 Secondary malignant neoplasm of other digestive organs: Secondary | ICD-10-CM | POA: Diagnosis not present

## 2012-02-08 DIAGNOSIS — C787 Secondary malignant neoplasm of liver and intrahepatic bile duct: Secondary | ICD-10-CM | POA: Diagnosis not present

## 2012-02-08 DIAGNOSIS — I498 Other specified cardiac arrhythmias: Secondary | ICD-10-CM | POA: Diagnosis not present

## 2012-02-08 DIAGNOSIS — T451X5A Adverse effect of antineoplastic and immunosuppressive drugs, initial encounter: Secondary | ICD-10-CM | POA: Diagnosis not present

## 2012-02-08 DIAGNOSIS — R1110 Vomiting, unspecified: Secondary | ICD-10-CM | POA: Diagnosis not present

## 2012-02-10 DIAGNOSIS — R197 Diarrhea, unspecified: Secondary | ICD-10-CM | POA: Diagnosis not present

## 2012-02-10 DIAGNOSIS — D61810 Antineoplastic chemotherapy induced pancytopenia: Secondary | ICD-10-CM | POA: Diagnosis not present

## 2012-02-10 DIAGNOSIS — R112 Nausea with vomiting, unspecified: Secondary | ICD-10-CM | POA: Diagnosis not present

## 2012-02-10 DIAGNOSIS — R509 Fever, unspecified: Secondary | ICD-10-CM | POA: Diagnosis not present

## 2012-02-10 DIAGNOSIS — T451X5A Adverse effect of antineoplastic and immunosuppressive drugs, initial encounter: Secondary | ICD-10-CM | POA: Diagnosis not present

## 2012-02-10 DIAGNOSIS — Z86718 Personal history of other venous thrombosis and embolism: Secondary | ICD-10-CM | POA: Diagnosis not present

## 2012-02-10 DIAGNOSIS — C569 Malignant neoplasm of unspecified ovary: Secondary | ICD-10-CM | POA: Diagnosis not present

## 2012-02-10 DIAGNOSIS — I498 Other specified cardiac arrhythmias: Secondary | ICD-10-CM | POA: Diagnosis not present

## 2012-02-10 DIAGNOSIS — I82409 Acute embolism and thrombosis of unspecified deep veins of unspecified lower extremity: Secondary | ICD-10-CM | POA: Diagnosis not present

## 2012-02-10 DIAGNOSIS — K219 Gastro-esophageal reflux disease without esophagitis: Secondary | ICD-10-CM | POA: Diagnosis present

## 2012-02-18 DIAGNOSIS — Z5111 Encounter for antineoplastic chemotherapy: Secondary | ICD-10-CM | POA: Diagnosis not present

## 2012-02-18 DIAGNOSIS — C569 Malignant neoplasm of unspecified ovary: Secondary | ICD-10-CM | POA: Diagnosis not present

## 2012-02-18 DIAGNOSIS — M899 Disorder of bone, unspecified: Secondary | ICD-10-CM | POA: Diagnosis not present

## 2012-02-26 DIAGNOSIS — H5200 Hypermetropia, unspecified eye: Secondary | ICD-10-CM | POA: Diagnosis not present

## 2012-02-26 DIAGNOSIS — H269 Unspecified cataract: Secondary | ICD-10-CM | POA: Diagnosis not present

## 2012-02-26 DIAGNOSIS — H524 Presbyopia: Secondary | ICD-10-CM | POA: Diagnosis not present

## 2012-02-26 DIAGNOSIS — H52229 Regular astigmatism, unspecified eye: Secondary | ICD-10-CM | POA: Diagnosis not present

## 2012-02-28 DIAGNOSIS — C569 Malignant neoplasm of unspecified ovary: Secondary | ICD-10-CM | POA: Diagnosis not present

## 2012-02-28 DIAGNOSIS — Z5111 Encounter for antineoplastic chemotherapy: Secondary | ICD-10-CM | POA: Diagnosis not present

## 2012-03-03 DIAGNOSIS — J9819 Other pulmonary collapse: Secondary | ICD-10-CM | POA: Diagnosis not present

## 2012-03-03 DIAGNOSIS — C50919 Malignant neoplasm of unspecified site of unspecified female breast: Secondary | ICD-10-CM | POA: Diagnosis not present

## 2012-03-03 DIAGNOSIS — K7689 Other specified diseases of liver: Secondary | ICD-10-CM | POA: Diagnosis not present

## 2012-03-03 DIAGNOSIS — R918 Other nonspecific abnormal finding of lung field: Secondary | ICD-10-CM | POA: Diagnosis not present

## 2012-03-03 DIAGNOSIS — C569 Malignant neoplasm of unspecified ovary: Secondary | ICD-10-CM | POA: Diagnosis not present

## 2012-03-03 DIAGNOSIS — Z79899 Other long term (current) drug therapy: Secondary | ICD-10-CM | POA: Diagnosis not present

## 2012-03-06 DIAGNOSIS — C569 Malignant neoplasm of unspecified ovary: Secondary | ICD-10-CM | POA: Diagnosis not present

## 2012-03-06 DIAGNOSIS — Z5111 Encounter for antineoplastic chemotherapy: Secondary | ICD-10-CM | POA: Diagnosis not present

## 2012-03-17 DIAGNOSIS — F411 Generalized anxiety disorder: Secondary | ICD-10-CM | POA: Diagnosis not present

## 2012-03-17 DIAGNOSIS — J209 Acute bronchitis, unspecified: Secondary | ICD-10-CM | POA: Diagnosis not present

## 2012-03-17 DIAGNOSIS — Z23 Encounter for immunization: Secondary | ICD-10-CM | POA: Diagnosis not present

## 2012-03-19 DIAGNOSIS — Z23 Encounter for immunization: Secondary | ICD-10-CM | POA: Diagnosis not present

## 2012-03-19 DIAGNOSIS — J209 Acute bronchitis, unspecified: Secondary | ICD-10-CM | POA: Diagnosis not present

## 2012-03-19 DIAGNOSIS — F411 Generalized anxiety disorder: Secondary | ICD-10-CM | POA: Diagnosis not present

## 2012-03-20 DIAGNOSIS — I82409 Acute embolism and thrombosis of unspecified deep veins of unspecified lower extremity: Secondary | ICD-10-CM | POA: Diagnosis not present

## 2012-03-20 DIAGNOSIS — Z79899 Other long term (current) drug therapy: Secondary | ICD-10-CM | POA: Diagnosis not present

## 2012-03-20 DIAGNOSIS — C569 Malignant neoplasm of unspecified ovary: Secondary | ICD-10-CM | POA: Diagnosis not present

## 2012-03-20 DIAGNOSIS — Z01818 Encounter for other preprocedural examination: Secondary | ICD-10-CM | POA: Diagnosis not present

## 2012-03-20 DIAGNOSIS — Z01810 Encounter for preprocedural cardiovascular examination: Secondary | ICD-10-CM | POA: Diagnosis not present

## 2012-03-20 DIAGNOSIS — C7960 Secondary malignant neoplasm of unspecified ovary: Secondary | ICD-10-CM | POA: Diagnosis not present

## 2012-03-20 DIAGNOSIS — C787 Secondary malignant neoplasm of liver and intrahepatic bile duct: Secondary | ICD-10-CM | POA: Diagnosis not present

## 2012-03-20 DIAGNOSIS — C786 Secondary malignant neoplasm of retroperitoneum and peritoneum: Secondary | ICD-10-CM | POA: Diagnosis not present

## 2012-03-20 DIAGNOSIS — R4589 Other symptoms and signs involving emotional state: Secondary | ICD-10-CM | POA: Diagnosis not present

## 2012-03-31 DIAGNOSIS — Z23 Encounter for immunization: Secondary | ICD-10-CM | POA: Diagnosis not present

## 2012-03-31 DIAGNOSIS — J209 Acute bronchitis, unspecified: Secondary | ICD-10-CM | POA: Diagnosis not present

## 2012-03-31 DIAGNOSIS — F411 Generalized anxiety disorder: Secondary | ICD-10-CM | POA: Diagnosis not present

## 2012-04-04 DIAGNOSIS — K429 Umbilical hernia without obstruction or gangrene: Secondary | ICD-10-CM | POA: Diagnosis not present

## 2012-04-04 DIAGNOSIS — Z86718 Personal history of other venous thrombosis and embolism: Secondary | ICD-10-CM | POA: Diagnosis not present

## 2012-04-04 DIAGNOSIS — F411 Generalized anxiety disorder: Secondary | ICD-10-CM | POA: Diagnosis not present

## 2012-04-04 DIAGNOSIS — K660 Peritoneal adhesions (postprocedural) (postinfection): Secondary | ICD-10-CM | POA: Diagnosis not present

## 2012-04-04 DIAGNOSIS — C569 Malignant neoplasm of unspecified ovary: Secondary | ICD-10-CM | POA: Diagnosis not present

## 2012-04-04 DIAGNOSIS — K6389 Other specified diseases of intestine: Secondary | ICD-10-CM | POA: Diagnosis not present

## 2012-04-05 DIAGNOSIS — F411 Generalized anxiety disorder: Secondary | ICD-10-CM | POA: Insufficient documentation

## 2012-04-09 DIAGNOSIS — C569 Malignant neoplasm of unspecified ovary: Secondary | ICD-10-CM | POA: Diagnosis not present

## 2012-04-16 DIAGNOSIS — D649 Anemia, unspecified: Secondary | ICD-10-CM | POA: Diagnosis not present

## 2012-04-16 DIAGNOSIS — Z23 Encounter for immunization: Secondary | ICD-10-CM | POA: Diagnosis not present

## 2012-04-16 DIAGNOSIS — F411 Generalized anxiety disorder: Secondary | ICD-10-CM | POA: Diagnosis not present

## 2012-04-24 DIAGNOSIS — C569 Malignant neoplasm of unspecified ovary: Secondary | ICD-10-CM | POA: Diagnosis not present

## 2012-04-24 DIAGNOSIS — Z5111 Encounter for antineoplastic chemotherapy: Secondary | ICD-10-CM | POA: Diagnosis not present

## 2012-04-28 DIAGNOSIS — C569 Malignant neoplasm of unspecified ovary: Secondary | ICD-10-CM | POA: Diagnosis not present

## 2012-04-28 DIAGNOSIS — Z5111 Encounter for antineoplastic chemotherapy: Secondary | ICD-10-CM | POA: Diagnosis not present

## 2012-05-07 DIAGNOSIS — N72 Inflammatory disease of cervix uteri: Secondary | ICD-10-CM | POA: Diagnosis not present

## 2012-05-07 DIAGNOSIS — Z23 Encounter for immunization: Secondary | ICD-10-CM | POA: Diagnosis not present

## 2012-05-07 DIAGNOSIS — R300 Dysuria: Secondary | ICD-10-CM | POA: Diagnosis not present

## 2012-05-08 DIAGNOSIS — Z5111 Encounter for antineoplastic chemotherapy: Secondary | ICD-10-CM | POA: Diagnosis not present

## 2012-05-08 DIAGNOSIS — C569 Malignant neoplasm of unspecified ovary: Secondary | ICD-10-CM | POA: Diagnosis not present

## 2012-05-14 DIAGNOSIS — C569 Malignant neoplasm of unspecified ovary: Secondary | ICD-10-CM | POA: Diagnosis not present

## 2012-05-14 DIAGNOSIS — Z5111 Encounter for antineoplastic chemotherapy: Secondary | ICD-10-CM | POA: Diagnosis not present

## 2012-05-15 DIAGNOSIS — C569 Malignant neoplasm of unspecified ovary: Secondary | ICD-10-CM | POA: Diagnosis not present

## 2012-05-15 DIAGNOSIS — Z5111 Encounter for antineoplastic chemotherapy: Secondary | ICD-10-CM | POA: Diagnosis not present

## 2012-05-16 DIAGNOSIS — C569 Malignant neoplasm of unspecified ovary: Secondary | ICD-10-CM | POA: Diagnosis not present

## 2012-05-16 DIAGNOSIS — Z5111 Encounter for antineoplastic chemotherapy: Secondary | ICD-10-CM | POA: Diagnosis not present

## 2012-05-17 ENCOUNTER — Other Ambulatory Visit: Payer: Self-pay

## 2012-05-17 DIAGNOSIS — C569 Malignant neoplasm of unspecified ovary: Secondary | ICD-10-CM | POA: Diagnosis not present

## 2012-05-17 LAB — CBC WITH DIFFERENTIAL/PLATELET
Basophil #: 0 10*3/uL (ref 0.0–0.1)
Basophil %: 0.3 %
HGB: 9.9 g/dL — ABNORMAL LOW (ref 12.0–16.0)
Lymphocyte #: 1.8 10*3/uL (ref 1.0–3.6)
Lymphocyte %: 45 %
MCH: 34 pg (ref 26.0–34.0)
MCHC: 34 g/dL (ref 32.0–36.0)
Monocyte #: 0.3 x10 3/mm (ref 0.2–0.9)
Monocyte %: 7.5 %
RBC: 2.91 10*6/uL — ABNORMAL LOW (ref 3.80–5.20)
WBC: 4 10*3/uL (ref 3.6–11.0)

## 2012-05-20 DIAGNOSIS — Z5111 Encounter for antineoplastic chemotherapy: Secondary | ICD-10-CM | POA: Diagnosis not present

## 2012-05-20 DIAGNOSIS — C569 Malignant neoplasm of unspecified ovary: Secondary | ICD-10-CM | POA: Diagnosis not present

## 2012-05-22 DIAGNOSIS — Z79899 Other long term (current) drug therapy: Secondary | ICD-10-CM | POA: Diagnosis not present

## 2012-05-22 DIAGNOSIS — C569 Malignant neoplasm of unspecified ovary: Secondary | ICD-10-CM | POA: Diagnosis not present

## 2012-05-22 DIAGNOSIS — Z5111 Encounter for antineoplastic chemotherapy: Secondary | ICD-10-CM | POA: Diagnosis not present

## 2012-05-29 DIAGNOSIS — Z5111 Encounter for antineoplastic chemotherapy: Secondary | ICD-10-CM | POA: Diagnosis not present

## 2012-05-29 DIAGNOSIS — C569 Malignant neoplasm of unspecified ovary: Secondary | ICD-10-CM | POA: Diagnosis not present

## 2012-06-05 ENCOUNTER — Ambulatory Visit: Payer: Self-pay | Admitting: Family Medicine

## 2012-06-05 DIAGNOSIS — J3489 Other specified disorders of nose and nasal sinuses: Secondary | ICD-10-CM | POA: Diagnosis not present

## 2012-06-05 DIAGNOSIS — R220 Localized swelling, mass and lump, head: Secondary | ICD-10-CM | POA: Diagnosis not present

## 2012-06-05 DIAGNOSIS — Z5111 Encounter for antineoplastic chemotherapy: Secondary | ICD-10-CM | POA: Diagnosis not present

## 2012-06-05 DIAGNOSIS — C569 Malignant neoplasm of unspecified ovary: Secondary | ICD-10-CM | POA: Diagnosis not present

## 2012-06-05 DIAGNOSIS — J329 Chronic sinusitis, unspecified: Secondary | ICD-10-CM | POA: Diagnosis not present

## 2012-06-05 DIAGNOSIS — Z23 Encounter for immunization: Secondary | ICD-10-CM | POA: Diagnosis not present

## 2012-06-06 DIAGNOSIS — R221 Localized swelling, mass and lump, neck: Secondary | ICD-10-CM | POA: Diagnosis not present

## 2012-06-06 DIAGNOSIS — C801 Malignant (primary) neoplasm, unspecified: Secondary | ICD-10-CM | POA: Diagnosis not present

## 2012-06-06 DIAGNOSIS — D696 Thrombocytopenia, unspecified: Secondary | ICD-10-CM | POA: Diagnosis not present

## 2012-06-06 DIAGNOSIS — Z23 Encounter for immunization: Secondary | ICD-10-CM | POA: Diagnosis not present

## 2012-06-06 DIAGNOSIS — R220 Localized swelling, mass and lump, head: Secondary | ICD-10-CM | POA: Diagnosis not present

## 2012-06-07 ENCOUNTER — Other Ambulatory Visit: Payer: Self-pay | Admitting: Family Medicine

## 2012-06-07 DIAGNOSIS — D72819 Decreased white blood cell count, unspecified: Secondary | ICD-10-CM | POA: Diagnosis not present

## 2012-06-07 DIAGNOSIS — D649 Anemia, unspecified: Secondary | ICD-10-CM | POA: Diagnosis not present

## 2012-06-07 DIAGNOSIS — D696 Thrombocytopenia, unspecified: Secondary | ICD-10-CM | POA: Diagnosis not present

## 2012-06-07 LAB — CBC WITH DIFFERENTIAL/PLATELET
Basophil #: 0 10*3/uL (ref 0.0–0.1)
Basophil %: 0.1 %
Eosinophil #: 0 10*3/uL (ref 0.0–0.7)
Eosinophil %: 0.8 %
HCT: 22.7 % — ABNORMAL LOW (ref 35.0–47.0)
HGB: 8 g/dL — ABNORMAL LOW (ref 12.0–16.0)
Lymphocyte #: 1.5 10*3/uL (ref 1.0–3.6)
Lymphocyte %: 61.5 %
MCH: 34.8 pg — ABNORMAL HIGH (ref 26.0–34.0)
MCV: 99 fL (ref 80–100)
Monocyte #: 0.3 x10 3/mm (ref 0.2–0.9)
Neutrophil %: 24.5 %
Platelet: 16 10*3/uL — CL (ref 150–440)
RBC: 2.29 10*6/uL — ABNORMAL LOW (ref 3.80–5.20)
RDW: 14.3 % (ref 11.5–14.5)
WBC: 2.5 10*3/uL — ABNORMAL LOW (ref 3.6–11.0)

## 2012-06-08 ENCOUNTER — Other Ambulatory Visit: Payer: Self-pay | Admitting: Family Medicine

## 2012-06-08 DIAGNOSIS — D696 Thrombocytopenia, unspecified: Secondary | ICD-10-CM | POA: Diagnosis not present

## 2012-06-08 DIAGNOSIS — D649 Anemia, unspecified: Secondary | ICD-10-CM | POA: Diagnosis not present

## 2012-06-08 LAB — CBC WITH DIFFERENTIAL/PLATELET
Basophil #: 0 10*3/uL (ref 0.0–0.1)
Basophil %: 0.1 %
Eosinophil #: 0 10*3/uL (ref 0.0–0.7)
Eosinophil %: 0.8 %
HCT: 24.1 % — ABNORMAL LOW (ref 35.0–47.0)
HGB: 8.3 g/dL — ABNORMAL LOW (ref 12.0–16.0)
Lymphocyte #: 1.8 10*3/uL (ref 1.0–3.6)
Lymphocyte %: 55.5 %
MCH: 34.5 pg — ABNORMAL HIGH (ref 26.0–34.0)
MCHC: 34.4 g/dL (ref 32.0–36.0)
MCV: 100 fL (ref 80–100)
Monocyte #: 0.4 x10 3/mm (ref 0.2–0.9)
Monocyte %: 11.4 %
Neutrophil #: 1.1 10*3/uL — ABNORMAL LOW (ref 1.4–6.5)
Neutrophil %: 32.2 %
Platelet: 17 10*3/uL — CL (ref 150–440)
RBC: 2.4 10*6/uL — ABNORMAL LOW (ref 3.80–5.20)
RDW: 14.2 % (ref 11.5–14.5)
WBC: 3.3 10*3/uL — ABNORMAL LOW (ref 3.6–11.0)

## 2012-06-09 DIAGNOSIS — D696 Thrombocytopenia, unspecified: Secondary | ICD-10-CM | POA: Diagnosis not present

## 2012-06-10 DIAGNOSIS — D696 Thrombocytopenia, unspecified: Secondary | ICD-10-CM | POA: Diagnosis not present

## 2012-06-11 DIAGNOSIS — C569 Malignant neoplasm of unspecified ovary: Secondary | ICD-10-CM | POA: Diagnosis not present

## 2012-06-11 DIAGNOSIS — Z0183 Encounter for blood typing: Secondary | ICD-10-CM | POA: Diagnosis not present

## 2012-06-12 ENCOUNTER — Other Ambulatory Visit: Payer: Self-pay | Admitting: Cardiology

## 2012-06-12 DIAGNOSIS — D696 Thrombocytopenia, unspecified: Secondary | ICD-10-CM | POA: Diagnosis not present

## 2012-06-12 DIAGNOSIS — C569 Malignant neoplasm of unspecified ovary: Secondary | ICD-10-CM | POA: Diagnosis not present

## 2012-06-12 LAB — CBC WITH DIFFERENTIAL/PLATELET
Basophil #: 0 10*3/uL (ref 0.0–0.1)
Eosinophil #: 0 10*3/uL (ref 0.0–0.7)
Eosinophil %: 0.3 %
HCT: 31.9 % — ABNORMAL LOW (ref 35.0–47.0)
Lymphocyte #: 1.7 10*3/uL (ref 1.0–3.6)
Lymphocyte %: 39.9 %
MCHC: 34.4 g/dL (ref 32.0–36.0)
MCV: 96 fL (ref 80–100)
Monocyte %: 6.8 %
Neutrophil #: 2.2 10*3/uL (ref 1.4–6.5)
Platelet: 25 10*3/uL — CL (ref 150–440)
RDW: 16.7 % — ABNORMAL HIGH (ref 11.5–14.5)
WBC: 4.3 10*3/uL (ref 3.6–11.0)

## 2012-06-13 DIAGNOSIS — C569 Malignant neoplasm of unspecified ovary: Secondary | ICD-10-CM | POA: Diagnosis not present

## 2012-06-14 DIAGNOSIS — D696 Thrombocytopenia, unspecified: Secondary | ICD-10-CM | POA: Diagnosis not present

## 2012-06-14 DIAGNOSIS — C569 Malignant neoplasm of unspecified ovary: Secondary | ICD-10-CM | POA: Diagnosis not present

## 2012-06-14 LAB — CBC WITH DIFFERENTIAL/PLATELET
Basophil %: 0.3 %
Eosinophil #: 0 10*3/uL (ref 0.0–0.7)
Eosinophil %: 0.3 %
HGB: 11.2 g/dL — ABNORMAL LOW (ref 12.0–16.0)
Lymphocyte %: 36.8 %
MCH: 33.2 pg (ref 26.0–34.0)
Monocyte #: 0.2 x10 3/mm (ref 0.2–0.9)
Neutrophil #: 2.4 10*3/uL (ref 1.4–6.5)
Neutrophil %: 56.9 %
Platelet: 37 10*3/uL — ABNORMAL LOW (ref 150–440)
RBC: 3.38 10*6/uL — ABNORMAL LOW (ref 3.80–5.20)

## 2012-06-15 DIAGNOSIS — D696 Thrombocytopenia, unspecified: Secondary | ICD-10-CM | POA: Diagnosis not present

## 2012-06-15 DIAGNOSIS — C569 Malignant neoplasm of unspecified ovary: Secondary | ICD-10-CM | POA: Diagnosis not present

## 2012-06-15 LAB — CBC WITH DIFFERENTIAL/PLATELET
Basophil #: 0 10*3/uL (ref 0.0–0.1)
Basophil %: 0.2 %
Eosinophil #: 0 10*3/uL (ref 0.0–0.7)
HGB: 10.7 g/dL — ABNORMAL LOW (ref 12.0–16.0)
Lymphocyte %: 34.8 %
MCHC: 34.2 g/dL (ref 32.0–36.0)
Monocyte #: 0.3 x10 3/mm (ref 0.2–0.9)
Monocyte %: 7.4 %
Platelet: 53 10*3/uL — ABNORMAL LOW (ref 150–440)
RBC: 3.22 10*6/uL — ABNORMAL LOW (ref 3.80–5.20)
RDW: 15.4 % — ABNORMAL HIGH (ref 11.5–14.5)

## 2012-06-18 DIAGNOSIS — K7689 Other specified diseases of liver: Secondary | ICD-10-CM | POA: Diagnosis not present

## 2012-06-18 DIAGNOSIS — D1800 Hemangioma unspecified site: Secondary | ICD-10-CM | POA: Diagnosis not present

## 2012-06-18 DIAGNOSIS — R918 Other nonspecific abnormal finding of lung field: Secondary | ICD-10-CM | POA: Diagnosis not present

## 2012-06-18 DIAGNOSIS — C569 Malignant neoplasm of unspecified ovary: Secondary | ICD-10-CM | POA: Diagnosis not present

## 2012-06-19 DIAGNOSIS — C569 Malignant neoplasm of unspecified ovary: Secondary | ICD-10-CM | POA: Diagnosis not present

## 2012-06-27 DIAGNOSIS — D1803 Hemangioma of intra-abdominal structures: Secondary | ICD-10-CM | POA: Diagnosis not present

## 2012-06-27 DIAGNOSIS — K7689 Other specified diseases of liver: Secondary | ICD-10-CM | POA: Diagnosis not present

## 2012-07-10 ENCOUNTER — Other Ambulatory Visit: Payer: Self-pay | Admitting: Cardiology

## 2012-08-08 DIAGNOSIS — C569 Malignant neoplasm of unspecified ovary: Secondary | ICD-10-CM | POA: Diagnosis not present

## 2012-09-07 DIAGNOSIS — D1800 Hemangioma unspecified site: Secondary | ICD-10-CM | POA: Diagnosis not present

## 2012-09-07 DIAGNOSIS — Z79899 Other long term (current) drug therapy: Secondary | ICD-10-CM | POA: Diagnosis not present

## 2012-09-07 DIAGNOSIS — C569 Malignant neoplasm of unspecified ovary: Secondary | ICD-10-CM | POA: Diagnosis not present

## 2012-09-07 DIAGNOSIS — R599 Enlarged lymph nodes, unspecified: Secondary | ICD-10-CM | POA: Diagnosis not present

## 2012-09-07 DIAGNOSIS — K7689 Other specified diseases of liver: Secondary | ICD-10-CM | POA: Diagnosis not present

## 2012-09-07 DIAGNOSIS — D1803 Hemangioma of intra-abdominal structures: Secondary | ICD-10-CM | POA: Diagnosis not present

## 2012-09-08 DIAGNOSIS — C569 Malignant neoplasm of unspecified ovary: Secondary | ICD-10-CM | POA: Diagnosis not present

## 2012-09-08 DIAGNOSIS — Z1501 Genetic susceptibility to malignant neoplasm of breast: Secondary | ICD-10-CM | POA: Insufficient documentation

## 2012-09-24 DIAGNOSIS — Z23 Encounter for immunization: Secondary | ICD-10-CM | POA: Diagnosis not present

## 2012-10-29 DIAGNOSIS — D1800 Hemangioma unspecified site: Secondary | ICD-10-CM | POA: Diagnosis not present

## 2012-10-29 DIAGNOSIS — K7689 Other specified diseases of liver: Secondary | ICD-10-CM | POA: Diagnosis not present

## 2012-10-29 DIAGNOSIS — Z8543 Personal history of malignant neoplasm of ovary: Secondary | ICD-10-CM | POA: Diagnosis not present

## 2012-10-29 DIAGNOSIS — C787 Secondary malignant neoplasm of liver and intrahepatic bile duct: Secondary | ICD-10-CM | POA: Diagnosis not present

## 2012-10-29 DIAGNOSIS — C569 Malignant neoplasm of unspecified ovary: Secondary | ICD-10-CM | POA: Diagnosis not present

## 2012-10-29 DIAGNOSIS — Z9189 Other specified personal risk factors, not elsewhere classified: Secondary | ICD-10-CM | POA: Diagnosis not present

## 2012-10-29 DIAGNOSIS — Z1231 Encounter for screening mammogram for malignant neoplasm of breast: Secondary | ICD-10-CM | POA: Diagnosis not present

## 2012-11-03 DIAGNOSIS — Z9079 Acquired absence of other genital organ(s): Secondary | ICD-10-CM | POA: Diagnosis not present

## 2012-11-03 DIAGNOSIS — Z9221 Personal history of antineoplastic chemotherapy: Secondary | ICD-10-CM | POA: Diagnosis not present

## 2012-11-03 DIAGNOSIS — Z90710 Acquired absence of both cervix and uterus: Secondary | ICD-10-CM | POA: Diagnosis not present

## 2012-11-03 DIAGNOSIS — C787 Secondary malignant neoplasm of liver and intrahepatic bile duct: Secondary | ICD-10-CM | POA: Diagnosis not present

## 2012-11-03 DIAGNOSIS — C569 Malignant neoplasm of unspecified ovary: Secondary | ICD-10-CM | POA: Diagnosis not present

## 2012-11-20 DIAGNOSIS — M461 Sacroiliitis, not elsewhere classified: Secondary | ICD-10-CM | POA: Diagnosis not present

## 2012-11-20 DIAGNOSIS — M47817 Spondylosis without myelopathy or radiculopathy, lumbosacral region: Secondary | ICD-10-CM | POA: Diagnosis not present

## 2013-01-28 DIAGNOSIS — C569 Malignant neoplasm of unspecified ovary: Secondary | ICD-10-CM | POA: Diagnosis not present

## 2013-01-28 DIAGNOSIS — C787 Secondary malignant neoplasm of liver and intrahepatic bile duct: Secondary | ICD-10-CM | POA: Diagnosis not present

## 2013-01-28 DIAGNOSIS — C7951 Secondary malignant neoplasm of bone: Secondary | ICD-10-CM | POA: Diagnosis not present

## 2013-01-28 DIAGNOSIS — D1800 Hemangioma unspecified site: Secondary | ICD-10-CM | POA: Diagnosis not present

## 2013-02-02 DIAGNOSIS — C787 Secondary malignant neoplasm of liver and intrahepatic bile duct: Secondary | ICD-10-CM | POA: Diagnosis not present

## 2013-02-02 DIAGNOSIS — Z90710 Acquired absence of both cervix and uterus: Secondary | ICD-10-CM | POA: Diagnosis not present

## 2013-02-02 DIAGNOSIS — Z9221 Personal history of antineoplastic chemotherapy: Secondary | ICD-10-CM | POA: Diagnosis not present

## 2013-02-02 DIAGNOSIS — C569 Malignant neoplasm of unspecified ovary: Secondary | ICD-10-CM | POA: Diagnosis not present

## 2013-04-22 DIAGNOSIS — C569 Malignant neoplasm of unspecified ovary: Secondary | ICD-10-CM | POA: Diagnosis not present

## 2013-04-22 DIAGNOSIS — N6089 Other benign mammary dysplasias of unspecified breast: Secondary | ICD-10-CM | POA: Diagnosis not present

## 2013-05-05 ENCOUNTER — Ambulatory Visit: Payer: Self-pay | Admitting: Family Medicine

## 2013-05-05 DIAGNOSIS — E559 Vitamin D deficiency, unspecified: Secondary | ICD-10-CM | POA: Diagnosis not present

## 2013-05-05 DIAGNOSIS — D649 Anemia, unspecified: Secondary | ICD-10-CM | POA: Diagnosis not present

## 2013-05-05 DIAGNOSIS — E7800 Pure hypercholesterolemia, unspecified: Secondary | ICD-10-CM | POA: Diagnosis not present

## 2013-05-05 DIAGNOSIS — R7309 Other abnormal glucose: Secondary | ICD-10-CM | POA: Diagnosis not present

## 2013-05-05 DIAGNOSIS — R059 Cough, unspecified: Secondary | ICD-10-CM | POA: Diagnosis not present

## 2013-05-05 DIAGNOSIS — R05 Cough: Secondary | ICD-10-CM | POA: Diagnosis not present

## 2013-05-06 DIAGNOSIS — Z8543 Personal history of malignant neoplasm of ovary: Secondary | ICD-10-CM | POA: Diagnosis not present

## 2013-05-06 DIAGNOSIS — D1802 Hemangioma of intracranial structures: Secondary | ICD-10-CM | POA: Diagnosis not present

## 2013-05-06 DIAGNOSIS — M47817 Spondylosis without myelopathy or radiculopathy, lumbosacral region: Secondary | ICD-10-CM | POA: Diagnosis not present

## 2013-05-06 DIAGNOSIS — D1800 Hemangioma unspecified site: Secondary | ICD-10-CM | POA: Diagnosis not present

## 2013-05-06 DIAGNOSIS — C569 Malignant neoplasm of unspecified ovary: Secondary | ICD-10-CM | POA: Diagnosis not present

## 2013-05-06 DIAGNOSIS — G9389 Other specified disorders of brain: Secondary | ICD-10-CM | POA: Diagnosis not present

## 2013-05-11 DIAGNOSIS — R7309 Other abnormal glucose: Secondary | ICD-10-CM | POA: Diagnosis not present

## 2013-05-11 DIAGNOSIS — E7800 Pure hypercholesterolemia, unspecified: Secondary | ICD-10-CM | POA: Diagnosis not present

## 2013-05-11 DIAGNOSIS — D649 Anemia, unspecified: Secondary | ICD-10-CM | POA: Diagnosis not present

## 2013-05-11 DIAGNOSIS — E559 Vitamin D deficiency, unspecified: Secondary | ICD-10-CM | POA: Diagnosis not present

## 2013-06-05 DIAGNOSIS — L259 Unspecified contact dermatitis, unspecified cause: Secondary | ICD-10-CM | POA: Diagnosis not present

## 2013-06-05 DIAGNOSIS — L821 Other seborrheic keratosis: Secondary | ICD-10-CM | POA: Diagnosis not present

## 2013-06-05 DIAGNOSIS — D1801 Hemangioma of skin and subcutaneous tissue: Secondary | ICD-10-CM | POA: Diagnosis not present

## 2013-06-05 DIAGNOSIS — Z85828 Personal history of other malignant neoplasm of skin: Secondary | ICD-10-CM | POA: Diagnosis not present

## 2013-06-05 DIAGNOSIS — D239 Other benign neoplasm of skin, unspecified: Secondary | ICD-10-CM | POA: Diagnosis not present

## 2013-06-05 DIAGNOSIS — L439 Lichen planus, unspecified: Secondary | ICD-10-CM | POA: Diagnosis not present

## 2013-06-05 DIAGNOSIS — L280 Lichen simplex chronicus: Secondary | ICD-10-CM | POA: Diagnosis not present

## 2013-06-05 DIAGNOSIS — L723 Sebaceous cyst: Secondary | ICD-10-CM | POA: Diagnosis not present

## 2013-07-13 DIAGNOSIS — Z85828 Personal history of other malignant neoplasm of skin: Secondary | ICD-10-CM | POA: Diagnosis not present

## 2013-07-13 DIAGNOSIS — D1801 Hemangioma of skin and subcutaneous tissue: Secondary | ICD-10-CM | POA: Diagnosis not present

## 2013-07-13 DIAGNOSIS — L259 Unspecified contact dermatitis, unspecified cause: Secondary | ICD-10-CM | POA: Diagnosis not present

## 2013-08-05 DIAGNOSIS — D1803 Hemangioma of intra-abdominal structures: Secondary | ICD-10-CM | POA: Diagnosis not present

## 2013-08-05 DIAGNOSIS — K7689 Other specified diseases of liver: Secondary | ICD-10-CM | POA: Diagnosis not present

## 2013-08-05 DIAGNOSIS — C569 Malignant neoplasm of unspecified ovary: Secondary | ICD-10-CM | POA: Diagnosis not present

## 2013-08-17 DIAGNOSIS — Z801 Family history of malignant neoplasm of trachea, bronchus and lung: Secondary | ICD-10-CM | POA: Diagnosis not present

## 2013-08-17 DIAGNOSIS — M549 Dorsalgia, unspecified: Secondary | ICD-10-CM | POA: Diagnosis not present

## 2013-08-17 DIAGNOSIS — M25569 Pain in unspecified knee: Secondary | ICD-10-CM | POA: Diagnosis not present

## 2013-08-17 DIAGNOSIS — Z8541 Personal history of malignant neoplasm of cervix uteri: Secondary | ICD-10-CM | POA: Diagnosis not present

## 2013-08-17 DIAGNOSIS — Z90710 Acquired absence of both cervix and uterus: Secondary | ICD-10-CM | POA: Diagnosis not present

## 2013-08-17 DIAGNOSIS — Z8041 Family history of malignant neoplasm of ovary: Secondary | ICD-10-CM | POA: Diagnosis not present

## 2013-08-17 DIAGNOSIS — C569 Malignant neoplasm of unspecified ovary: Secondary | ICD-10-CM | POA: Diagnosis not present

## 2013-08-17 DIAGNOSIS — Z79899 Other long term (current) drug therapy: Secondary | ICD-10-CM | POA: Diagnosis not present

## 2013-08-17 DIAGNOSIS — Z9079 Acquired absence of other genital organ(s): Secondary | ICD-10-CM | POA: Diagnosis not present

## 2013-08-17 DIAGNOSIS — C787 Secondary malignant neoplasm of liver and intrahepatic bile duct: Secondary | ICD-10-CM | POA: Diagnosis not present

## 2013-08-17 DIAGNOSIS — R059 Cough, unspecified: Secondary | ICD-10-CM | POA: Diagnosis not present

## 2013-08-27 IMAGING — CT CT ABD-PELV W/ CM
2 of 5 series · 16 of 46 positions shown, 18 images · IV contrast (APPLIED)
Comparison: 09/27/2011

CLINICAL DATA: Abdominal pain

CT ABDOMEN AND PELVIS WITH CONTRAST
TECHNIQUE: Multidetector CT imaging of the abdomen and pelvis was
performed following the standard protocol during bolus
administration of intravenous contrast.
Contrast: 100mL OMNIPAQUE IOHEXOL 300 MG/ML IV SOLN

[Series 2: abd/pel with · axial · 0.74mm/px · z∈[-451,-41]mm · 13 of 92 slices shown, 15 images]
[im 5/92  soft-tissue]
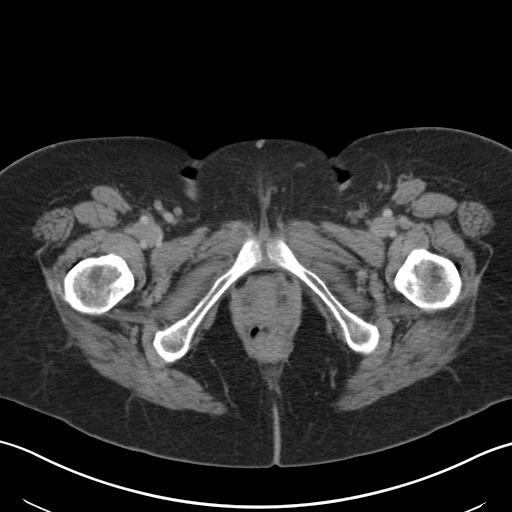
[im 5/92  bone]
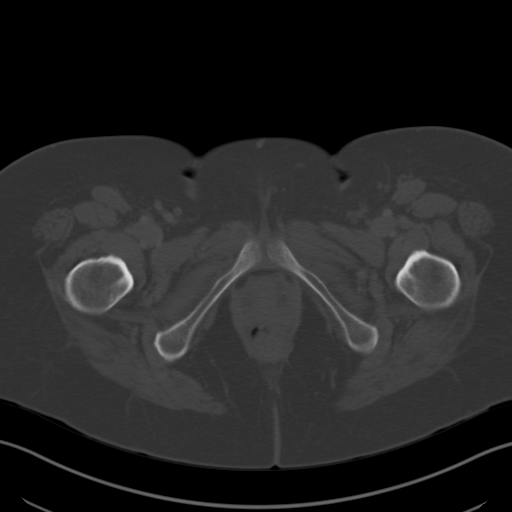
[im 14/92  soft-tissue]
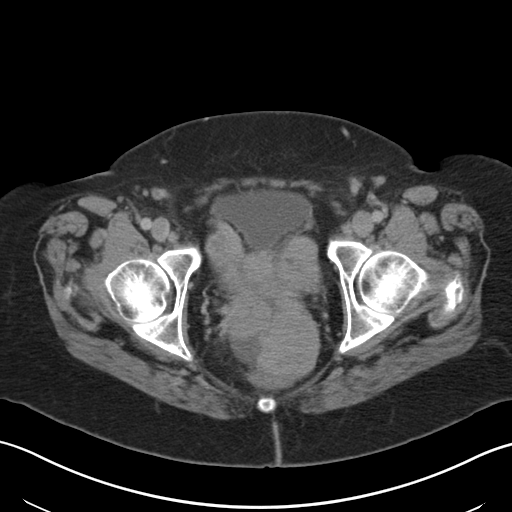
[im 19/92  soft-tissue]
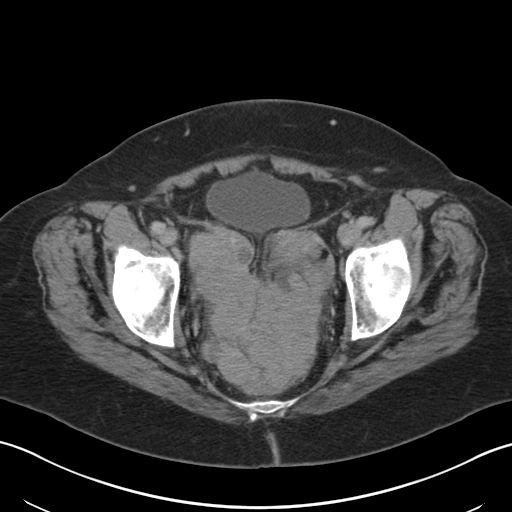
[im 28/92  soft-tissue]
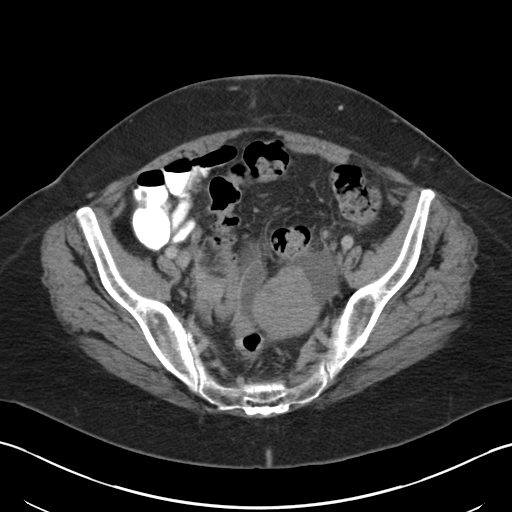
[im 32/92  soft-tissue]
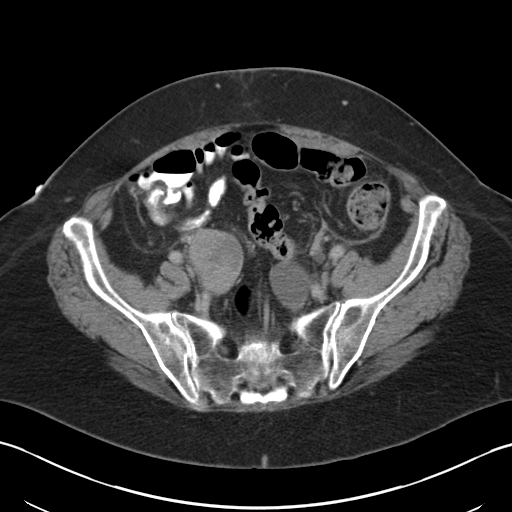
[im 41/92  soft-tissue]
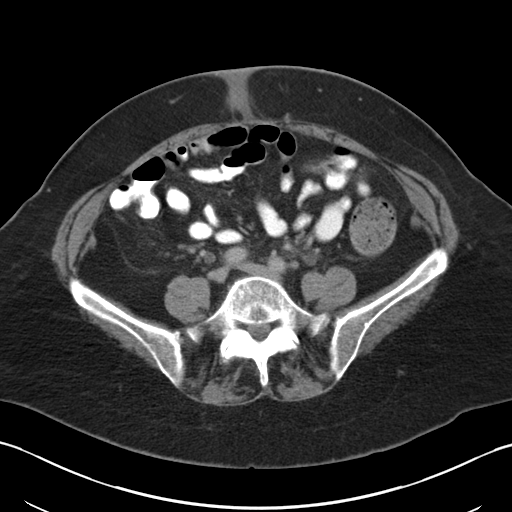
[im 46/92  soft-tissue]
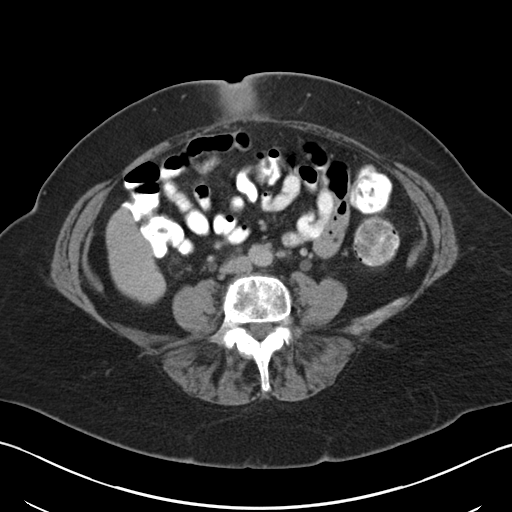
[im 51/92  soft-tissue]
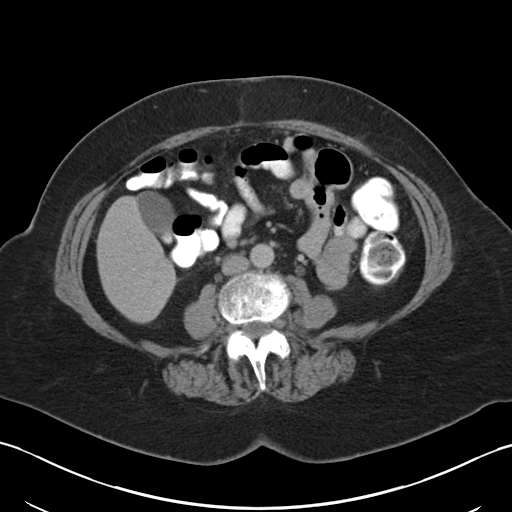
[im 60/92  soft-tissue]
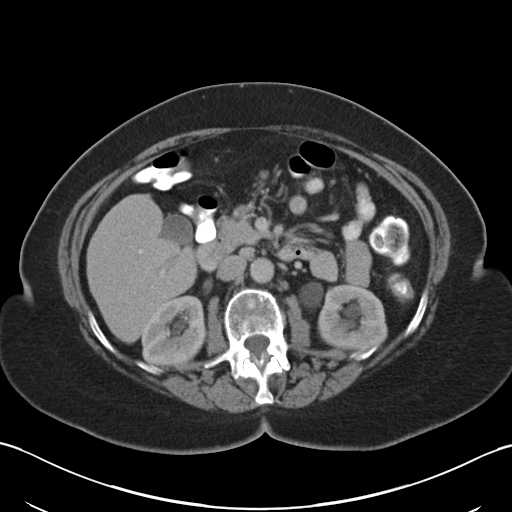
[im 60/92  bone]
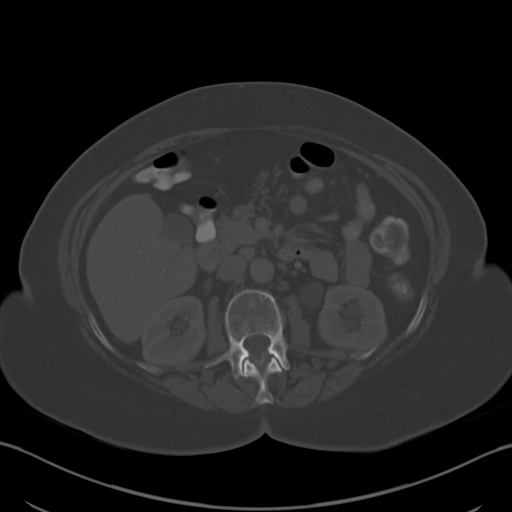
[im 64/92  soft-tissue]
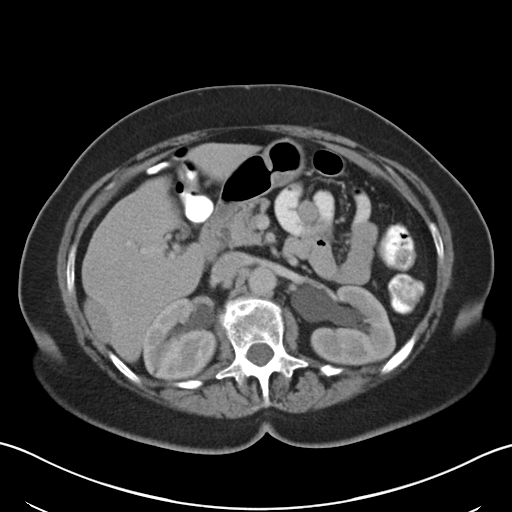
[im 73/92  soft-tissue]
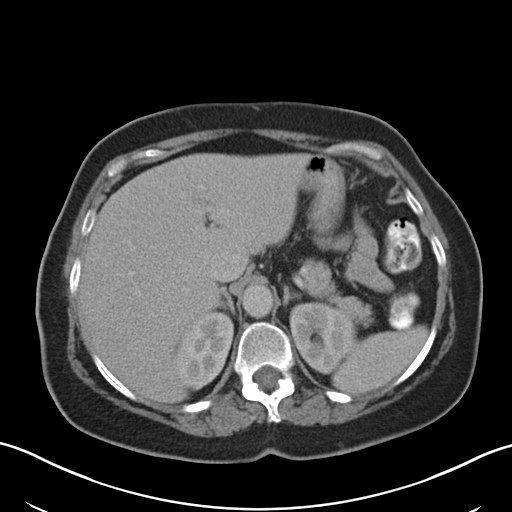
[im 78/92  soft-tissue]
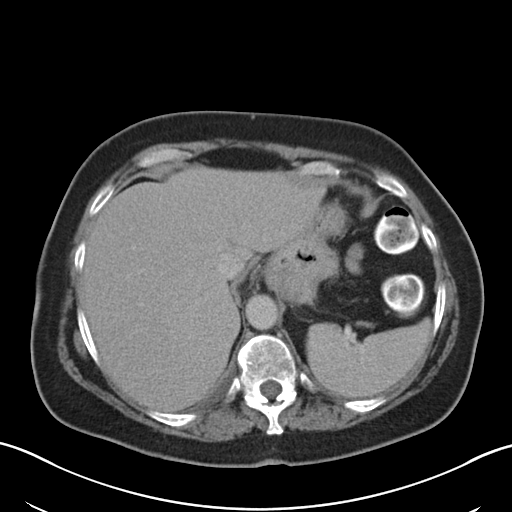
[im 87/92  soft-tissue]
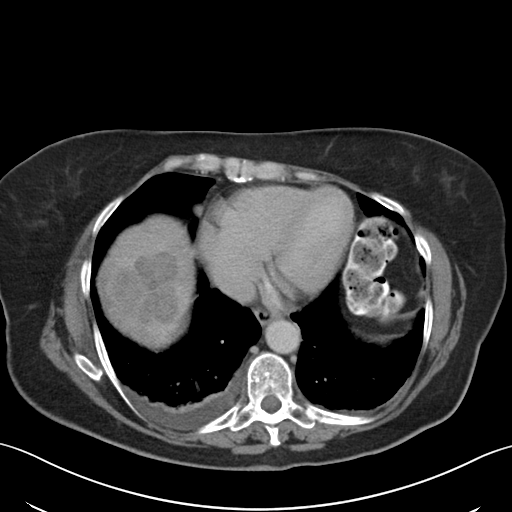

[Series 5: coronal · coronal · 0.74mm/px · 3 of 53 slices shown]
[im 18/53  soft-tissue]
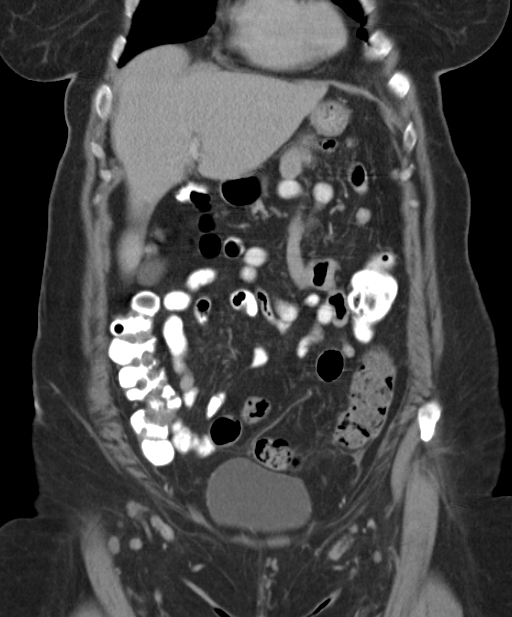
[im 24/53  soft-tissue]
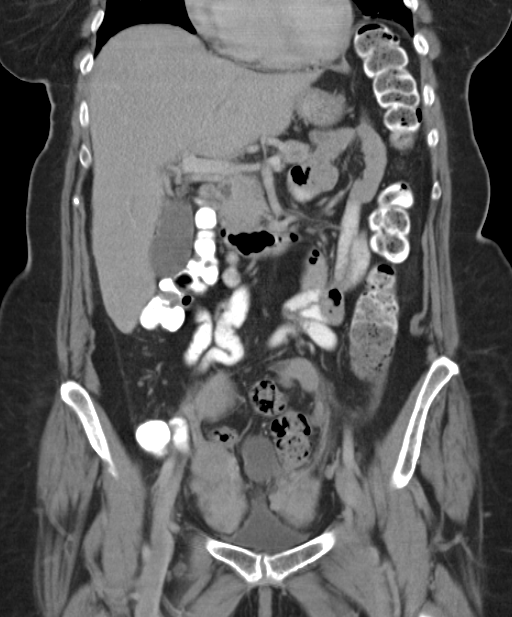
[im 29/53  soft-tissue]
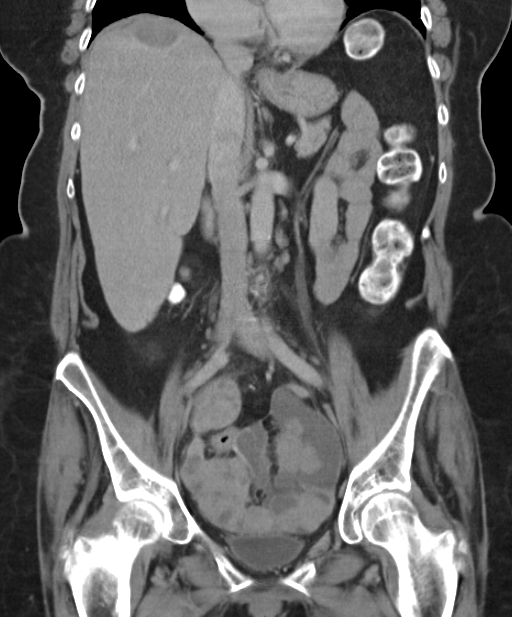

[16 of 46 positions shown; findings below may reference images not displayed]

FINDINGS: Small right and trace left pleural effusions.  Associated
consolidations; atelectasis versus pneumonia. Coronary artery
calcification.

Since the prior study the mass along the hepatic dome measures
x 5.5 cm, without significant interval change.
Perihepatic/subcapsular lesions are similar to slightly enlarged.
As index along the periphery of the right hepatic lobe, measures
4.2 x 1.9 cm, 3.3 x 1.7 cm previously.

Unremarkable spleen, pancreas, gallbladder.  Stable mild
extrahepatic biliary ductal prominence without obstructing lesion
identified.  Unremarkable adrenal glands.  Symmetric renal
enhancement with fullness of the ureters to the level of the
pelvis.  No bowel obstruction.  No CT evidence for colitis.

Thin-walled bladder is displaced by the lobular pelvic mass.
Largest diameter, measures 12.8 cm as compared to 12.4 cm
previously.  There is a small amount of associated fluid
attenuation.  No loculated fluid collection to suggest abscess.
The periumbilical implant measures 2.8 x 3.1 cm, 2.3 x 2.3 cm
previously.  There are numerous other smaller implants and/or
nodules scattered throughout the peritoneum.  A dominant right
iliac chain nodal conglomerate measures 4.8 x 3.6 cm, 4.3 x 3.5 cm
previously.  There is mass effect on the iliac vasculature however
no vascular thrombus or obstruction identified.

Osteopenia and multilevel degenerative changes.  No acute or
aggressive osseous abnormality identified.
IMPRESSION: Interval disease progression with increase in size of several of
the lesions, including within the pelvis, along the liver surface,
and periumbilical/anterior abdominal wall.

There is mild bilateral hydroureteronephrosis, left greater than
right secondary to the pelvic process.

Small right and trace left pleural effusions.  Associated
consolidations; atelectasis versus pneumonia.

## 2013-09-02 DIAGNOSIS — H2510 Age-related nuclear cataract, unspecified eye: Secondary | ICD-10-CM | POA: Diagnosis not present

## 2013-09-02 DIAGNOSIS — H52229 Regular astigmatism, unspecified eye: Secondary | ICD-10-CM | POA: Diagnosis not present

## 2013-09-02 DIAGNOSIS — H3560 Retinal hemorrhage, unspecified eye: Secondary | ICD-10-CM | POA: Diagnosis not present

## 2013-09-02 DIAGNOSIS — H5200 Hypermetropia, unspecified eye: Secondary | ICD-10-CM | POA: Diagnosis not present

## 2013-10-12 DIAGNOSIS — J069 Acute upper respiratory infection, unspecified: Secondary | ICD-10-CM | POA: Diagnosis not present

## 2013-10-12 DIAGNOSIS — D649 Anemia, unspecified: Secondary | ICD-10-CM | POA: Diagnosis not present

## 2013-10-12 DIAGNOSIS — E559 Vitamin D deficiency, unspecified: Secondary | ICD-10-CM | POA: Diagnosis not present

## 2013-10-12 DIAGNOSIS — R7309 Other abnormal glucose: Secondary | ICD-10-CM | POA: Diagnosis not present

## 2013-10-12 DIAGNOSIS — Z1330 Encounter for screening examination for mental health and behavioral disorders, unspecified: Secondary | ICD-10-CM | POA: Diagnosis not present

## 2013-10-12 DIAGNOSIS — Z1331 Encounter for screening for depression: Secondary | ICD-10-CM | POA: Diagnosis not present

## 2013-11-09 DIAGNOSIS — Z85828 Personal history of other malignant neoplasm of skin: Secondary | ICD-10-CM | POA: Diagnosis not present

## 2013-11-09 DIAGNOSIS — L259 Unspecified contact dermatitis, unspecified cause: Secondary | ICD-10-CM | POA: Diagnosis not present

## 2013-11-09 DIAGNOSIS — L280 Lichen simplex chronicus: Secondary | ICD-10-CM | POA: Diagnosis not present

## 2013-12-01 DIAGNOSIS — R7309 Other abnormal glucose: Secondary | ICD-10-CM | POA: Diagnosis not present

## 2013-12-01 DIAGNOSIS — J069 Acute upper respiratory infection, unspecified: Secondary | ICD-10-CM | POA: Diagnosis not present

## 2013-12-01 DIAGNOSIS — D649 Anemia, unspecified: Secondary | ICD-10-CM | POA: Diagnosis not present

## 2013-12-01 DIAGNOSIS — Z23 Encounter for immunization: Secondary | ICD-10-CM | POA: Diagnosis not present

## 2013-12-01 DIAGNOSIS — E559 Vitamin D deficiency, unspecified: Secondary | ICD-10-CM | POA: Diagnosis not present

## 2013-12-01 DIAGNOSIS — Z1330 Encounter for screening examination for mental health and behavioral disorders, unspecified: Secondary | ICD-10-CM | POA: Diagnosis not present

## 2013-12-01 DIAGNOSIS — Z1331 Encounter for screening for depression: Secondary | ICD-10-CM | POA: Diagnosis not present

## 2013-12-14 DIAGNOSIS — Z1502 Genetic susceptibility to malignant neoplasm of ovary: Secondary | ICD-10-CM | POA: Diagnosis not present

## 2013-12-14 DIAGNOSIS — Z1501 Genetic susceptibility to malignant neoplasm of breast: Secondary | ICD-10-CM | POA: Diagnosis not present

## 2013-12-14 DIAGNOSIS — M79609 Pain in unspecified limb: Secondary | ICD-10-CM | POA: Diagnosis not present

## 2013-12-14 DIAGNOSIS — Z8541 Personal history of malignant neoplasm of cervix uteri: Secondary | ICD-10-CM | POA: Diagnosis not present

## 2013-12-14 DIAGNOSIS — C787 Secondary malignant neoplasm of liver and intrahepatic bile duct: Secondary | ICD-10-CM | POA: Diagnosis not present

## 2013-12-14 DIAGNOSIS — Z801 Family history of malignant neoplasm of trachea, bronchus and lung: Secondary | ICD-10-CM | POA: Diagnosis not present

## 2013-12-14 DIAGNOSIS — G9389 Other specified disorders of brain: Secondary | ICD-10-CM | POA: Diagnosis not present

## 2013-12-14 DIAGNOSIS — Z9221 Personal history of antineoplastic chemotherapy: Secondary | ICD-10-CM | POA: Diagnosis not present

## 2013-12-14 DIAGNOSIS — Z8041 Family history of malignant neoplasm of ovary: Secondary | ICD-10-CM | POA: Diagnosis not present

## 2013-12-14 DIAGNOSIS — M549 Dorsalgia, unspecified: Secondary | ICD-10-CM | POA: Diagnosis not present

## 2013-12-14 DIAGNOSIS — R110 Nausea: Secondary | ICD-10-CM | POA: Diagnosis not present

## 2013-12-14 DIAGNOSIS — Z90710 Acquired absence of both cervix and uterus: Secondary | ICD-10-CM | POA: Diagnosis not present

## 2013-12-14 DIAGNOSIS — Z79899 Other long term (current) drug therapy: Secondary | ICD-10-CM | POA: Diagnosis not present

## 2013-12-14 DIAGNOSIS — Z86718 Personal history of other venous thrombosis and embolism: Secondary | ICD-10-CM | POA: Diagnosis not present

## 2013-12-14 DIAGNOSIS — L659 Nonscarring hair loss, unspecified: Secondary | ICD-10-CM | POA: Diagnosis not present

## 2013-12-14 DIAGNOSIS — C569 Malignant neoplasm of unspecified ovary: Secondary | ICD-10-CM | POA: Diagnosis not present

## 2013-12-14 DIAGNOSIS — Z9079 Acquired absence of other genital organ(s): Secondary | ICD-10-CM | POA: Diagnosis not present

## 2014-01-20 ENCOUNTER — Ambulatory Visit: Payer: Self-pay | Admitting: Orthopedic Surgery

## 2014-01-20 DIAGNOSIS — IMO0002 Reserved for concepts with insufficient information to code with codable children: Secondary | ICD-10-CM | POA: Diagnosis not present

## 2014-01-20 DIAGNOSIS — M1710 Unilateral primary osteoarthritis, unspecified knee: Secondary | ICD-10-CM | POA: Diagnosis not present

## 2014-01-20 DIAGNOSIS — M25569 Pain in unspecified knee: Secondary | ICD-10-CM | POA: Diagnosis not present

## 2014-03-17 DIAGNOSIS — R7309 Other abnormal glucose: Secondary | ICD-10-CM | POA: Diagnosis not present

## 2014-03-17 DIAGNOSIS — F3289 Other specified depressive episodes: Secondary | ICD-10-CM | POA: Diagnosis not present

## 2014-03-17 DIAGNOSIS — F329 Major depressive disorder, single episode, unspecified: Secondary | ICD-10-CM | POA: Diagnosis not present

## 2014-03-17 DIAGNOSIS — F411 Generalized anxiety disorder: Secondary | ICD-10-CM | POA: Diagnosis not present

## 2014-03-17 DIAGNOSIS — E7800 Pure hypercholesterolemia, unspecified: Secondary | ICD-10-CM | POA: Diagnosis not present

## 2014-03-17 DIAGNOSIS — Z23 Encounter for immunization: Secondary | ICD-10-CM | POA: Diagnosis not present

## 2014-03-17 DIAGNOSIS — G4700 Insomnia, unspecified: Secondary | ICD-10-CM | POA: Diagnosis not present

## 2014-03-17 DIAGNOSIS — E559 Vitamin D deficiency, unspecified: Secondary | ICD-10-CM | POA: Diagnosis not present

## 2014-03-18 DIAGNOSIS — R7309 Other abnormal glucose: Secondary | ICD-10-CM | POA: Diagnosis not present

## 2014-03-18 DIAGNOSIS — E7800 Pure hypercholesterolemia, unspecified: Secondary | ICD-10-CM | POA: Diagnosis not present

## 2014-03-18 DIAGNOSIS — E559 Vitamin D deficiency, unspecified: Secondary | ICD-10-CM | POA: Diagnosis not present

## 2014-03-29 DIAGNOSIS — C569 Malignant neoplasm of unspecified ovary: Secondary | ICD-10-CM | POA: Diagnosis not present

## 2014-03-29 DIAGNOSIS — N281 Cyst of kidney, acquired: Secondary | ICD-10-CM | POA: Diagnosis not present

## 2014-03-29 DIAGNOSIS — K7689 Other specified diseases of liver: Secondary | ICD-10-CM | POA: Diagnosis not present

## 2014-03-29 DIAGNOSIS — D1803 Hemangioma of intra-abdominal structures: Secondary | ICD-10-CM | POA: Diagnosis not present

## 2014-04-12 DIAGNOSIS — Z801 Family history of malignant neoplasm of trachea, bronchus and lung: Secondary | ICD-10-CM | POA: Diagnosis not present

## 2014-04-12 DIAGNOSIS — Z9221 Personal history of antineoplastic chemotherapy: Secondary | ICD-10-CM | POA: Diagnosis not present

## 2014-04-12 DIAGNOSIS — Z09 Encounter for follow-up examination after completed treatment for conditions other than malignant neoplasm: Secondary | ICD-10-CM | POA: Diagnosis not present

## 2014-04-12 DIAGNOSIS — Z8041 Family history of malignant neoplasm of ovary: Secondary | ICD-10-CM | POA: Diagnosis not present

## 2014-04-12 DIAGNOSIS — Z79899 Other long term (current) drug therapy: Secondary | ICD-10-CM | POA: Diagnosis not present

## 2014-04-12 DIAGNOSIS — Z9079 Acquired absence of other genital organ(s): Secondary | ICD-10-CM | POA: Diagnosis not present

## 2014-04-12 DIAGNOSIS — L659 Nonscarring hair loss, unspecified: Secondary | ICD-10-CM | POA: Diagnosis not present

## 2014-04-12 DIAGNOSIS — Z90710 Acquired absence of both cervix and uterus: Secondary | ICD-10-CM | POA: Diagnosis not present

## 2014-04-12 DIAGNOSIS — Z803 Family history of malignant neoplasm of breast: Secondary | ICD-10-CM | POA: Diagnosis not present

## 2014-04-12 DIAGNOSIS — Z8543 Personal history of malignant neoplasm of ovary: Secondary | ICD-10-CM | POA: Diagnosis not present

## 2014-04-12 DIAGNOSIS — Z8541 Personal history of malignant neoplasm of cervix uteri: Secondary | ICD-10-CM | POA: Diagnosis not present

## 2014-04-12 DIAGNOSIS — Z86718 Personal history of other venous thrombosis and embolism: Secondary | ICD-10-CM | POA: Diagnosis not present

## 2014-04-12 DIAGNOSIS — C569 Malignant neoplasm of unspecified ovary: Secondary | ICD-10-CM | POA: Diagnosis not present

## 2014-04-12 DIAGNOSIS — D1809 Hemangioma of other sites: Secondary | ICD-10-CM | POA: Diagnosis not present

## 2014-04-12 DIAGNOSIS — Z1501 Genetic susceptibility to malignant neoplasm of breast: Secondary | ICD-10-CM | POA: Diagnosis not present

## 2014-05-05 DIAGNOSIS — M1710 Unilateral primary osteoarthritis, unspecified knee: Secondary | ICD-10-CM | POA: Diagnosis not present

## 2014-07-12 DIAGNOSIS — M79609 Pain in unspecified limb: Secondary | ICD-10-CM | POA: Diagnosis not present

## 2014-07-12 DIAGNOSIS — Z8543 Personal history of malignant neoplasm of ovary: Secondary | ICD-10-CM | POA: Diagnosis not present

## 2014-07-12 DIAGNOSIS — C569 Malignant neoplasm of unspecified ovary: Secondary | ICD-10-CM | POA: Diagnosis not present

## 2014-07-12 DIAGNOSIS — M7989 Other specified soft tissue disorders: Secondary | ICD-10-CM | POA: Diagnosis not present

## 2014-07-12 DIAGNOSIS — Z8505 Personal history of malignant neoplasm of liver: Secondary | ICD-10-CM | POA: Diagnosis not present

## 2014-07-19 DIAGNOSIS — Z23 Encounter for immunization: Secondary | ICD-10-CM | POA: Diagnosis not present

## 2014-07-21 DIAGNOSIS — M1710 Unilateral primary osteoarthritis, unspecified knee: Secondary | ICD-10-CM | POA: Diagnosis not present

## 2014-08-18 DIAGNOSIS — M1710 Unilateral primary osteoarthritis, unspecified knee: Secondary | ICD-10-CM | POA: Diagnosis not present

## 2014-08-25 DIAGNOSIS — M1710 Unilateral primary osteoarthritis, unspecified knee: Secondary | ICD-10-CM | POA: Diagnosis not present

## 2014-09-01 DIAGNOSIS — M1710 Unilateral primary osteoarthritis, unspecified knee: Secondary | ICD-10-CM | POA: Diagnosis not present

## 2014-10-06 DIAGNOSIS — M1712 Unilateral primary osteoarthritis, left knee: Secondary | ICD-10-CM | POA: Diagnosis not present

## 2014-10-07 ENCOUNTER — Ambulatory Visit: Payer: Self-pay | Admitting: Orthopedic Surgery

## 2014-10-07 DIAGNOSIS — M25569 Pain in unspecified knee: Secondary | ICD-10-CM | POA: Diagnosis not present

## 2014-10-07 DIAGNOSIS — M179 Osteoarthritis of knee, unspecified: Secondary | ICD-10-CM | POA: Diagnosis not present

## 2014-10-07 DIAGNOSIS — M170 Bilateral primary osteoarthritis of knee: Secondary | ICD-10-CM | POA: Diagnosis not present

## 2014-11-22 DIAGNOSIS — K769 Liver disease, unspecified: Secondary | ICD-10-CM | POA: Diagnosis not present

## 2014-11-22 DIAGNOSIS — C787 Secondary malignant neoplasm of liver and intrahepatic bile duct: Secondary | ICD-10-CM | POA: Diagnosis not present

## 2014-11-22 DIAGNOSIS — C569 Malignant neoplasm of unspecified ovary: Secondary | ICD-10-CM | POA: Diagnosis not present

## 2014-11-30 ENCOUNTER — Encounter (HOSPITAL_COMMUNITY): Payer: Self-pay | Admitting: Physician Assistant

## 2014-11-30 DIAGNOSIS — M1712 Unilateral primary osteoarthritis, left knee: Secondary | ICD-10-CM | POA: Diagnosis present

## 2014-11-30 DIAGNOSIS — C569 Malignant neoplasm of unspecified ovary: Secondary | ICD-10-CM | POA: Diagnosis present

## 2014-11-30 NOTE — H&P (Addendum)
TOTAL KNEE ADMISSION H&P  Patient is being admitted for left total knee arthroplasty.  Subjective:  Chief Complaint:left knee pain.  HPI: Nancy Downs, 78 y.o. female, has a history of pain and functional disability in the left knee due to arthritis and has failed non-surgical conservative treatments for greater than 12 weeks to includeNSAID's and/or analgesics, corticosteriod injections, viscosupplementation injections, flexibility and strengthening excercises, supervised PT with diminished ADL's post treatment, use of assistive devices and activity modification.  Onset of symptoms was gradual, starting 10 years ago with gradually worsening course since that time. The patient noted no past surgery on the left knee(s).  Patient currently rates pain in the left knee(s) at 10 out of 10 with activity. Patient has night pain, worsening of pain with activity and weight bearing, pain that interferes with activities of daily living, crepitus and joint swelling.  Patient has evidence of subchondral sclerosis, periarticular osteophytes and joint space narrowing by imaging studies. There is no active infection.  Patient Active Problem List   Diagnosis Date Noted  . 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 Primary localized osteoarthritis of left knee   . ERRONEOUS ENCOUNTER--DISREGARD 10/17/2011  . Abdominal wall cellulitis 10/14/2011  . Anemia 10/14/2011  . Fever 10/14/2011  . HLD (hyperlipidemia) 10/14/2011  . UTI (lower urinary tract infection) 10/14/2011  . Carcinoma of unknown primary 09/27/2011  . Umbilical mass [id number]   Past Medical History  Diagnosis Date  . Hyperlipidemia   . Arthritis   . Anemia   . Cough   . Constipation   . Easy bruising   . Fatigue   . Bronchitis 08/2011  . Cancer     liver  . Primary localized osteoarthritis of left knee     Past Surgical History  Procedure Laterality Date  . Tonsillectomy and adenoidectomy  1945  . Appendectomy  1966  . Spider vein  injection  1968    varicose veins stripped   . Abdominal hysterectomy  1977    uterine cancer   . Ankle surgery  1996    broken ankle plates and pins put in place   . Knee surgery  2004/2012  . Portacath placement  10/23/2011    Procedure: INSERTION PORT-A-CATH;  Surgeon: Odis Hollingshead, MD;  Location: [place];  Service: General;  Laterality: Right;  insertion right internal jugular port-a-cath    No prescriptions prior to admission  No current facility-administered medications for this encounter. Current outpatient prescriptions: ALPRAZolam (XANAX) 0.5 MG tablet, Take 0.5 mg by mouth 3 (three) times daily as needed. For anxiety  , Disp: , Rfl: ;  calcium-vitamin D (OSCAL WITH D) 500-200 MG-UNIT per tablet, Take 1 tablet by mouth 2 (two) times daily.  , Disp: , Rfl: ;  citalopram (CELEXA) 20 MG tablet, Take 30 mg by mouth every morning. , Disp: , Rfl:  HYDROcodone-acetaminophen (VICODIN) 5-500 MG per tablet, Take 0.5-1 tablets by mouth every 6 (six) hours as needed. For pain, Disp: , Rfl: ;  zolpidem (AMBIEN) 10 MG tablet, Take 10 mg by mouth at bedtime as needed for sleep., Disp: , Rfl: ;  diphenhydramine-acetaminophen (TYLENOL PM) 25-500 MG TABS, Take 1 tablet by mouth at bedtime. , Disp: , Rfl:  ergocalciferol (VITAMIN D2) 50000 UNITS capsule, Take 50,000 Units by mouth every 30 (thirty) days. , Disp: , Rfl: ;  ferrous sulfate 325 (65 FE) MG tablet, Take 325 mg by mouth daily with breakfast. , Disp: , Rfl: ;  fluconazole (DIFLUCAN) 200 MG tablet, Take 200  mg by mouth daily. Take for 7 days , Disp: , Rfl: ;  halobetasol (ULTRAVATE) 0.05 % cream, Apply 1 application topically 2 (two) times daily as needed. For itching, Disp: , Rfl:  lovastatin (MEVACOR) 20 MG tablet, Take 20 mg by mouth at bedtime. , Disp: , Rfl: ;  Probiotic Product (ACIDOPHILUS PEARLS PO), Take 1 capsule by mouth daily as needed. For Constipation , Disp: , Rfl: ;  sennosides-docusate sodium (SENOKOT-S) 8.6-50 MG tablet, Take 2  tablets by mouth daily as needed. For Constipation  , Disp: , Rfl: ;  vitamin B-12 (CYANOCOBALAMIN) 250 MCG tablet, Take 1,000 mcg by mouth daily. , Disp: , Rfl:  Allergies  Allergen Reactions  . Sodium Tetradecyl Sulfate Shortness Of Breath    SOB, coughing, rash neck/chest     History  Substance Use Topics  . Smoking status: Never Smoker   . Smokeless tobacco: Never Used  . Alcohol Use: Yes     Comment: occasional    Family History  Problem Relation Age of Onset  . Cancer Mother 61    ovarian   . Cancer Father 2    lung cancer     Review of Systems  Constitutional: Negative.   HENT: Negative.   Eyes: Negative.   Cardiovascular: Negative.   Gastrointestinal: Negative.   Genitourinary: Negative.   Musculoskeletal: Positive for back pain and joint pain.  Skin: Negative.   Neurological: Negative.   Endo/Heme/Allergies: Negative.   Psychiatric/Behavioral: Negative.     Objective:  Physical Exam  Constitutional: She is oriented to person, place, and time. She appears well-developed and well-nourished.  HENT:  Head: Atraumatic.  Mouth/Throat: Oropharynx is clear and moist.  Eyes: Conjunctivae and EOM are normal. Pupils are equal, round, and reactive to light.  Neck: Neck supple.  Cardiovascular: Normal rate, regular rhythm and intact distal pulses.   Respiratory: Effort normal and breath sounds normal.  Genitourinary:  Not pertinent to current symptomatology therefore not examined.  Musculoskeletal:  Examination of her left knee reveals pain medially and laterally 1+ crepitation 1+ synovitis range of motion -5 to 120 degrees knee is stable with normal patella tracking. Exam of the right knee reveals diffuse pain 1+ synovitis full range of motion knee is stable with normal patella tracking. Vascular exam: pulses 2+ and symmetric.   Neurological: She is alert and oriented to person, place, and time.  Skin: Skin is dry.  Psychiatric: She has a normal mood and affect. Her  behavior is normal.    Vital signs in last 24 hours: Temp:  [97.7 F (36.5 C)] 97.7 F (36.5 C) (12/22 1100) Pulse Rate:  [98] 98 (12/22 1100) BP: (145)/(82) 145/82 mmHg (12/22 1100) SpO2:  [98 %] 98 % (12/22 1100) Weight:  [92.534 kg (204 lb)] 92.534 kg (204 lb) (12/22 1100)  Labs:   Estimated body mass index is 31.94 kg/(m^2) as calculated from the following:   Height as of this encounter: 5\' 7"  (1.702 m).   Weight as of this encounter: 92.534 kg (204 lb).   Imaging Review Plain radiographs demonstrate severe degenerative joint disease of the left knee(s). The overall alignment ismild valgus. The bone quality appears to be good for age and reported activity level.  Assessment/Plan:  End stage arthritis, left knee Principal Problem:   Primary localized osteoarthritis of left knee Active Problems:   Anemia   HLD (hyperlipidemia)   Ovarian carcinosarcoma    The patient history, physical examination, clinical judgment of the provider and imaging studies are  consistent with end stage degenerative joint disease of the left knee(s) and total knee arthroplasty is deemed medically necessary. The treatment options including medical management, injection therapy arthroscopy and arthroplasty were discussed at length. The risks and benefits of total knee arthroplasty were presented and reviewed. The risks due to aseptic loosening, infection, stiffness, patella tracking problems, thromboembolic complications and other imponderables were discussed. The patient acknowledged the explanation, agreed to proceed with the plan and consent was signed. Patient is being admitted for inpatient treatment for surgery, pain control, PT, OT, prophylactic antibiotics, VTE prophylaxis, progressive ambulation and ADL's and discharge planning. The patient is planning to be discharged home with home health services  Patient's staph screen was positive and her MRSA screen was negative.  She has started her  mupirocin and will shower with CHG  Nancy Downs A. Kaleen Mask Physician Assistant [facility] [phone]  12/12/2014, 2:04 PM

## 2014-12-08 NOTE — Pre-Procedure Instructions (Addendum)
FLESHIA FRATANGELO  12/08/2014   Your procedure is scheduled on:  12/20/14  Report to [facility] short stay admitting at 109 AM.  Call this number if you have problems the morning of surgery: [phone]   Remember:   Do not eat food or drink liquids after midnight.   Take these medicines the morning of surgery with A SIP OF WATER: xanax,pain med if needed, celexa  STOP all herbel meds, nsaids (aleve,naproxen,advil,ibuprofen) 5 days prior to surgery (12/15/14) including vitamins, biotin, primrose oil, fish oil flaxseed,aspirin    Do not wear jewelry, make-up or nail polish.  Do not wear lotions, powders, or perfumes. You may NOT wear deodorant the day of surgery.  Do not shave underarms & legs 48 hours prior to surgery.    Do not bring valuables to the hospital.  [facility] is not responsible for any belongings or valuables.               Contacts, dentures or bridgework may not be worn into surgery.  Leave suitcase in the car. After surgery it may be brought to your room.  For patients admitted to the hospital, discharge time is determined by your treatment team.                 Name and phone number of your driver:    Special Instructions:"Preparing for Surgery" instruction sheet.   Please read over the following fact sheets that you were given: Pain Booklet, Coughing and Deep Breathing, Blood Transfusion Information, Total Joint Packet, MRSA Information and Surgical Site Infection Prevention

## 2014-12-09 ENCOUNTER — Encounter (HOSPITAL_COMMUNITY)
Admission: RE | Admit: 2014-12-09 | Discharge: 2014-12-09 | Disposition: A | Discharge status: Home / Self Care | Payer: Medicare Other | Source: Ambulatory Visit | Attending: Orthopedic Surgery | Admitting: Orthopedic Surgery

## 2014-12-09 ENCOUNTER — Encounter (HOSPITAL_COMMUNITY): Payer: Self-pay

## 2014-12-09 DIAGNOSIS — Z01818 Encounter for other preprocedural examination: Secondary | ICD-10-CM | POA: Diagnosis not present

## 2014-12-09 DIAGNOSIS — Z9221 Personal history of antineoplastic chemotherapy: Secondary | ICD-10-CM | POA: Diagnosis not present

## 2014-12-09 DIAGNOSIS — C787 Secondary malignant neoplasm of liver and intrahepatic bile duct: Secondary | ICD-10-CM | POA: Diagnosis not present

## 2014-12-09 DIAGNOSIS — E785 Hyperlipidemia, unspecified: Secondary | ICD-10-CM | POA: Diagnosis not present

## 2014-12-09 DIAGNOSIS — Z90710 Acquired absence of both cervix and uterus: Secondary | ICD-10-CM | POA: Diagnosis not present

## 2014-12-09 DIAGNOSIS — D649 Anemia, unspecified: Secondary | ICD-10-CM | POA: Diagnosis not present

## 2014-12-09 DIAGNOSIS — M1712 Unilateral primary osteoarthritis, left knee: Secondary | ICD-10-CM | POA: Insufficient documentation

## 2014-12-09 DIAGNOSIS — Z90722 Acquired absence of ovaries, bilateral: Secondary | ICD-10-CM | POA: Diagnosis not present

## 2014-12-09 DIAGNOSIS — C569 Malignant neoplasm of unspecified ovary: Secondary | ICD-10-CM | POA: Diagnosis not present

## 2014-12-09 DIAGNOSIS — I4519 Other right bundle-branch block: Secondary | ICD-10-CM | POA: Insufficient documentation

## 2014-12-09 DIAGNOSIS — F419 Anxiety disorder, unspecified: Secondary | ICD-10-CM | POA: Insufficient documentation

## 2014-12-09 DIAGNOSIS — Z01812 Encounter for preprocedural laboratory examination: Secondary | ICD-10-CM | POA: Diagnosis not present

## 2014-12-09 DIAGNOSIS — Z9079 Acquired absence of other genital organ(s): Secondary | ICD-10-CM | POA: Diagnosis not present

## 2014-12-09 DIAGNOSIS — D1809 Hemangioma of other sites: Secondary | ICD-10-CM | POA: Insufficient documentation

## 2014-12-09 HISTORY — DX: Other specified postprocedural states: R11.2

## 2014-12-09 HISTORY — DX: Nausea with vomiting, unspecified: Z98.890

## 2014-12-09 HISTORY — DX: Anxiety disorder, unspecified: F41.9

## 2014-12-09 LAB — URINALYSIS, ROUTINE W REFLEX MICROSCOPIC
Bilirubin Urine: NEGATIVE
GLUCOSE, UA: NEGATIVE mg/dL
HGB URINE DIPSTICK: NEGATIVE
Ketones, ur: NEGATIVE mg/dL
LEUKOCYTES UA: NEGATIVE
Nitrite: NEGATIVE
PH: 5 (ref 5.0–8.0)
Protein, ur: NEGATIVE mg/dL
Specific Gravity, Urine: 1.018 (ref 1.005–1.030)
Urobilinogen, UA: 0.2 mg/dL (ref 0.0–1.0)

## 2014-12-09 LAB — CBC WITH DIFFERENTIAL/PLATELET
BASOS ABS: 0 10*3/uL (ref 0.0–0.1)
Basophils Relative: 0 % (ref 0–1)
EOS PCT: 2 % (ref 0–5)
Eosinophils Absolute: 0.1 10*3/uL (ref 0.0–0.7)
HCT: 36 % (ref 36.0–46.0)
HEMOGLOBIN: 11.7 g/dL — AB (ref 12.0–15.0)
LYMPHS ABS: 1.4 10*3/uL (ref 0.7–4.0)
LYMPHS PCT: 24 % (ref 12–46)
MCH: 29.9 pg (ref 26.0–34.0)
MCHC: 32.5 g/dL (ref 30.0–36.0)
MCV: 92.1 fL (ref 78.0–100.0)
MONOS PCT: 4 % (ref 3–12)
Monocytes Absolute: 0.2 10*3/uL (ref 0.1–1.0)
Neutro Abs: 4.1 10*3/uL (ref 1.7–7.7)
Neutrophils Relative %: 70 % (ref 43–77)
PLATELETS: 264 10*3/uL (ref 150–400)
RBC: 3.91 MIL/uL (ref 3.87–5.11)
RDW: 12.9 % (ref 11.5–15.5)
WBC: 5.8 10*3/uL (ref 4.0–10.5)

## 2014-12-09 LAB — COMPREHENSIVE METABOLIC PANEL
ALBUMIN: 4.1 g/dL (ref 3.5–5.2)
ALK PHOS: 64 U/L (ref 39–117)
ALT: 16 U/L (ref 0–35)
ANION GAP: 8 (ref 5–15)
AST: 18 U/L (ref 0–37)
BILIRUBIN TOTAL: 0.7 mg/dL (ref 0.3–1.2)
BUN: 13 mg/dL (ref 6–23)
CHLORIDE: 104 meq/L (ref 96–112)
CO2: 26 mmol/L (ref 19–32)
Calcium: 9.5 mg/dL (ref 8.4–10.5)
Creatinine, Ser: 0.76 mg/dL (ref 0.50–1.10)
GFR calc Af Amer: >90 mL/min (ref >90)
GFR calc non Af Amer: 80 mL/min — ABNORMAL LOW (ref >90)
Glucose, Bld: 113 mg/dL — ABNORMAL HIGH (ref 70–99)
POTASSIUM: 3.9 mmol/L (ref 3.5–5.1)
SODIUM: 138 mmol/L (ref 135–145)
Total Protein: 6.8 g/dL (ref 6.0–8.3)

## 2014-12-09 LAB — APTT: aPTT: 27 seconds (ref 24–37)

## 2014-12-09 LAB — PROTIME-INR
INR: 0.98 (ref 0.00–1.49)
Prothrombin Time: 13.1 seconds (ref 11.6–15.2)

## 2014-12-09 LAB — SURGICAL PCR SCREEN
MRSA, PCR: NEGATIVE
Staphylococcus aureus: POSITIVE — AB

## 2014-12-09 NOTE — Progress Notes (Addendum)
She denies any cardiac issues and sees no cardio now. She did have some test done "yrs ago, but Dr. Johnsie Cancel" and everything came out negative. (back in 2007) Diagnosed in 2012 with ovarian ca, had chemo starting in Nov. 2012 to April 2013.  Has 2 tumors on her liver. Would prefer to get blood sample drawn the DOS for t & s, d/t the holiday coming up.   DA

## 2014-12-12 LAB — URINE CULTURE: Colony Count: 60000

## 2014-12-13 ENCOUNTER — Encounter (HOSPITAL_COMMUNITY): Payer: Self-pay

## 2014-12-13 DIAGNOSIS — E780 Pure hypercholesterolemia: Secondary | ICD-10-CM | POA: Diagnosis not present

## 2014-12-13 DIAGNOSIS — Z01818 Encounter for other preprocedural examination: Secondary | ICD-10-CM | POA: Diagnosis not present

## 2014-12-13 DIAGNOSIS — R7309 Other abnormal glucose: Secondary | ICD-10-CM | POA: Diagnosis not present

## 2014-12-13 NOTE — Progress Notes (Signed)
Anesthesia Chart Review:  Patient is a 79 year old female scheduled for left TKA on 12/20/14 by Dr. Noemi Chapel.  History includes non-smoker, "liver" cancer (although GYN ONC notes in [place] actually state she has stage IV ovarian cancer based on hepatic metastases--with stable MRI of the abd/pelvis 03/2014), oncology noted from , HLD, anxiety, anemia, hysterectomy, bruises easily, T&A, appendectomy, robotic BSO with omentectomy with optimal interval debulking and UHR 04/04/12, right IJ Port-a-cath for chemotherapy '12, post-operative N/V. PCP is listed as Dr. Margarita Rana. Not followed by cardiology, although reportedly saw Dr. Johnsie Cancel ~ 2007 with a negative work-up.  EKG 12/09/14: NSR, low voltage QRS, incomplete right BBB. Borderline LAD is new since 10/12/11 EKG.  GYN-ONC records in [facility]): - 5/15 CA-125 was normal.   - MRI abd/pelvis 03/29/14: IMPRESSION: 1. No evidence of enhancement surrounding the previous described segment VII lesion which continues to be favored to represent a treated metastasis or sclerosed hemangioma. Lesion is stable since first studied here on CT Jan 2013. No other evidence of recurrent or metastatic disease within the abdomen or pelvis. 2. Unchanged segment VII hemangioma.  - MRI breast 04/12/14: ASSESSMENT: BI-RADS CATEGORY 1: NEGATIVE. MANAGEMENT RECOMMENDATIONS: Routine high risk screening.  Dr. Nancy Marus is aware of plans for TKR.  Preoperative labs noted. She has requested T&S be done on the day of surgery.  If no acute changes then I anticipate that she can proceed as planned.  George Hugh [facility] Short Stay Center/Anesthesiology Phone [phone] 12/13/2014 3:15 PM

## 2014-12-17 DIAGNOSIS — E780 Pure hypercholesterolemia: Secondary | ICD-10-CM | POA: Diagnosis not present

## 2014-12-17 DIAGNOSIS — Z1501 Genetic susceptibility to malignant neoplasm of breast: Secondary | ICD-10-CM | POA: Diagnosis not present

## 2014-12-17 DIAGNOSIS — R928 Other abnormal and inconclusive findings on diagnostic imaging of breast: Secondary | ICD-10-CM | POA: Diagnosis not present

## 2014-12-17 DIAGNOSIS — D649 Anemia, unspecified: Secondary | ICD-10-CM | POA: Diagnosis not present

## 2014-12-17 DIAGNOSIS — C569 Malignant neoplasm of unspecified ovary: Secondary | ICD-10-CM | POA: Diagnosis not present

## 2014-12-17 DIAGNOSIS — R7309 Other abnormal glucose: Secondary | ICD-10-CM | POA: Diagnosis not present

## 2014-12-19 MED ORDER — CEFAZOLIN SODIUM-DEXTROSE 2-3 GM-% IV SOLR
2.0000 g | INTRAVENOUS | Status: AC
  Administered 2014-12-20: 2 g via INTRAVENOUS
  Filled 2014-12-19: qty 50

## 2014-12-20 ENCOUNTER — Inpatient Hospital Stay (HOSPITAL_COMMUNITY)
Admission: RE | Admit: 2014-12-20 | Discharge: 2014-12-21 | DRG: 470 | Disposition: A | Discharge status: Home Health | Payer: Medicare Other | Source: Ambulatory Visit | Attending: Orthopedic Surgery | Admitting: Orthopedic Surgery

## 2014-12-20 ENCOUNTER — Inpatient Hospital Stay (HOSPITAL_COMMUNITY): Payer: Medicare Other | Admitting: Vascular Surgery

## 2014-12-20 ENCOUNTER — Encounter (HOSPITAL_COMMUNITY)
Admission: RE | Disposition: A | Discharge status: Home Health | Payer: Self-pay | Source: Ambulatory Visit | Attending: Orthopedic Surgery

## 2014-12-20 DIAGNOSIS — M1712 Unilateral primary osteoarthritis, left knee: Secondary | ICD-10-CM | POA: Diagnosis present

## 2014-12-20 DIAGNOSIS — D649 Anemia, unspecified: Secondary | ICD-10-CM | POA: Diagnosis present

## 2014-12-20 DIAGNOSIS — E785 Hyperlipidemia, unspecified: Secondary | ICD-10-CM | POA: Diagnosis present

## 2014-12-20 DIAGNOSIS — C569 Malignant neoplasm of unspecified ovary: Secondary | ICD-10-CM | POA: Diagnosis present

## 2014-12-20 DIAGNOSIS — F419 Anxiety disorder, unspecified: Secondary | ICD-10-CM | POA: Diagnosis present

## 2014-12-20 DIAGNOSIS — Z7982 Long term (current) use of aspirin: Secondary | ICD-10-CM

## 2014-12-20 DIAGNOSIS — J40 Bronchitis, not specified as acute or chronic: Secondary | ICD-10-CM | POA: Diagnosis present

## 2014-12-20 DIAGNOSIS — E669 Obesity, unspecified: Secondary | ICD-10-CM | POA: Diagnosis present

## 2014-12-20 DIAGNOSIS — M179 Osteoarthritis of knee, unspecified: Secondary | ICD-10-CM | POA: Diagnosis not present

## 2014-12-20 DIAGNOSIS — M1710 Unilateral primary osteoarthritis, unspecified knee: Secondary | ICD-10-CM | POA: Diagnosis present

## 2014-12-20 DIAGNOSIS — Z6830 Body mass index (BMI) 30.0-30.9, adult: Secondary | ICD-10-CM | POA: Diagnosis not present

## 2014-12-20 DIAGNOSIS — N390 Urinary tract infection, site not specified: Secondary | ICD-10-CM | POA: Diagnosis present

## 2014-12-20 HISTORY — PX: TOTAL KNEE ARTHROPLASTY: SHX125

## 2014-12-20 HISTORY — DX: Unilateral primary osteoarthritis, left knee: M17.12

## 2014-12-20 LAB — TYPE AND SCREEN
ABO/RH(D): A POS
ANTIBODY SCREEN: NEGATIVE

## 2014-12-20 LAB — ABO/RH: ABO/RH(D): A POS

## 2014-12-20 SURGERY — ARTHROPLASTY, KNEE, TOTAL
Anesthesia: Regional | Site: Knee | Laterality: Left

## 2014-12-20 MED ORDER — PRAVASTATIN SODIUM 20 MG PO TABS
20.0000 mg | ORAL_TABLET | Freq: Every day | ORAL | Status: DC
  Administered 2014-12-20: 20 mg via ORAL
  Filled 2014-12-20 (×2): qty 1

## 2014-12-20 MED ORDER — CEFAZOLIN SODIUM-DEXTROSE 2-3 GM-% IV SOLR
2.0000 g | Freq: Four times a day (QID) | INTRAVENOUS | Status: AC
  Administered 2014-12-20 – 2014-12-21 (×2): 2 g via INTRAVENOUS
  Filled 2014-12-20 (×2): qty 50

## 2014-12-20 MED ORDER — FENTANYL CITRATE 0.05 MG/ML IJ SOLN
INTRAMUSCULAR | Status: AC
  Filled 2014-12-20: qty 2

## 2014-12-20 MED ORDER — SODIUM CHLORIDE 0.9 % IR SOLN
Status: DC | PRN
  Administered 2014-12-20: 3000 mL

## 2014-12-20 MED ORDER — VANCOMYCIN HCL IN DEXTROSE 1-5 GM/200ML-% IV SOLN
INTRAVENOUS | Status: AC
  Administered 2014-12-20: 1000 mg via INTRAVENOUS
  Filled 2014-12-20: qty 200

## 2014-12-20 MED ORDER — ACETAMINOPHEN 650 MG RE SUPP: 650.0000 mg | Freq: Four times a day (QID) | RECTAL | Status: DC | PRN

## 2014-12-20 MED ORDER — FENTANYL CITRATE 0.05 MG/ML IJ SOLN
INTRAMUSCULAR | Status: AC
  Administered 2014-12-20: 75 ug via INTRAVENOUS
  Filled 2014-12-20: qty 2

## 2014-12-20 MED ORDER — SENNOSIDES-DOCUSATE SODIUM 8.6-50 MG PO TABS: 2.0000 | ORAL_TABLET | Freq: Two times a day (BID) | ORAL | Status: DC | PRN

## 2014-12-20 MED ORDER — MENTHOL 3 MG MT LOZG
1.0000 | LOZENGE | OROMUCOSAL | Status: DC | PRN
  Administered 2014-12-20: 3 mg via ORAL
  Filled 2014-12-20: qty 9

## 2014-12-20 MED ORDER — ONDANSETRON HCL 4 MG/2ML IJ SOLN: 4.0000 mg | Freq: Four times a day (QID) | INTRAMUSCULAR | Status: DC | PRN

## 2014-12-20 MED ORDER — CEFUROXIME SODIUM 1.5 G IJ SOLR
INTRAMUSCULAR | Status: DC | PRN
  Administered 2014-12-20: 1.5 g

## 2014-12-20 MED ORDER — DIPHENHYDRAMINE HCL 12.5 MG/5ML PO ELIX: 12.5000 mg | ORAL_SOLUTION | ORAL | Status: DC | PRN

## 2014-12-20 MED ORDER — ONDANSETRON HCL 4 MG/2ML IJ SOLN
INTRAMUSCULAR | Status: DC | PRN
  Administered 2014-12-20: 4 mg via INTRAVENOUS

## 2014-12-20 MED ORDER — OXYCODONE HCL ER 20 MG PO T12A
20.0000 mg | EXTENDED_RELEASE_TABLET | Freq: Two times a day (BID) | ORAL | Status: DC
  Administered 2014-12-20 – 2014-12-21 (×2): 20 mg via ORAL
  Filled 2014-12-20 (×2): qty 2

## 2014-12-20 MED ORDER — EPHEDRINE SULFATE 50 MG/ML IJ SOLN
INTRAMUSCULAR | Status: AC
  Filled 2014-12-20: qty 1

## 2014-12-20 MED ORDER — CITALOPRAM HYDROBROMIDE 20 MG PO TABS
20.0000 mg | ORAL_TABLET | Freq: Every day | ORAL | Status: DC
Start: 2014-12-21 — End: 2014-12-21
  Administered 2014-12-21: 20 mg via ORAL
  Filled 2014-12-20: qty 1

## 2014-12-20 MED ORDER — CHLORHEXIDINE GLUCONATE 4 % EX LIQD: 60.0000 mL | Freq: Once | CUTANEOUS | Status: DC

## 2014-12-20 MED ORDER — ONDANSETRON HCL 4 MG PO TABS: 4.0000 mg | ORAL_TABLET | Freq: Four times a day (QID) | ORAL | Status: DC | PRN

## 2014-12-20 MED ORDER — METOCLOPRAMIDE HCL 10 MG PO TABS: 5.0000 mg | ORAL_TABLET | Freq: Three times a day (TID) | ORAL | Status: DC | PRN

## 2014-12-20 MED ORDER — CALCIUM CARBONATE 1250 (500 CA) MG PO TABS
625.0000 mg | ORAL_TABLET | Freq: Two times a day (BID) | ORAL | Status: DC
  Administered 2014-12-20 – 2014-12-21 (×2): 625 mg via ORAL
  Filled 2014-12-20 (×4): qty 0.5

## 2014-12-20 MED ORDER — ALPRAZOLAM 0.5 MG PO TABS: 0.5000 mg | ORAL_TABLET | Freq: Three times a day (TID) | ORAL | Status: DC | PRN

## 2014-12-20 MED ORDER — SODIUM CHLORIDE 0.9 % IR SOLN
Status: DC | PRN
  Administered 2014-12-20: 1000 mL

## 2014-12-20 MED ORDER — LORATADINE 10 MG PO TABS
10.0000 mg | ORAL_TABLET | Freq: Every day | ORAL | Status: DC
  Administered 2014-12-20 – 2014-12-21 (×2): 10 mg via ORAL
  Filled 2014-12-20 (×2): qty 1

## 2014-12-20 MED ORDER — POTASSIUM CHLORIDE IN NACL 20-0.9 MEQ/L-% IV SOLN
INTRAVENOUS | Status: DC
  Administered 2014-12-20: 100 mL/h via INTRAVENOUS
  Administered 2014-12-21: 07:00:00 via INTRAVENOUS
  Filled 2014-12-20 (×5): qty 1000

## 2014-12-20 MED ORDER — PHENOL 1.4 % MT LIQD: 1.0000 | OROMUCOSAL | Status: DC | PRN

## 2014-12-20 MED ORDER — BUPIVACAINE-EPINEPHRINE 0.25% -1:200000 IJ SOLN
INTRAMUSCULAR | Status: DC | PRN
  Administered 2014-12-20: 30 mL

## 2014-12-20 MED ORDER — FENTANYL CITRATE 0.05 MG/ML IJ SOLN
INTRAMUSCULAR | Status: AC
  Filled 2014-12-20: qty 5

## 2014-12-20 MED ORDER — DEXAMETHASONE SODIUM PHOSPHATE 4 MG/ML IJ SOLN
INTRAMUSCULAR | Status: DC | PRN
  Administered 2014-12-20: 4 mg via INTRAVENOUS

## 2014-12-20 MED ORDER — HYDROMORPHONE HCL 1 MG/ML IJ SOLN
INTRAMUSCULAR | Status: AC
  Administered 2014-12-20: 15:00:00
  Filled 2014-12-20: qty 1

## 2014-12-20 MED ORDER — CEFUROXIME SODIUM 1.5 G IJ SOLR
INTRAMUSCULAR | Status: AC
  Filled 2014-12-20: qty 3

## 2014-12-20 MED ORDER — ACETAMINOPHEN 325 MG PO TABS: 650.0000 mg | ORAL_TABLET | Freq: Four times a day (QID) | ORAL | Status: DC | PRN

## 2014-12-20 MED ORDER — NEOSTIGMINE METHYLSULFATE 10 MG/10ML IV SOLN
INTRAVENOUS | Status: DC | PRN
  Administered 2014-12-20: 3 mg via INTRAVENOUS

## 2014-12-20 MED ORDER — MIDAZOLAM HCL 2 MG/2ML IJ SOLN
2.0000 mg | Freq: Once | INTRAMUSCULAR | Status: AC
  Administered 2014-12-20: 2 mg via INTRAVENOUS

## 2014-12-20 MED ORDER — PROPOFOL 10 MG/ML IV BOLUS
INTRAVENOUS | Status: AC
  Filled 2014-12-20: qty 20

## 2014-12-20 MED ORDER — VANCOMYCIN HCL IN DEXTROSE 1-5 GM/200ML-% IV SOLN
1000.0000 mg | INTRAVENOUS | Status: AC
  Administered 2014-12-20: 1000 mg via INTRAVENOUS

## 2014-12-20 MED ORDER — LACTATED RINGERS IV SOLN
INTRAVENOUS | Status: DC
  Administered 2014-12-20: 50 mL/h via INTRAVENOUS
  Administered 2014-12-20: 12:00:00 via INTRAVENOUS

## 2014-12-20 MED ORDER — MIDAZOLAM HCL 5 MG/5ML IJ SOLN
INTRAMUSCULAR | Status: DC | PRN
  Administered 2014-12-20: 2 mg via INTRAVENOUS

## 2014-12-20 MED ORDER — METOCLOPRAMIDE HCL 5 MG/ML IJ SOLN: 5.0000 mg | Freq: Three times a day (TID) | INTRAMUSCULAR | Status: DC | PRN

## 2014-12-20 MED ORDER — FENTANYL CITRATE 0.05 MG/ML IJ SOLN
25.0000 ug | INTRAMUSCULAR | Status: DC | PRN
  Administered 2014-12-20 (×3): 50 ug via INTRAVENOUS

## 2014-12-20 MED ORDER — DEXAMETHASONE SODIUM PHOSPHATE 10 MG/ML IJ SOLN
10.0000 mg | Freq: Three times a day (TID) | INTRAMUSCULAR | Status: DC
  Administered 2014-12-20 – 2014-12-21 (×3): 10 mg via INTRAVENOUS
  Filled 2014-12-20 (×5): qty 1

## 2014-12-20 MED ORDER — ONDANSETRON HCL 4 MG/2ML IJ SOLN
INTRAMUSCULAR | Status: AC
  Filled 2014-12-20: qty 2

## 2014-12-20 MED ORDER — PROPOFOL 10 MG/ML IV BOLUS
INTRAVENOUS | Status: DC | PRN
  Administered 2014-12-20: 160 mg via INTRAVENOUS

## 2014-12-20 MED ORDER — ONDANSETRON HCL 4 MG/2ML IJ SOLN: INTRAMUSCULAR | Status: DC | PRN

## 2014-12-20 MED ORDER — CELECOXIB 200 MG PO CAPS
200.0000 mg | ORAL_CAPSULE | Freq: Two times a day (BID) | ORAL | Status: DC
  Administered 2014-12-20 – 2014-12-21 (×3): 200 mg via ORAL
  Filled 2014-12-20 (×4): qty 1

## 2014-12-20 MED ORDER — ROCURONIUM BROMIDE 100 MG/10ML IV SOLN
INTRAVENOUS | Status: DC | PRN
  Administered 2014-12-20: 40 mg via INTRAVENOUS

## 2014-12-20 MED ORDER — VITAMIN D (ERGOCALCIFEROL) 1.25 MG (50000 UNIT) PO CAPS
50000.0000 [IU] | ORAL_CAPSULE | ORAL | Status: DC
  Administered 2014-12-21: 50000 [IU] via ORAL
  Filled 2014-12-20: qty 1

## 2014-12-20 MED ORDER — MIDAZOLAM HCL 2 MG/2ML IJ SOLN
INTRAMUSCULAR | Status: AC
  Filled 2014-12-20: qty 2

## 2014-12-20 MED ORDER — ZOLPIDEM TARTRATE 5 MG PO TABS
5.0000 mg | ORAL_TABLET | Freq: Every evening | ORAL | Status: DC | PRN
  Administered 2014-12-21: 5 mg via ORAL
  Filled 2014-12-20: qty 1

## 2014-12-20 MED ORDER — HYDROMORPHONE HCL 1 MG/ML IJ SOLN
0.2500 mg | INTRAMUSCULAR | Status: DC | PRN
  Administered 2014-12-20: 0.5 mg via INTRAVENOUS

## 2014-12-20 MED ORDER — FENTANYL CITRATE 0.05 MG/ML IJ SOLN
INTRAMUSCULAR | Status: DC | PRN
  Administered 2014-12-20: 50 ug via INTRAVENOUS
  Administered 2014-12-20: 100 ug via INTRAVENOUS
  Administered 2014-12-20 (×2): 50 ug via INTRAVENOUS

## 2014-12-20 MED ORDER — ALUM & MAG HYDROXIDE-SIMETH 200-200-20 MG/5ML PO SUSP: 30.0000 mL | ORAL | Status: DC | PRN

## 2014-12-20 MED ORDER — OXYCODONE HCL 5 MG PO TABS
ORAL_TABLET | ORAL | Status: AC
  Administered 2014-12-20: 14:00:00
  Filled 2014-12-20: qty 1

## 2014-12-20 MED ORDER — DOCUSATE SODIUM 100 MG PO CAPS
100.0000 mg | ORAL_CAPSULE | Freq: Two times a day (BID) | ORAL | Status: DC
  Administered 2014-12-20 – 2014-12-21 (×2): 100 mg via ORAL
  Filled 2014-12-20 (×3): qty 1

## 2014-12-20 MED ORDER — EPHEDRINE SULFATE 50 MG/ML IJ SOLN
INTRAMUSCULAR | Status: DC | PRN
  Administered 2014-12-20: 10 mg via INTRAVENOUS

## 2014-12-20 MED ORDER — POVIDONE-IODINE 7.5 % EX SOLN: Freq: Once | CUTANEOUS | Status: DC

## 2014-12-20 MED ORDER — HYDROMORPHONE HCL 1 MG/ML IJ SOLN
1.0000 mg | INTRAMUSCULAR | Status: DC | PRN
  Administered 2014-12-20 – 2014-12-21 (×2): 1 mg via INTRAVENOUS
  Filled 2014-12-20 (×2): qty 1

## 2014-12-20 MED ORDER — LIDOCAINE HCL (CARDIAC) 20 MG/ML IV SOLN
INTRAVENOUS | Status: AC
  Filled 2014-12-20: qty 5

## 2014-12-20 MED ORDER — BUPIVACAINE-EPINEPHRINE (PF) 0.25% -1:200000 IJ SOLN
INTRAMUSCULAR | Status: AC
  Filled 2014-12-20: qty 30

## 2014-12-20 MED ORDER — FENTANYL CITRATE 0.05 MG/ML IJ SOLN
100.0000 ug | Freq: Once | INTRAMUSCULAR | Status: AC
  Administered 2014-12-20: 75 ug via INTRAVENOUS

## 2014-12-20 MED ORDER — OXYCODONE HCL 5 MG PO TABS
5.0000 mg | ORAL_TABLET | ORAL | Status: DC | PRN
  Administered 2014-12-20 (×3): 5 mg via ORAL
  Administered 2014-12-21: 10 mg via ORAL
  Administered 2014-12-21 (×2): 5 mg via ORAL
  Filled 2014-12-20: qty 1
  Filled 2014-12-20: qty 2
  Filled 2014-12-20 (×3): qty 1

## 2014-12-20 MED ORDER — SODIUM CHLORIDE 0.9 % IJ SOLN
INTRAMUSCULAR | Status: AC
  Filled 2014-12-20: qty 10

## 2014-12-20 MED ORDER — GLYCOPYRROLATE 0.2 MG/ML IJ SOLN
INTRAMUSCULAR | Status: DC | PRN
  Administered 2014-12-20: 0.6 mg via INTRAVENOUS

## 2014-12-20 MED ORDER — LIDOCAINE HCL (CARDIAC) 20 MG/ML IV SOLN
INTRAVENOUS | Status: DC | PRN
  Administered 2014-12-20: 70 mg via INTRAVENOUS

## 2014-12-20 MED ORDER — MIDAZOLAM HCL 2 MG/2ML IJ SOLN
INTRAMUSCULAR | Status: AC
Start: 2014-12-20 — End: 2014-12-20
  Administered 2014-12-20: 2 mg via INTRAVENOUS
  Filled 2014-12-20: qty 2

## 2014-12-20 SURGICAL SUPPLY — 69 items
APL SKNCLS STERI-STRIP NONHPOA (GAUZE/BANDAGES/DRESSINGS) ×1
BANDAGE ESMARK 6X9 LF (GAUZE/BANDAGES/DRESSINGS) ×1 IMPLANT
BENZOIN TINCTURE PRP APPL 2/3 (GAUZE/BANDAGES/DRESSINGS) ×2 IMPLANT
BLADE SAGITTAL 25.0X1.19X90 (BLADE) ×2 IMPLANT
BLADE SAW SGTL 11.0X1.19X90.0M (BLADE) IMPLANT
BLADE SAW SGTL 13.0X1.19X90.0M (BLADE) ×2 IMPLANT
BLADE SURG 10 STRL SS (BLADE) ×4 IMPLANT
BNDG CMPR 9X6 STRL LF SNTH (GAUZE/BANDAGES/DRESSINGS) ×1
BNDG CMPR MED 15X6 ELC VLCR LF (GAUZE/BANDAGES/DRESSINGS) ×1
BNDG ELASTIC 6X15 VLCR STRL LF (GAUZE/BANDAGES/DRESSINGS) ×2 IMPLANT
BNDG ESMARK 6X9 LF (GAUZE/BANDAGES/DRESSINGS) ×2
BOWL SMART MIX CTS (DISPOSABLE) ×2 IMPLANT
CAP KNEE TOTAL 3 SIGMA ×2 IMPLANT
CEMENT HV SMART SET (Cement) ×4 IMPLANT
COVER SURGICAL LIGHT HANDLE (MISCELLANEOUS) ×2 IMPLANT
CUFF TOURNIQUET SINGLE 34IN LL (TOURNIQUET CUFF) ×2 IMPLANT
CUFF TOURNIQUET SINGLE 44IN (TOURNIQUET CUFF) IMPLANT
DRAPE EXTREMITY T 121X128X90 (DRAPE) ×2 IMPLANT
DRAPE IMP U-DRAPE 54X76 (DRAPES) ×2 IMPLANT
DRAPE INCISE IOBAN 66X45 STRL (DRAPES) ×2 IMPLANT
DRAPE PROXIMA HALF (DRAPES) ×2 IMPLANT
DRAPE U-SHAPE 47X51 STRL (DRAPES) ×2 IMPLANT
DRSG AQUACEL AG ADV 3.5X14 (GAUZE/BANDAGES/DRESSINGS) ×2 IMPLANT
DRSG PAD ABDOMINAL 8X10 ST (GAUZE/BANDAGES/DRESSINGS) ×4 IMPLANT
DURAPREP 26ML APPLICATOR (WOUND CARE) ×4 IMPLANT
ELECT CAUTERY BLADE 6.4 (BLADE) ×2 IMPLANT
ELECT REM PT RETURN 9FT ADLT (ELECTROSURGICAL) ×2
ELECTRODE REM PT RTRN 9FT ADLT (ELECTROSURGICAL) ×1 IMPLANT
EVACUATOR 1/8 PVC DRAIN (DRAIN) ×2 IMPLANT
FACESHIELD WRAPAROUND (MASK) ×2 IMPLANT
GAUZE SPONGE 4X4 12PLY STRL (GAUZE/BANDAGES/DRESSINGS) ×2 IMPLANT
GLOVE BIO SURGEON STRL SZ7 (GLOVE) ×2 IMPLANT
GLOVE BIOGEL PI IND STRL 7.0 (GLOVE) ×1 IMPLANT
GLOVE BIOGEL PI IND STRL 7.5 (GLOVE) ×1 IMPLANT
GLOVE BIOGEL PI INDICATOR 7.0 (GLOVE) ×1
GLOVE BIOGEL PI INDICATOR 7.5 (GLOVE) ×1
GLOVE SS BIOGEL STRL SZ 7.5 (GLOVE) ×1 IMPLANT
GLOVE SUPERSENSE BIOGEL SZ 7.5 (GLOVE) ×1
GOWN STRL REUS W/ TWL LRG LVL3 (GOWN DISPOSABLE) ×2 IMPLANT
GOWN STRL REUS W/ TWL XL LVL3 (GOWN DISPOSABLE) ×2 IMPLANT
GOWN STRL REUS W/TWL LRG LVL3 (GOWN DISPOSABLE) ×4
GOWN STRL REUS W/TWL XL LVL3 (GOWN DISPOSABLE) ×4
HANDPIECE INTERPULSE COAX TIP (DISPOSABLE) ×2
HOOD PEEL AWAY FACE SHEILD DIS (HOOD) ×6 IMPLANT
IMMOBILIZER KNEE 22 UNIV (SOFTGOODS) IMPLANT
KIT BASIN OR (CUSTOM PROCEDURE TRAY) ×2 IMPLANT
KIT ROOM TURNOVER OR (KITS) ×2 IMPLANT
MANIFOLD NEPTUNE II (INSTRUMENTS) ×2 IMPLANT
MARKER SKIN DUAL TIP RULER LAB (MISCELLANEOUS) ×2 IMPLANT
NS IRRIG 1000ML POUR BTL (IV SOLUTION) ×2 IMPLANT
PACK TOTAL JOINT (CUSTOM PROCEDURE TRAY) ×2 IMPLANT
PACK UNIVERSAL I (CUSTOM PROCEDURE TRAY) ×2 IMPLANT
PAD ARMBOARD 7.5X6 YLW CONV (MISCELLANEOUS) ×2 IMPLANT
PADDING CAST COTTON 6X4 STRL (CAST SUPPLIES) ×2 IMPLANT
RUBBERBAND STERILE (MISCELLANEOUS) ×2 IMPLANT
SET HNDPC FAN SPRY TIP SCT (DISPOSABLE) ×1 IMPLANT
STRIP CLOSURE SKIN 1/2X4 (GAUZE/BANDAGES/DRESSINGS) ×2 IMPLANT
SUCTION FRAZIER TIP 10 FR DISP (SUCTIONS) ×2 IMPLANT
SUT ETHIBOND NAB CT1 #1 30IN (SUTURE) ×2 IMPLANT
SUT MNCRL AB 3-0 PS2 18 (SUTURE) ×2 IMPLANT
SUT VIC AB 0 CT1 27 (SUTURE) ×4
SUT VIC AB 0 CT1 27XBRD ANBCTR (SUTURE) ×2 IMPLANT
SUT VIC AB 2-0 CT1 27 (SUTURE) ×4
SUT VIC AB 2-0 CT1 TAPERPNT 27 (SUTURE) ×2 IMPLANT
SYR 30ML SLIP (SYRINGE) ×2 IMPLANT
TOWEL OR 17X24 6PK STRL BLUE (TOWEL DISPOSABLE) ×2 IMPLANT
TOWEL OR 17X26 10 PK STRL BLUE (TOWEL DISPOSABLE) ×2 IMPLANT
TRAY FOLEY CATH 16FR SILVER (SET/KITS/TRAYS/PACK) ×2 IMPLANT
WATER STERILE IRR 1000ML POUR (IV SOLUTION) IMPLANT

## 2014-12-20 NOTE — Anesthesia Procedure Notes (Addendum)
Procedure Name: Intubation Date/Time: 12/20/2014 11:02 AM Performed by: Maryella Shivers Pre-anesthesia Checklist: Patient identified, Emergency Drugs available, Suction available and Patient being monitored Patient Re-evaluated:Patient Re-evaluated prior to inductionOxygen Delivery Method: Circle System Utilized Preoxygenation: Pre-oxygenation with 100% oxygen Intubation Type: IV induction Ventilation: Mask ventilation without difficulty Laryngoscope Size: Mac and 3 Grade View: Grade I Tube type: Oral Tube size: 7.5 mm Number of attempts: 1 Airway Equipment and Method: stylet and oral airway Placement Confirmation: ETT inserted through vocal cords under direct vision,  positive ETCO2 and breath sounds checked- equal and bilateral Secured at: 21 cm Tube secured with: Tape Dental Injury: Teeth and Oropharynx as per pre-operative assessment

## 2014-12-20 NOTE — Transfer of Care (Signed)
Immediate Anesthesia Transfer of Care Note  Patient: Nancy Downs  Procedure(s) Performed: Procedure(s): LEFT TOTAL KNEE ARTHROPLASTY (Left)  Patient Location: PACU  Anesthesia Type:General  Level of Consciousness: awake, alert  and oriented  Airway & Oxygen Therapy: Patient Spontanous Breathing and Patient connected to nasal cannula oxygen  Post-op Assessment: Report given to PACU RN, Post -op Vital signs reviewed and stable and Patient moving all extremities X 4  Post vital signs: Reviewed and stable  Complications: No apparent anesthesia complications

## 2014-12-20 NOTE — Progress Notes (Signed)
Top dentures returned to pt

## 2014-12-20 NOTE — Anesthesia Postprocedure Evaluation (Signed)
  Anesthesia Post-op Note  Patient: Nancy Downs  Procedure(s) Performed: Procedure(s): LEFT TOTAL KNEE ARTHROPLASTY (Left)  Patient Location: PACU  Anesthesia Type:General  Level of Consciousness: awake  Airway and Oxygen Therapy: Patient Spontanous Breathing  Post-op Pain: mild  Post-op Assessment: Post-op Vital signs reviewed  Post-op Vital Signs: Reviewed  Last Vitals:  Filed Vitals:   12/20/14 1530  BP: 118/70  Pulse: 68  Temp: 36.1 C  Resp: 20    Complications: No apparent anesthesia complications

## 2014-12-20 NOTE — Op Note (Signed)
MRN:     [id number] DOB/AGE:    10-Sep-1936 / 79 y.o.       OPERATIVE REPORT    DATE OF PROCEDURE:  12/20/2014       PREOPERATIVE DIAGNOSIS: PRIMARY LOCALIZE  OA LEFT KNEE      Estimated body mass index is 30.06 kg/(m^2) as calculated from the following:   Height as of this encounter: 5\' 7"  (1.702 m).   Weight as of this encounter: 87.091 kg (192 lb).                                                        POSTOPERATIVE DIAGNOSIS:   SAME                                                                   PROCEDURE:  Procedure(s): LEFT TOTAL KNEE ARTHROPLASTY Using Depuy Sigma RP implants #4Narrow Femur, #4Tibia, 12.73mm sigma RP bearing, 32 Patella     SURGEON: Hillard Goodwine A    ASSISTANT:  Kirstin Shepperson PA-C   (Present and scrubbed throughout the case, critical for assistance with exposure, retraction, instrumentation, and closure.)         ANESTHESIA: GET with Femoral Nerve Block  DRAINS: foley, 2 medium hemovac in knee   TOURNIQUET TIME: [id number]   COMPLICATIONS:  None     SPECIMENS: None   INDICATIONS FOR PROCEDURE: The patient has  OA LEFT KNEE, varus deformities, XR shows bone on bone arthritis. Patient has failed all conservative measures including anti-inflammatory medicines, narcotics, attempts at  exercise and weight loss, cortisone injections and viscosupplementation.  Risks and benefits of surgery have been discussed, questions answered.   DESCRIPTION OF PROCEDURE: The patient identified by armband, received  right femoral nerve block and IV antibiotics, in the holding area at [facility]. Patient taken to the operating room, appropriate anesthetic  monitors were attached General endotracheal anesthesia induced with  the patient in supine position, Foley catheter was inserted. Tourniquet  applied high to the operative thigh. Lateral post and foot positioner  applied to the table, the lower extremity was then prepped and draped  in usual sterile fashion from the  ankle to the tourniquet. Time-out procedure was performed. The limb was wrapped with an Esmarch bandage and the tourniquet inflated to 365 mmHg. We began the operation by making the anterior midline incision starting at handbreadth above the patella going over the patella 1 cm medial to and  4 cm distal to the tibial tubercle. Small bleeders in the skin and the  subcutaneous tissue identified and cauterized. Transverse retinaculum was incised and reflected medially and a medial parapatellar arthrotomy was accomplished. the patella was everted and theprepatellar fat pad resected. The superficial medial collateral  ligament was then elevated from anterior to posterior along the proximal  flare of the tibia and anterior half of the menisci resected. The knee was hyperflexed exposing bone on bone arthritis. Peripheral and notch osteophytes as well as the cruciate ligaments were then resected. We continued to  work our way around posteriorly along the proximal tibia, and externally  rotated the  tibia subluxing it out from underneath the femur. A McHale  retractor was placed through the notch and a lateral Hohmann retractor  placed, and we then drilled through the proximal tibia in line with the  axis of the tibia followed by an intramedullary guide rod and 2-degree  posterior slope cutting guide. The tibial cutting guide was pinned into place  allowing resection of 6 mm of bone medially and about 4 mm of bone  laterally because of her valgus deformity. Satisfied with the tibial resection, we then  entered the distal femur 2 mm anterior to the PCL origin with the  intramedullary guide rod and applied the distal femoral cutting guide  set at 23mm, with 5 degrees of valgus. This was pinned along the  epicondylar axis. At this point, the distal femoral cut was accomplished without difficulty. We then sized for a #4Narrow femoral component and pinned the guide in 3 degrees of external rotation.The chamfer  cutting guide was pinned into place. The anterior, posterior, and chamfer cuts were accomplished without difficulty followed by  the Sigma RP box cutting guide and the box cut. We also removed posterior osteophytes from the posterior femoral condyles. At this  time, the knee was brought into full extension. We checked our  extension and flexion gaps and found them symmetric at 12.26mm.  The patella thickness measured at 22 mm. We set the cutting guide at 13 and removed the posterior 9.5 mm  of the patella sized for 32 button and drilled the lollipop. The knee  was then once again hyperflexed exposing the proximal tibia. We sized for a #4 tibial base plate, applied the smokestack and the conical reamer followed by the the Delta fin keel punch. We then hammered into place the Sigma RP trial femoral component, inserted a 12.5 mm trial bearing, trial patellar button, and took the knee through range of motion from 0-130 degrees. No thumb pressure was required for patellar  tracking. At this point, all trial components were removed, a double batch of DePuy HV cement with 1500 mg of Zinacef was mixed and applied to all bony metallic mating surfaces except for the posterior condyles of the femur itself. In order, we  hammered into place the tibial tray and removed excess cement, the femoral component and removed excess cement, a 12.5-mm Sigma RP bearing  was inserted, and the knee brought to full extension with compression.  The patellar button was clamped into place, and excess cement  removed. While the cement cured the wound was irrigated out with normal saline solution pulse lavage, and medium Hemovac drains were placed.. Ligament stability and patellar tracking were checked and found to be excellent. The tourniquet was then released and hemostasis was obtained with cautery. The parapatellar arthrotomy was closed with  #1 ethibond suture. The subcutaneous tissue with 0 and 2-0 undyed  Vicryl suture, and 4-0  Monocryl.. A dressing of Xeroform,  4 x 4, dressing sponges, Webril, and Ace wrap applied. Needle and sponge count were correct times 2.The patient awakened, extubated, and taken to recovery room without difficulty. Vascular status was normal, pulses 2+ and symmetric.   Mykaylah Ballman A 12/20/2014, 12:26 PM

## 2014-12-20 NOTE — Anesthesia Preprocedure Evaluation (Signed)
Anesthesia Evaluation  Patient identified by MRN, date of birth, ID band Patient awake    History of Anesthesia Complications (+) PONV  Airway Mallampati: II  TM Distance: >3 FB Neck ROM: Full    Dental   Pulmonary neg pulmonary ROS,  breath sounds clear to auscultation        Cardiovascular negative cardio ROS  Rhythm:Regular Rate:Normal     Neuro/Psych    GI/Hepatic negative GI ROS, Neg liver ROS,   Endo/Other  negative endocrine ROS  Renal/GU negative Renal ROS     Musculoskeletal   Abdominal   Peds  Hematology   Anesthesia Other Findings   Reproductive/Obstetrics                             Anesthesia Physical Anesthesia Plan  ASA: II  Anesthesia Plan: Spinal and Regional   Post-op Pain Management:    Induction: Intravenous  Airway Management Planned: Simple Face Mask  Additional Equipment:   Intra-op Plan:   Post-operative Plan:   Informed Consent: I have reviewed the patients History and Physical, chart, labs and discussed the procedure including the risks, benefits and alternatives for the proposed anesthesia with the patient or authorized representative who has indicated his/her understanding and acceptance.     Plan Discussed with: CRNA, Anesthesiologist and Surgeon  Anesthesia Plan Comments:         Anesthesia Quick Evaluation

## 2014-12-20 NOTE — Interval H&P Note (Signed)
History and Physical Interval Note:  12/20/2014 10:38 AM  Nancy Downs  has presented today for surgery, with the diagnosis of PRIMARY LOCALIZED OA LEFT KNEE  The various methods of treatment have been discussed with the patient and family. After consideration of risks, benefits and other options for treatment, the patient has consented to  Procedure(s): LEFT TOTAL KNEE ARTHROPLASTY (Left) as a surgical intervention .  The patient's history has been reviewed, patient examined, no change in status, stable for surgery.  I have reviewed the patient's chart and labs.  Questions were answered to the patient's satisfaction.     Elsie Saas A

## 2014-12-20 NOTE — Progress Notes (Signed)
Utilization review completed.  

## 2014-12-20 NOTE — Progress Notes (Signed)
Physical Therapy Evaluation Patient Details Name: Nancy Downs MRN: [id number] DOB: 01/17/1936 Today's Date: 12/20/2014   History of Present Illness  Patient is a 79 yo female admitted 12/20/14 now s/p Lt TKA.  PMH:  anemia, HLD, arthritis, back pain.  Clinical Impression  Patient presents with problems listed below.  Will benefit from acute PT to maximize independence prior to return home with assist from family.    Follow Up Recommendations Home health PT;Supervision/Assistance - 24 hour    Equipment Recommendations  Rolling walker with 5" wheels    Recommendations for Other Services       Precautions / Restrictions Precautions Precautions: Knee Precaution Booklet Issued: Yes (comment) Precaution Comments: Reviewed precautions with patient and daughter. Required Braces or Orthoses: Knee Immobilizer - Left Knee Immobilizer - Left: On when out of bed or walking;Discontinue once straight leg raise with < 10 degree lag Restrictions Weight Bearing Restrictions: Yes LLE Weight Bearing: Weight bearing as tolerated      Mobility  Bed Mobility Overal bed mobility: Needs Assistance Bed Mobility: Supine to Sit     Supine to sit: Min assist     General bed mobility comments: Instructed patient and daughter on donning KI on LLE.  Verbal cues for bed mobility.  Assist to bring LLE off of bed.  Patient able to scoot to EOB using UE's.  Good balance in sitting.    Transfers Overall transfer level: Needs assistance Equipment used: Rolling walker (2 wheeled) Transfers: Sit to/from [facility] Sit to Stand: Mod assist Stand pivot transfers: Min assist       General transfer comment: Verbal cues for hand placement and technique.  Assist to rise to standing.  Patient able to take several steps to pivot to chair.  Assist to control descent into chair.  Ambulation/Gait                Stairs            Wheelchair Mobility    Modified Rankin (Stroke  Patients Only)       Balance                                             Pertinent Vitals/Pain Pain Assessment: 0-10 Pain Score: 5  Pain Location: Lt knee Pain Descriptors / Indicators: Aching;Sharp Pain Intervention(s): Repositioned;Patient requesting pain meds-RN notified    Home Living Family/patient expects to be discharged to:: Private residence Living Arrangements: Alone Available Help at Discharge: Family;Available 24 hours/day (Daughters/family 24 hours for 2 weeks) Type of Home: House Home Access: Level entry     Home Layout: One level Home Equipment: Walker - 4 wheels;Bedside commode;Shower seat (No back on shower seat)      Prior Function Level of Independence: Independent         Comments: Patient has high bed with step to get onto it     Hand Dominance        Extremity/Trunk Assessment   Upper Extremity Assessment: Overall WFL for tasks assessed           Lower Extremity Assessment: LLE deficits/detail   LLE Deficits / Details: Decreased strength and ROM post-op.      Communication   Communication: No difficulties  Cognition Arousal/Alertness: Awake/alert Behavior During Therapy: WFL for tasks assessed/performed Overall Cognitive Status: Within Functional Limits for tasks assessed  General Comments      Exercises Total Joint Exercises Ankle Circles/Pumps: AROM;Both;10 reps;Seated      Assessment/Plan    PT Assessment Patient needs continued PT services  PT Diagnosis Difficulty walking;Generalized weakness;Acute pain   PT Problem List Decreased strength;Decreased range of motion;Decreased balance;Decreased activity tolerance;Decreased mobility;Decreased knowledge of use of DME;Decreased knowledge of precautions;Pain  PT Treatment Interventions DME instruction;Gait training;Functional mobility training;Therapeutic activities;Therapeutic exercise;Patient/family education   PT Goals  (Current goals can be found in the Care Plan section) Acute Rehab PT Goals Patient Stated Goal: To go home soon PT Goal Formulation: With patient/family Time For Goal Achievement: 12/27/14 Potential to Achieve Goals: Good    Frequency 7X/week   Barriers to discharge        Co-evaluation               End of Session Equipment Utilized During Treatment: Gait belt;Left knee immobilizer;Oxygen Activity Tolerance: Patient limited by pain Patient left: in chair;with call bell/phone within reach;with family/visitor present Nurse Communication: Mobility status;Patient requests pain meds         Time: [id number] PT Time Calculation (min) (ACUTE ONLY): 50 min   Charges:   PT Evaluation $Initial PT Evaluation Tier I: 1 Procedure PT Treatments $Therapeutic Activity: 23-37 mins   PT G Codes:        Despina Pole 2015/01/17, 7:10 PM Carita Pian. Sanjuana Kava, [place] Pager [phone]

## 2014-12-20 NOTE — Progress Notes (Signed)
Orthopedic Tech Progress Note Patient Details:  Nancy Downs Apr 28, 1936 [id number] Applied CPM to LLE. Applied OHF with trapeze to pt.'s bed.  Left Bone Foam with pt.'s nurse. CPM Left Knee CPM Left Knee: On Left Knee Flexion (Degrees): 90 Left Knee Extension (Degrees): 0   Darrol Poke 12/20/2014, 1:47 PM

## 2014-12-21 ENCOUNTER — Encounter (HOSPITAL_COMMUNITY): Payer: Self-pay | Admitting: Orthopedic Surgery

## 2014-12-21 LAB — CBC
HEMATOCRIT: 29 % — AB (ref 36.0–46.0)
HEMOGLOBIN: 9.7 g/dL — AB (ref 12.0–15.0)
MCH: 31.3 pg (ref 26.0–34.0)
MCHC: 33.4 g/dL (ref 30.0–36.0)
MCV: 93.5 fL (ref 78.0–100.0)
Platelets: 176 10*3/uL (ref 150–400)
RBC: 3.1 MIL/uL — ABNORMAL LOW (ref 3.87–5.11)
RDW: 12.9 % (ref 11.5–15.5)
WBC: 10.4 10*3/uL (ref 4.0–10.5)

## 2014-12-21 LAB — BASIC METABOLIC PANEL
Anion gap: 6 (ref 5–15)
BUN: 14 mg/dL (ref 6–23)
CHLORIDE: 103 meq/L (ref 96–112)
CO2: 25 mmol/L (ref 19–32)
CREATININE: 0.67 mg/dL (ref 0.50–1.10)
Calcium: 8.1 mg/dL — ABNORMAL LOW (ref 8.4–10.5)
GFR calc Af Amer: >90 mL/min (ref >90)
GFR calc non Af Amer: 83 mL/min — ABNORMAL LOW (ref >90)
GLUCOSE: 152 mg/dL — AB (ref 70–99)
Potassium: 4.2 mmol/L (ref 3.5–5.1)
Sodium: 134 mmol/L — ABNORMAL LOW (ref 135–145)

## 2014-12-21 MED ORDER — CELECOXIB 200 MG PO CAPS: 200.0000 mg | ORAL_CAPSULE | Freq: Every day | ORAL | Status: AC

## 2014-12-21 MED ORDER — OXYCODONE HCL 5 MG PO TABS: ORAL_TABLET | ORAL | Status: DC

## 2014-12-21 MED ORDER — ASPIRIN EC 325 MG PO TBEC: DELAYED_RELEASE_TABLET | ORAL | Status: DC

## 2014-12-21 MED ORDER — OXYCODONE HCL ER 20 MG PO T12A
EXTENDED_RELEASE_TABLET | ORAL | Status: DC
Start: 2014-12-21 — End: 2016-04-11

## 2014-12-21 NOTE — Progress Notes (Signed)
Occupational Therapy Evaluation Patient Details Name: Nancy Downs MRN: [id number] DOB: [birth year] Today's Date: 12/21/2014    History of Present Illness Patient is a 79 yo female admitted 12/20/14 now s/p Lt TKA.  PMH:  anemia, HLD, arthritis, back pain.   Clinical Impression   Completed all education for ADL, compensatory techniques and use of DME for functional mobility for ADL. Pt/daughter demonstrated understanding. No further OT needs. Ready to D/C after next PT session.     Follow Up Recommendations  No OT follow up;Supervision/Assistance - 24 hour (initially)    Equipment Recommendations  None recommended by OT    Recommendations for Other Services       Precautions / Restrictions Precautions Precautions: Knee Required Braces or Orthoses:  (d/c during therapy session today) Knee Immobilizer - Left: On when out of bed or walking;Discontinue once straight leg raise with < 10 degree lag Restrictions LLE Weight Bearing: Weight bearing as tolerated      Mobility Bed Mobility Overal bed mobility: Modified Independent Bed Mobility: Sit to Supine;Supine to Sit     Supine to sit: Modified independent (Device/Increase time) Sit to supine: Modified independent (Device/Increase time)   Demonstrated technique of using RLE to assist with moving LLE into bed  Transfers Overall transfer level: Needs assistance Equipment used: Rolling walker (2 wheeled) Transfers: Sit to/from Stand Sit to Stand: Supervision            Balance Overall balance assessment: Needs assistance         Standing balance support: During functional activity Standing balance-Leahy Scale: Fair                              ADL Overall ADL's : Needs assistance/impaired     Grooming: Set up   Upper Body Bathing: Set up   Lower Body Bathing: Minimal assistance;Sit to/from stand   Upper Body Dressing : Set up;Sitting   Lower Body Dressing: Minimal assistance;Sit to/from stand   Toilet Transfer: Supervision/safety;Ambulation;Comfort height toilet   Toileting- Clothing Manipulation and Hygiene: Supervision/safety;Sit to/from stand       Functional mobility during ADLs: Supervision/safety;Rolling walker General ADL Comments: Educated family on functional mobility for ADL. Daughter able to return demonstrate. Educated on use of 3 in 1 for bedside/over toilet and in shower Daughter verbalized understanding of shower transfer technique     Vision                     Perception     Praxis      Pertinent Vitals/Pain Pain Assessment: 0-10 Pain Score: 3  Pain Location: L knee Pain Descriptors / Indicators: Aching Pain Intervention(s): Limited activity within patient's tolerance;Monitored during session;Repositioned     Hand Dominance Right   Extremity/Trunk Assessment Upper Extremity Assessment Upper Extremity Assessment: Overall WFL for tasks assessed   Lower Extremity Assessment Lower Extremity Assessment: Defer to PT evaluation   Cervical / Trunk Assessment Cervical / Trunk Assessment: Normal   Communication Communication Communication: No difficulties   Cognition Arousal/Alertness: Awake/alert Behavior During Therapy: WFL for tasks assessed/performed Overall Cognitive Status: Within Functional Limits for tasks assessed                     General Comments   educated on importance of terminal knee extension in addition to using the LLE "functionally"    Exercises Exercises: Total Joint     Shoulder Instructions  Home Living Family/patient expects to be discharged to:: Private residence Living Arrangements: Alone Available Help at Discharge: Family;Available 24 hours/day Type of Home: House Home Access: Level entry     Home Layout: One level     Bathroom Shower/Tub: Occupational psychologist: Handicapped height Bathroom Accessibility: Yes How Accessible: Accessible via walker Home Equipment: [place] -  4 wheels;Bedside commode;Shower seat          Prior Functioning/Environment Level of Independence: Independent        Comments: Patient has high bed with step to get onto it  [facility], OTR/L  [id number] 2015-01-15  OT Diagnosis: Generalized weakness;Acute pain   OT Problem List: Decreased strength;Decreased range of motion;Decreased activity tolerance;Decreased knowledge of use of DME or AE;Pain;Obesity   OT Treatment/Interventions:      OT Goals(Current goals can be found in the care plan section) Acute Rehab OT Goals Patient Stated Goal: To go home soon  OT Frequency:     Barriers to D/C:            Co-evaluation              End of Session Equipment Utilized During Treatment: Gait belt;Rolling walker CPM Left Knee CPM Left Knee: Off Nurse Communication: Mobility status  Activity Tolerance: Patient tolerated treatment well Patient left: in bed;with call bell/phone within reach;with family/visitor present   Time: [id number] OT Time Calculation (min): 41 min Charges:  OT General Charges $OT Visit: 1 Procedure OT Evaluation $Initial OT Evaluation Tier I: 1 Procedure OT Treatments $Self Care/Home Management : 23-37 mins G-Codes:    Naveen Lorusso,HILLARY 01/15/2015, 2:03 PM

## 2014-12-21 NOTE — Discharge Summary (Signed)
Patient ID: Nancy Downs MRN: [id number] DOB/AGE: 1936-05-02 79 y.o.  Admit date: 12/20/2014 Discharge date: 12/21/2014  Admission Diagnoses:  Principal Problem:   Primary localized osteoarthritis of left knee Active Problems:   Anemia   HLD (hyperlipidemia)   UTI (lower urinary tract infection)   Ovarian carcinosarcoma   DJD (degenerative joint disease) of knee   Discharge Diagnoses:  Same  Past Medical History  Diagnosis Date  . Hyperlipidemia   . Arthritis   . Anemia   . Cough   . Constipation   . Easy bruising   . Fatigue   . Bronchitis 08/2011  . Cancer     liver  . Primary localized osteoarthritis of left knee   . PONV (postoperative nausea and vomiting)     happened just with knee surgery  2010  . Anxiety     Surgeries: Procedure(s): LEFT TOTAL KNEE ARTHROPLASTY on 12/20/2014   Consultants:    Discharged Condition: Improved  Hospital Course: RUWAIDA WILDING is an 79 y.o. female who was admitted 12/20/2014 for operative treatment ofPrimary localized osteoarthritis of left knee. Patient has severe unremitting pain that affects sleep, daily activities, and work/hobbies. After pre-op clearance the patient was taken to the operating room on 12/20/2014 and underwent  Procedure(s): LEFT TOTAL KNEE ARTHROPLASTY.    Patient was given perioperative antibiotics: Anti-infectives    Start     Dose/Rate Route Frequency Ordered Stop   12/20/14 1800  ceFAZolin (ANCEF) IVPB 2 g/50 mL premix     2 g100 mL/hr over 30 Minutes Intravenous Every 6 hours 12/20/14 1539 12/21/14 0038   12/20/14 1126  cefUROXime (ZINACEF) injection  Status:  Discontinued       As needed 12/20/14 1127 12/20/14 1257   12/20/14 0843  vancomycin (VANCOCIN) IVPB 1000 mg/200 mL premix     1,000 mg200 mL/hr over 60 Minutes Intravenous 60 min pre-op 12/20/14 0843 12/20/14 1007   12/20/14 0600  ceFAZolin (ANCEF) IVPB 2 g/50 mL premix     2 g100 mL/hr over 30 Minutes Intravenous On call to O.R. 12/19/14 1342  12/20/14 1103       Patient was given sequential compression devices, early ambulation, and chemoprophylaxis to prevent DVT.  Patient benefited maximally from hospital stay and there were no complications.    Recent vital signs: Patient Vitals for the past 24 hrs:  BP Temp Temp src Pulse Resp SpO2  12/21/14 0555 123/70 mmHg 98.1 F (36.7 C) Oral 69 - 95 %  12/21/14 0400 - - - - 18 97 %  12/21/14 0205 115/70 mmHg 97 F (36.1 C) Oral 75 - 100 %  12/21/14 0000 - - - - 18 94 %  12/20/14 2216 (!) 117/59 mmHg 98 F (36.7 C) Oral 74 - 95 %  12/20/14 2000 - - - - 18 93 %  12/20/14 1530 118/70 mmHg 97 F (36.1 C) Oral 68 20 100 %  12/20/14 1505 118/63 mmHg 97.5 F (36.4 C) - 66 18 100 %  12/20/14 1503 - - - 62 19 100 %  12/20/14 1500 - - - 62 (!) 9 100 %  12/20/14 1448 129/88 mmHg - - 71 17 96 %  12/20/14 1445 - - - 64 15 99 %  12/20/14 1442 - - - 65 13 100 %  12/20/14 1430 - - - 64 (!) 9 100 %  12/20/14 1415 - - - 63 17 99 %  12/20/14 1400 - - - 75 11 100 %  12/20/14 1357 - - -  66 12 100 %  12/20/14 1345 - - - 69 (!) 9 100 %  12/20/14 1330 - - - 72 11 99 %  12/20/14 1315 (!) 118/56 mmHg - - 66 14 100 %  12/20/14 1300 121/67 mmHg 97.4 F (36.3 C) - 73 16 100 %  12/20/14 1020 (!) 112/52 mmHg - - 73 (!) 23 97 %  12/20/14 1015 (!) 106/53 mmHg - - 74 18 94 %  12/20/14 1010 (!) 112/57 mmHg - - 71 20 99 %  12/20/14 1000 (!) 109/55 mmHg - - 74 (!) 22 97 %  12/20/14 0950 (!) 113/54 mmHg - - 82 19 97 %  12/20/14 0904 110/65 mmHg - Oral 98 16 97 %     Recent laboratory studies:  Recent Labs  12/21/14 0611  WBC 10.4  HGB 9.7*  HCT 29.0*  PLT 176  NA 134*  K 4.2  CL 103  CO2 25  BUN 14  CREATININE 0.67  GLUCOSE 152*  CALCIUM 8.1*     Discharge Medications:     Medication List    STOP taking these medications        aspirin 81 MG tablet  Replaced by:  aspirin EC 325 MG tablet     BIOTIN MAXIMUM STRENGTH 10 MG Tabs  Generic drug:  Biotin     Evening Primrose Oil  1000 MG Caps     HYDROcodone-acetaminophen 7.5-325 MG per tablet  Commonly known as:  NORCO     ibuprofen 800 MG tablet  Commonly known as:  ADVIL,MOTRIN      TAKE these medications        ALPRAZolam 0.5 MG tablet  Commonly known as:  XANAX  Take 0.5 mg by mouth 3 (three) times daily as needed. For anxiety     aspirin EC 325 MG tablet  1 tab a day for the next 30 days to prevent blood clots     calcium carbonate 600 MG Tabs tablet  Commonly known as:  OS-CAL  Take 600 mg by mouth 2 (two) times daily with a meal.     celecoxib 200 MG capsule  Commonly known as:  CELEBREX  Take 1 capsule (200 mg total) by mouth daily.     citalopram 20 MG tablet  Commonly known as:  CELEXA  Take 20 mg by mouth every morning.     clobetasol cream 0.05 %  Commonly known as:  TEMOVATE  Apply 1 application topically 2 (two) times daily as needed (skin rash on legs).     ergocalciferol 50000 UNITS capsule  Commonly known as:  VITAMIN D2  Take 50,000 Units by mouth once a week. Tuesday     FISH OIL PO  Take 1 capsule by mouth daily.     FLAXSEED OIL PO  Take 1,200 mg by mouth daily.     loratadine 10 MG tablet  Commonly known as:  CLARITIN  Take 10 mg by mouth daily.     lovastatin 20 MG tablet  Commonly known as:  MEVACOR  Take 20 mg by mouth at bedtime.     oxyCODONE 5 MG immediate release tablet  Commonly known as:  Oxy IR/ROXICODONE  1-2 tablets every 3-4 hrs as needed for pain SHORT ACTING PAIN MEDICATION     OxyCODONE 20 mg T12a 12 hr tablet  Commonly known as:  OXYCONTIN  1 tablet every 12 hrs.  LONG ACTING PAIN MEDICATION     sennosides-docusate sodium 8.6-50 MG tablet  Commonly known  as:  SENOKOT-S  - Take 2 tablets by mouth 2 (two) times daily as needed for constipation. For Constipation  -   -      zolpidem 10 MG tablet  Commonly known as:  AMBIEN  Take 10 mg by mouth at bedtime as needed for sleep.        Diagnostic Studies: No results  found.  Disposition: 01-Home or Self Care      Discharge Instructions    CPM    Complete by:  As directed   Continuous passive motion machine (CPM):      Use the CPM from 0 to 90 for 6 hours per day.       You may break it up into 2 or 3 sessions per day.      Use CPM for 2 weeks or until you are told to stop.     Call MD / Call 911    Complete by:  As directed   If you experience chest pain or shortness of breath, CALL 911 and be transported to the hospital emergency room.  If you develope a fever above 101 F, pus (white drainage) or increased drainage or redness at the wound, or calf pain, call your surgeon's office.     Change dressing    Complete by:  As directed   Change guaze daily until drainage is no longer coming from drain holes.  DO NOT REMOVE BANDAGE OVER SURGICAL INCISION.  [place] WHOLE LEG INCLUDING OVER THE WATERPROOF BANDAGE WITH SOAP AND WATER EVERY DAY.     Constipation Prevention    Complete by:  As directed   Drink plenty of fluids.  Prune juice may be helpful.  You may use a stool softener, such as Colace (over the counter) 100 mg twice a day.  Use MiraLax (over the counter) for constipation as needed.     Diet - low sodium heart healthy    Complete by:  As directed      Discharge instructions    Complete by:  As directed   Total Knee Replacement Care After Refer to this sheet in the next few weeks. These discharge instructions provide you with general information on caring for yourself after you leave the hospital. Your caregiver may also give you specific instructions. Your treatment has been planned according to the most current medical practices available, but unavoidable complications sometimes occur. If you have any problems or questions after discharge, please call your caregiver. Regaining a near full range of motion of your knee within the first 3 to 6 weeks after surgery is critical. [place] may resume a normal diet and activities as  directed.  Perform exercises as directed.  Place gray foam block, curve side up under heel at all times except when in CPM or when walking.  DO NOT modify, tear, cut, or change in any way the gray foam block. You will receive physical therapy daily  Take showers instead of baths until informed otherwise.  You may shower on Friday.  Please wash whole leg including wound with soap and water over aquacel dressing.  DO NOT REMOVE AQUACEL DRESSING.  DR Noemi Chapel WILL TAKE IT OFF IN THE OFFICE AT THE POST OP APPOINTMENT  It is OK to take over-the-counter tylenol in addition to the oxycodone for pain, discomfort, or fever. Oxycodone is VERY constipating.  Please take stool softener twice a day and laxatives daily until bowels are regular Eat a well-balanced diet.  Avoid lifting  or driving until you are instructed otherwise.  Make an appointment to see your caregiver for stitches (suture) or staple removal as directed.  If you have been sent home with a continuous passive motion machine (CPM machine), 0-90 degrees 6 hrs a day   2 hrs a shift SEEK MEDICAL CARE IF: You have swelling of your calf or leg.  You develop shortness of breath or chest pain.  You have redness, swelling, or increasing pain in the wound.  There is pus or any unusual drainage coming from the surgical site.  You notice a bad smell coming from the surgical site or dressing.  The surgical site breaks open after sutures or staples have been removed.  There is persistent bleeding from the suture or staple line.  You are getting worse or are not improving.  You have any other questions or concerns.  SEEK IMMEDIATE MEDICAL CARE IF:  You have a fever.  You develop a rash.  You have difficulty breathing.  You develop any reaction or side effects to medicines given.  Your knee motion is decreasing rather than improving.  MAKE SURE YOU:  Understand these instructions.  Will watch your condition.  Will get help right away if you are not  doing well or get worse.     Do not put a pillow under the knee. Place it under the heel.    Complete by:  As directed   Place gray foam block, curve side up under heel at all times except when in CPM or when walking.  DO NOT modify, tear, cut, or change in any way the gray foam block.     Increase activity slowly as tolerated    Complete by:  As directed      TED hose    Complete by:  As directed   Use stockings (TED hose) for 2 weeks on both leg(s).  You may remove them at night for sleeping.           Follow-up Information    Follow up with Lorn Junes, MD On 01/03/2015.   Specialty:  Orthopedic Surgery   Why:  appt at 2:15 pm in [facility]-Main information:   [place] [address] [phone]        Signed: Linda Hedges 12/21/2014, 8:50 AM

## 2014-12-21 NOTE — Progress Notes (Signed)
Physical Therapy Treatment Patient Details Name: Nancy Downs MRN: [id number] DOB: [birth year] Today's Date: 12/21/2014    History of Present Illness Patient is a 79 yo female admitted 12/20/14 now s/p Lt TKA.  PMH:  anemia, HLD, arthritis, back pain.    PT Comments    Pt is POD #1 and is moving very well.  We review the rest of her TK exercises this afternoon and walked again into the hallway.  Knee precautions reinforced and pt is ready to d/c home with daughter's assist.    Follow Up Recommendations  Home health PT;Supervision for mobility/OOB     Equipment Recommendations  Rolling walker with 5" wheels    Recommendations for Other Services   NA     Precautions / Restrictions Precautions Precautions: Knee Precaution Booklet Issued: Yes (comment) Precaution Comments: reviewed precautions and exercise handout (supine exercises this PM) Required Braces or Orthoses:  (d/c today) Restrictions LLE Weight Bearing: Weight bearing as tolerated    Mobility  Bed Mobility Overal bed mobility: Needs Assistance Bed Mobility: Supine to Sit;Sit to Supine     Supine to sit: Modified independent (Device/Increase time)     General bed mobility comments: Pt able to progress leg EOB without assistance.   Transfers Overall transfer level: Needs assistance Equipment used: Rolling walker (2 wheeled) Transfers: Sit to/from Stand Sit to Stand: Supervision         General transfer comment: supervision for safety during transitions  Ambulation/Gait Ambulation/Gait assistance: Supervision Ambulation Distance (Feet): 250 Feet Assistive device: Rolling walker (2 wheeled) Gait Pattern/deviations: Step-through pattern;Antalgic Gait velocity: decreased Gait velocity interpretation: Below normal speed for age/gender General Gait Details: mildly antalgic gait pattern, continues to do well without KI, at times verbal cues for safe RW use, and upright posture.          Balance Overall  balance assessment: Needs assistance Sitting-balance support: Feet supported;No upper extremity supported Sitting balance-Leahy Scale: Good     Standing balance support: No upper extremity supported;Bilateral upper extremity supported;Single extremity supported Standing balance-Leahy Scale: Fair                      Cognition Arousal/Alertness: Awake/alert Behavior During Therapy: WFL for tasks assessed/performed Overall Cognitive Status: Within Functional Limits for tasks assessed                      Exercises Total Joint Exercises Ankle Circles/Pumps: AROM;Both;10 reps;Supine Quad Sets: AROM;Left;10 reps;Supine Towel Squeeze: AROM;Both;10 reps;Supine Short Arc Quad: AROM;Left;10 reps;Supine Heel Slides: AAROM;Left;10 reps;Supine Hip ABduction/ADduction: AROM;Left;10 reps;Supine Straight Leg Raises: AROM;Left;10 reps;Supine        Pertinent Vitals/Pain Pain Assessment: 0-10 Pain Score: 5  Pain Location: left knee Pain Descriptors / Indicators: Aching;Burning Pain Intervention(s): Limited activity within patient's tolerance;Monitored during session;Repositioned;RN gave pain meds during session           PT Goals (current goals can now be found in the care plan section) Acute Rehab PT Goals Patient Stated Goal: To go home soon Progress towards PT goals: Progressing toward goals    Frequency  7X/week    PT Plan Current plan remains appropriate       End of Session   Activity Tolerance: Patient tolerated treatment well Patient left: Other (comment) (in bathroom with daughter)     Time: [phone] PT Time Calculation (min) (ACUTE ONLY): 37 min  Charges:  $Gait Training: 8-22 mins $Therapeutic Exercise: 8-22 mins  Barbarann Ehlers [place], [place], DPT [phone]   12/21/2014, 5:08 PM

## 2014-12-21 NOTE — Progress Notes (Signed)
Physical Therapy Treatment Patient Details Name: Nancy Downs MRN: [id number] DOB: 1936-10-16 Today's Date: 12/21/2014    History of Present Illness Patient is a 79 yo female admitted 12/20/14 now s/p Lt TKA.  PMH:  anemia, HLD, arthritis, back pain.    PT Comments    Pt is POD #1 and is progressing well with mobility.  We simulated high bed mobility using a stool and step for home entry this session.  Pt will need to walk again and we will finish reviewing HEP and knee precautions in PM session prior to d/c.  PT will continue to follow acutely.   Follow Up Recommendations  Home health PT;Supervision for mobility/OOB     Equipment Recommendations  Rolling walker with 5" wheels    Recommendations for Other Services   NA     Precautions / Restrictions Precautions Precautions: Knee Required Braces or Orthoses:  (d/c during therapy session today) Restrictions Weight Bearing Restrictions: Yes LLE Weight Bearing: Weight bearing as tolerated    Mobility  Bed Mobility Overal bed mobility: Modified Independent Bed Mobility: Sit to Supine;Supine to Sit     Supine to sit: Modified independent (Device/Increase time) Sit to supine: Modified independent (Device/Increase time)   General bed mobility comments: Simulated home bed set up with step and high bed.  HOB flat and no rails.  Pt was able to demonstrate with verbal cues for presumed easiest/safest technique to get into the bed and out of the bed using the step and RW for support.   Transfers Overall transfer level: Needs assistance Equipment used: Rolling walker (2 wheeled) Transfers: Sit to/from Stand Sit to Stand: Supervision         General transfer comment: Supervision for safety and verbal cues for safe hand placement and technique during transitions.  Pt was able to stand from high bed, lower recliner chair and low toliet with the assist of RW, rails in bathroom, and armrests on chair.    Ambulation/Gait Ambulation/Gait assistance: Supervision Ambulation Distance (Feet): 500 Feet Assistive device: Rolling walker (2 wheeled) Gait Pattern/deviations: Step-through pattern;Antalgic Gait velocity: decreased Gait velocity interpretation: Below normal speed for age/gender General Gait Details: Pt with mildly antalgic gait pattern.  No KI used this session as pt demonstrated good strength and stability at the left knee.  Pt with stiff, flexed knee gait pattern, verbal cues for upright posture and RW adjusted up for height.    Stairs Stairs: Yes Stairs assistance: Min assist Stair Management: No rails;Step to pattern;Forwards;With walker Number of Stairs: 1 General stair comments: Pt able to demonstrate the ability to do one curb step with RW with supervision.  Verbal cues needed for correct LE sequencing and safe RW use, min assist to help stabilize her as she stepped up with her stronger foot onto the step.              Cognition Arousal/Alertness: Awake/alert Behavior During Therapy: WFL for tasks assessed/performed Overall Cognitive Status: Within Functional Limits for tasks assessed                      Exercises Total Joint Exercises Long Arc Quad: AROM;Left;10 reps;Seated Knee Flexion: AROM;AAROM;Left;10 reps;Seated        Pertinent Vitals/Pain Pain Assessment: 0-10 Pain Score: 7  Pain Location: left knee Pain Descriptors / Indicators: Aching;Burning Pain Intervention(s): Limited activity within patient's tolerance;Monitored during session;Repositioned;Patient requesting pain meds-RN notified           PT Goals (current goals can now  be found in the care plan section) Acute Rehab PT Goals Patient Stated Goal: To go home soon Progress towards PT goals: Progressing toward goals    Frequency  7X/week    PT Plan Current plan remains appropriate       End of Session   Activity Tolerance: Patient limited by pain Patient left: in  chair;with call bell/phone within reach;with family/visitor present     Time: [id number] PT Time Calculation (min) (ACUTE ONLY): 54 min  Charges:  $Gait Training: 23-37 mins $Therapeutic Exercise: 8-22 mins $Therapeutic Activity: 8-22 mins                      Laporshia Hogen B. Cheryl Stabenow, PT, DPT [phone]   12/21/2014, 11:04 AM

## 2014-12-21 NOTE — Progress Notes (Signed)
PT Cancellation Note  Patient Details Name: Nancy Downs MRN: [id number] DOB: 1936/09/04   Cancelled Treatment:    Reason Eval/Treat Not Completed: Other (comment) (OT working with pt, PT to check back later as time allows. ) Thanks,   [name], PT, DPT [phone]   12/21/2014, 1:51 PM

## 2014-12-21 NOTE — Progress Notes (Signed)
CARE MANAGEMENT NOTE 12/21/2014  Patient:  Nancy Downs, Nancy Downs   Account Number:  [id number]  Date Initiated:  12/20/2014  Documentation initiated by:  [facility]  Subjective/Objective Assessment:   s/p left TKA     Action/Plan:   PT/OT evalsrecommended HHPT   Anticipated DC Date:  12/21/2014   Anticipated DC Plan:  [place]  CM consult      [facility] Choice  [place]   Choice offered to / List presented to:  C-1 Patient   DME arranged  [place]      DME agency  [employer]     [place] arranged  HH-2 PT      [place]   Status of service:  Completed, signed off Medicare Important Message given?   (If response is "NO", the following Medicare IM given date fields will be blank) Date Medicare IM given:   Medicare IM given by:   Date Additional Medicare IM given:   Additional Medicare IM given by:    Discharge Disposition:  [place]  Per UR Regulation:  Reviewed for med. necessity/level of care/duration of stay  If discussed at [place] of Stay Meetings, dates discussed:    Comments:  12/21/14 Spoke with patient, no change in d/c plan, HHPT with [facility] providing CPM and rolling walker, patient has 3N1. Fuller Plan RN, BSN, CCM  12/20/14 Set up with [facility] for HHPT by MD office.

## 2014-12-22 DIAGNOSIS — Z96652 Presence of left artificial knee joint: Secondary | ICD-10-CM | POA: Diagnosis not present

## 2014-12-22 DIAGNOSIS — Z471 Aftercare following joint replacement surgery: Secondary | ICD-10-CM | POA: Diagnosis not present

## 2014-12-22 DIAGNOSIS — C569 Malignant neoplasm of unspecified ovary: Secondary | ICD-10-CM | POA: Diagnosis not present

## 2014-12-22 DIAGNOSIS — Z87440 Personal history of urinary (tract) infections: Secondary | ICD-10-CM | POA: Diagnosis not present

## 2014-12-22 DIAGNOSIS — D649 Anemia, unspecified: Secondary | ICD-10-CM | POA: Diagnosis not present

## 2014-12-22 DIAGNOSIS — E785 Hyperlipidemia, unspecified: Secondary | ICD-10-CM | POA: Diagnosis not present

## 2014-12-24 DIAGNOSIS — E785 Hyperlipidemia, unspecified: Secondary | ICD-10-CM | POA: Diagnosis not present

## 2014-12-24 DIAGNOSIS — Z471 Aftercare following joint replacement surgery: Secondary | ICD-10-CM | POA: Diagnosis not present

## 2014-12-24 DIAGNOSIS — C569 Malignant neoplasm of unspecified ovary: Secondary | ICD-10-CM | POA: Diagnosis not present

## 2014-12-24 DIAGNOSIS — D649 Anemia, unspecified: Secondary | ICD-10-CM | POA: Diagnosis not present

## 2014-12-24 DIAGNOSIS — Z96652 Presence of left artificial knee joint: Secondary | ICD-10-CM | POA: Diagnosis not present

## 2014-12-24 DIAGNOSIS — Z87440 Personal history of urinary (tract) infections: Secondary | ICD-10-CM | POA: Diagnosis not present

## 2014-12-27 DIAGNOSIS — Z96652 Presence of left artificial knee joint: Secondary | ICD-10-CM | POA: Diagnosis not present

## 2014-12-27 DIAGNOSIS — E785 Hyperlipidemia, unspecified: Secondary | ICD-10-CM | POA: Diagnosis not present

## 2014-12-27 DIAGNOSIS — D649 Anemia, unspecified: Secondary | ICD-10-CM | POA: Diagnosis not present

## 2014-12-27 DIAGNOSIS — Z87440 Personal history of urinary (tract) infections: Secondary | ICD-10-CM | POA: Diagnosis not present

## 2014-12-27 DIAGNOSIS — C569 Malignant neoplasm of unspecified ovary: Secondary | ICD-10-CM | POA: Diagnosis not present

## 2014-12-27 DIAGNOSIS — Z471 Aftercare following joint replacement surgery: Secondary | ICD-10-CM | POA: Diagnosis not present

## 2014-12-29 DIAGNOSIS — Z96652 Presence of left artificial knee joint: Secondary | ICD-10-CM | POA: Diagnosis not present

## 2014-12-29 DIAGNOSIS — Z87440 Personal history of urinary (tract) infections: Secondary | ICD-10-CM | POA: Diagnosis not present

## 2014-12-29 DIAGNOSIS — C569 Malignant neoplasm of unspecified ovary: Secondary | ICD-10-CM | POA: Diagnosis not present

## 2014-12-29 DIAGNOSIS — D649 Anemia, unspecified: Secondary | ICD-10-CM | POA: Diagnosis not present

## 2014-12-29 DIAGNOSIS — Z471 Aftercare following joint replacement surgery: Secondary | ICD-10-CM | POA: Diagnosis not present

## 2014-12-29 DIAGNOSIS — E785 Hyperlipidemia, unspecified: Secondary | ICD-10-CM | POA: Diagnosis not present

## 2014-12-30 DIAGNOSIS — E785 Hyperlipidemia, unspecified: Secondary | ICD-10-CM | POA: Diagnosis not present

## 2014-12-30 DIAGNOSIS — Z471 Aftercare following joint replacement surgery: Secondary | ICD-10-CM | POA: Diagnosis not present

## 2014-12-30 DIAGNOSIS — D649 Anemia, unspecified: Secondary | ICD-10-CM | POA: Diagnosis not present

## 2014-12-30 DIAGNOSIS — C569 Malignant neoplasm of unspecified ovary: Secondary | ICD-10-CM | POA: Diagnosis not present

## 2014-12-30 DIAGNOSIS — Z87440 Personal history of urinary (tract) infections: Secondary | ICD-10-CM | POA: Diagnosis not present

## 2014-12-30 DIAGNOSIS — Z96652 Presence of left artificial knee joint: Secondary | ICD-10-CM | POA: Diagnosis not present

## 2015-01-03 DIAGNOSIS — D649 Anemia, unspecified: Secondary | ICD-10-CM | POA: Diagnosis not present

## 2015-01-03 DIAGNOSIS — Z87440 Personal history of urinary (tract) infections: Secondary | ICD-10-CM | POA: Diagnosis not present

## 2015-01-03 DIAGNOSIS — M1712 Unilateral primary osteoarthritis, left knee: Secondary | ICD-10-CM | POA: Diagnosis not present

## 2015-01-03 DIAGNOSIS — Z96652 Presence of left artificial knee joint: Secondary | ICD-10-CM | POA: Diagnosis not present

## 2015-01-03 DIAGNOSIS — C569 Malignant neoplasm of unspecified ovary: Secondary | ICD-10-CM | POA: Diagnosis not present

## 2015-01-03 DIAGNOSIS — Z471 Aftercare following joint replacement surgery: Secondary | ICD-10-CM | POA: Diagnosis not present

## 2015-01-03 DIAGNOSIS — E785 Hyperlipidemia, unspecified: Secondary | ICD-10-CM | POA: Diagnosis not present

## 2015-01-05 DIAGNOSIS — Z87440 Personal history of urinary (tract) infections: Secondary | ICD-10-CM | POA: Diagnosis not present

## 2015-01-05 DIAGNOSIS — Z96652 Presence of left artificial knee joint: Secondary | ICD-10-CM | POA: Diagnosis not present

## 2015-01-05 DIAGNOSIS — Z471 Aftercare following joint replacement surgery: Secondary | ICD-10-CM | POA: Diagnosis not present

## 2015-01-05 DIAGNOSIS — D649 Anemia, unspecified: Secondary | ICD-10-CM | POA: Diagnosis not present

## 2015-01-05 DIAGNOSIS — E785 Hyperlipidemia, unspecified: Secondary | ICD-10-CM | POA: Diagnosis not present

## 2015-01-05 DIAGNOSIS — C569 Malignant neoplasm of unspecified ovary: Secondary | ICD-10-CM | POA: Diagnosis not present

## 2015-01-07 DIAGNOSIS — Z87440 Personal history of urinary (tract) infections: Secondary | ICD-10-CM | POA: Diagnosis not present

## 2015-01-07 DIAGNOSIS — Z96652 Presence of left artificial knee joint: Secondary | ICD-10-CM | POA: Diagnosis not present

## 2015-01-07 DIAGNOSIS — D649 Anemia, unspecified: Secondary | ICD-10-CM | POA: Diagnosis not present

## 2015-01-07 DIAGNOSIS — E785 Hyperlipidemia, unspecified: Secondary | ICD-10-CM | POA: Diagnosis not present

## 2015-01-07 DIAGNOSIS — Z471 Aftercare following joint replacement surgery: Secondary | ICD-10-CM | POA: Diagnosis not present

## 2015-01-07 DIAGNOSIS — C569 Malignant neoplasm of unspecified ovary: Secondary | ICD-10-CM | POA: Diagnosis not present

## 2015-01-11 DIAGNOSIS — M6281 Muscle weakness (generalized): Secondary | ICD-10-CM | POA: Diagnosis not present

## 2015-01-11 DIAGNOSIS — Z471 Aftercare following joint replacement surgery: Secondary | ICD-10-CM | POA: Diagnosis not present

## 2015-01-11 DIAGNOSIS — M25662 Stiffness of left knee, not elsewhere classified: Secondary | ICD-10-CM | POA: Diagnosis not present

## 2015-01-11 DIAGNOSIS — R262 Difficulty in walking, not elsewhere classified: Secondary | ICD-10-CM | POA: Diagnosis not present

## 2015-01-13 DIAGNOSIS — R262 Difficulty in walking, not elsewhere classified: Secondary | ICD-10-CM | POA: Diagnosis not present

## 2015-01-13 DIAGNOSIS — Z471 Aftercare following joint replacement surgery: Secondary | ICD-10-CM | POA: Diagnosis not present

## 2015-01-13 DIAGNOSIS — M25662 Stiffness of left knee, not elsewhere classified: Secondary | ICD-10-CM | POA: Diagnosis not present

## 2015-01-13 DIAGNOSIS — M6281 Muscle weakness (generalized): Secondary | ICD-10-CM | POA: Diagnosis not present

## 2015-01-18 DIAGNOSIS — Z471 Aftercare following joint replacement surgery: Secondary | ICD-10-CM | POA: Diagnosis not present

## 2015-01-18 DIAGNOSIS — R262 Difficulty in walking, not elsewhere classified: Secondary | ICD-10-CM | POA: Diagnosis not present

## 2015-01-18 DIAGNOSIS — M6281 Muscle weakness (generalized): Secondary | ICD-10-CM | POA: Diagnosis not present

## 2015-01-18 DIAGNOSIS — M25662 Stiffness of left knee, not elsewhere classified: Secondary | ICD-10-CM | POA: Diagnosis not present

## 2015-01-25 DIAGNOSIS — M6281 Muscle weakness (generalized): Secondary | ICD-10-CM | POA: Diagnosis not present

## 2015-01-25 DIAGNOSIS — Z471 Aftercare following joint replacement surgery: Secondary | ICD-10-CM | POA: Diagnosis not present

## 2015-01-25 DIAGNOSIS — R262 Difficulty in walking, not elsewhere classified: Secondary | ICD-10-CM | POA: Diagnosis not present

## 2015-01-25 DIAGNOSIS — M25662 Stiffness of left knee, not elsewhere classified: Secondary | ICD-10-CM | POA: Diagnosis not present

## 2015-01-27 DIAGNOSIS — Z471 Aftercare following joint replacement surgery: Secondary | ICD-10-CM | POA: Diagnosis not present

## 2015-01-27 DIAGNOSIS — M6281 Muscle weakness (generalized): Secondary | ICD-10-CM | POA: Diagnosis not present

## 2015-01-27 DIAGNOSIS — M25662 Stiffness of left knee, not elsewhere classified: Secondary | ICD-10-CM | POA: Diagnosis not present

## 2015-01-27 DIAGNOSIS — R262 Difficulty in walking, not elsewhere classified: Secondary | ICD-10-CM | POA: Diagnosis not present

## 2015-02-01 DIAGNOSIS — M6281 Muscle weakness (generalized): Secondary | ICD-10-CM | POA: Diagnosis not present

## 2015-02-01 DIAGNOSIS — R262 Difficulty in walking, not elsewhere classified: Secondary | ICD-10-CM | POA: Diagnosis not present

## 2015-02-01 DIAGNOSIS — Z471 Aftercare following joint replacement surgery: Secondary | ICD-10-CM | POA: Diagnosis not present

## 2015-02-01 DIAGNOSIS — M25662 Stiffness of left knee, not elsewhere classified: Secondary | ICD-10-CM | POA: Diagnosis not present

## 2015-02-02 DIAGNOSIS — Z96652 Presence of left artificial knee joint: Secondary | ICD-10-CM | POA: Diagnosis not present

## 2015-02-04 ENCOUNTER — Ambulatory Visit: Payer: Self-pay | Admitting: Family Medicine

## 2015-02-04 DIAGNOSIS — R634 Abnormal weight loss: Secondary | ICD-10-CM | POA: Diagnosis not present

## 2015-02-04 DIAGNOSIS — C569 Malignant neoplasm of unspecified ovary: Secondary | ICD-10-CM | POA: Diagnosis not present

## 2015-02-04 DIAGNOSIS — R110 Nausea: Secondary | ICD-10-CM | POA: Diagnosis not present

## 2015-02-04 DIAGNOSIS — M47816 Spondylosis without myelopathy or radiculopathy, lumbar region: Secondary | ICD-10-CM | POA: Diagnosis not present

## 2015-02-04 DIAGNOSIS — M419 Scoliosis, unspecified: Secondary | ICD-10-CM | POA: Diagnosis not present

## 2015-02-04 DIAGNOSIS — M545 Low back pain: Secondary | ICD-10-CM | POA: Diagnosis not present

## 2015-02-04 DIAGNOSIS — M5136 Other intervertebral disc degeneration, lumbar region: Secondary | ICD-10-CM | POA: Diagnosis not present

## 2015-02-04 DIAGNOSIS — G5690 Unspecified mononeuropathy of unspecified upper limb: Secondary | ICD-10-CM | POA: Diagnosis not present

## 2015-02-04 LAB — BASIC METABOLIC PANEL
CREATININE: 1.1 mg/dL (ref 0.5–1.1)
GLUCOSE: 118 mg/dL

## 2015-02-04 LAB — TSH: TSH: 2.57 u[IU]/mL (ref 0.41–5.90)

## 2015-02-07 DIAGNOSIS — M6281 Muscle weakness (generalized): Secondary | ICD-10-CM | POA: Diagnosis not present

## 2015-02-07 DIAGNOSIS — R262 Difficulty in walking, not elsewhere classified: Secondary | ICD-10-CM | POA: Diagnosis not present

## 2015-02-07 DIAGNOSIS — M25662 Stiffness of left knee, not elsewhere classified: Secondary | ICD-10-CM | POA: Diagnosis not present

## 2015-02-07 DIAGNOSIS — Z471 Aftercare following joint replacement surgery: Secondary | ICD-10-CM | POA: Diagnosis not present

## 2015-02-10 DIAGNOSIS — R262 Difficulty in walking, not elsewhere classified: Secondary | ICD-10-CM | POA: Diagnosis not present

## 2015-02-10 DIAGNOSIS — M25662 Stiffness of left knee, not elsewhere classified: Secondary | ICD-10-CM | POA: Diagnosis not present

## 2015-02-10 DIAGNOSIS — M6281 Muscle weakness (generalized): Secondary | ICD-10-CM | POA: Diagnosis not present

## 2015-02-10 DIAGNOSIS — Z471 Aftercare following joint replacement surgery: Secondary | ICD-10-CM | POA: Diagnosis not present

## 2015-02-15 DIAGNOSIS — M25662 Stiffness of left knee, not elsewhere classified: Secondary | ICD-10-CM | POA: Diagnosis not present

## 2015-02-15 DIAGNOSIS — M6281 Muscle weakness (generalized): Secondary | ICD-10-CM | POA: Diagnosis not present

## 2015-02-15 DIAGNOSIS — R262 Difficulty in walking, not elsewhere classified: Secondary | ICD-10-CM | POA: Diagnosis not present

## 2015-02-15 DIAGNOSIS — Z96612 Presence of left artificial shoulder joint: Secondary | ICD-10-CM | POA: Diagnosis not present

## 2015-02-17 DIAGNOSIS — M25662 Stiffness of left knee, not elsewhere classified: Secondary | ICD-10-CM | POA: Diagnosis not present

## 2015-02-17 DIAGNOSIS — M6281 Muscle weakness (generalized): Secondary | ICD-10-CM | POA: Diagnosis not present

## 2015-02-17 DIAGNOSIS — Z471 Aftercare following joint replacement surgery: Secondary | ICD-10-CM | POA: Diagnosis not present

## 2015-02-17 DIAGNOSIS — R262 Difficulty in walking, not elsewhere classified: Secondary | ICD-10-CM | POA: Diagnosis not present

## 2015-02-22 DIAGNOSIS — M461 Sacroiliitis, not elsewhere classified: Secondary | ICD-10-CM | POA: Diagnosis not present

## 2015-02-22 DIAGNOSIS — M47817 Spondylosis without myelopathy or radiculopathy, lumbosacral region: Secondary | ICD-10-CM | POA: Diagnosis not present

## 2015-02-24 DIAGNOSIS — M25662 Stiffness of left knee, not elsewhere classified: Secondary | ICD-10-CM | POA: Diagnosis not present

## 2015-02-24 DIAGNOSIS — Z471 Aftercare following joint replacement surgery: Secondary | ICD-10-CM | POA: Diagnosis not present

## 2015-02-24 DIAGNOSIS — R262 Difficulty in walking, not elsewhere classified: Secondary | ICD-10-CM | POA: Diagnosis not present

## 2015-02-24 DIAGNOSIS — M6281 Muscle weakness (generalized): Secondary | ICD-10-CM | POA: Diagnosis not present

## 2015-02-25 DIAGNOSIS — M545 Low back pain: Secondary | ICD-10-CM | POA: Diagnosis not present

## 2015-02-28 DIAGNOSIS — R262 Difficulty in walking, not elsewhere classified: Secondary | ICD-10-CM | POA: Diagnosis not present

## 2015-02-28 DIAGNOSIS — M25662 Stiffness of left knee, not elsewhere classified: Secondary | ICD-10-CM | POA: Diagnosis not present

## 2015-02-28 DIAGNOSIS — Z471 Aftercare following joint replacement surgery: Secondary | ICD-10-CM | POA: Diagnosis not present

## 2015-02-28 DIAGNOSIS — M6281 Muscle weakness (generalized): Secondary | ICD-10-CM | POA: Diagnosis not present

## 2015-03-02 DIAGNOSIS — Z96652 Presence of left artificial knee joint: Secondary | ICD-10-CM | POA: Diagnosis not present

## 2015-03-02 DIAGNOSIS — R110 Nausea: Secondary | ICD-10-CM | POA: Diagnosis not present

## 2015-03-02 DIAGNOSIS — M549 Dorsalgia, unspecified: Secondary | ICD-10-CM | POA: Diagnosis not present

## 2015-03-02 DIAGNOSIS — R634 Abnormal weight loss: Secondary | ICD-10-CM | POA: Diagnosis not present

## 2015-03-02 DIAGNOSIS — Z13820 Encounter for screening for osteoporosis: Secondary | ICD-10-CM | POA: Diagnosis not present

## 2015-03-02 DIAGNOSIS — Z23 Encounter for immunization: Secondary | ICD-10-CM | POA: Diagnosis not present

## 2015-03-07 DIAGNOSIS — M6281 Muscle weakness (generalized): Secondary | ICD-10-CM | POA: Diagnosis not present

## 2015-03-07 DIAGNOSIS — Z471 Aftercare following joint replacement surgery: Secondary | ICD-10-CM | POA: Diagnosis not present

## 2015-03-07 DIAGNOSIS — R262 Difficulty in walking, not elsewhere classified: Secondary | ICD-10-CM | POA: Diagnosis not present

## 2015-03-07 DIAGNOSIS — M25662 Stiffness of left knee, not elsewhere classified: Secondary | ICD-10-CM | POA: Diagnosis not present

## 2015-03-16 ENCOUNTER — Ambulatory Visit
Admit: 2015-03-16 | Disposition: A | Discharge status: Home / Self Care | Payer: Self-pay | Attending: Family Medicine | Admitting: Family Medicine

## 2015-03-16 DIAGNOSIS — M8580 Other specified disorders of bone density and structure, unspecified site: Secondary | ICD-10-CM | POA: Diagnosis not present

## 2015-03-16 DIAGNOSIS — M179 Osteoarthritis of knee, unspecified: Secondary | ICD-10-CM | POA: Insufficient documentation

## 2015-03-16 DIAGNOSIS — Z13820 Encounter for screening for osteoporosis: Secondary | ICD-10-CM | POA: Diagnosis not present

## 2015-03-16 DIAGNOSIS — M1710 Unilateral primary osteoarthritis, unspecified knee: Secondary | ICD-10-CM | POA: Insufficient documentation

## 2015-03-16 DIAGNOSIS — M8589 Other specified disorders of bone density and structure, multiple sites: Secondary | ICD-10-CM | POA: Diagnosis not present

## 2015-03-16 LAB — HM DEXA SCAN

## 2015-03-21 DIAGNOSIS — Z803 Family history of malignant neoplasm of breast: Secondary | ICD-10-CM | POA: Diagnosis not present

## 2015-03-21 DIAGNOSIS — G4700 Insomnia, unspecified: Secondary | ICD-10-CM | POA: Diagnosis not present

## 2015-03-21 DIAGNOSIS — Z6829 Body mass index (BMI) 29.0-29.9, adult: Secondary | ICD-10-CM | POA: Diagnosis not present

## 2015-03-21 DIAGNOSIS — R0602 Shortness of breath: Secondary | ICD-10-CM | POA: Diagnosis not present

## 2015-03-21 DIAGNOSIS — Z79899 Other long term (current) drug therapy: Secondary | ICD-10-CM | POA: Diagnosis not present

## 2015-03-21 DIAGNOSIS — Z96651 Presence of right artificial knee joint: Secondary | ICD-10-CM | POA: Diagnosis not present

## 2015-03-21 DIAGNOSIS — Z8543 Personal history of malignant neoplasm of ovary: Secondary | ICD-10-CM | POA: Diagnosis not present

## 2015-03-21 DIAGNOSIS — C569 Malignant neoplasm of unspecified ovary: Secondary | ICD-10-CM | POA: Diagnosis not present

## 2015-03-21 DIAGNOSIS — N281 Cyst of kidney, acquired: Secondary | ICD-10-CM | POA: Diagnosis not present

## 2015-03-21 DIAGNOSIS — R110 Nausea: Secondary | ICD-10-CM | POA: Diagnosis not present

## 2015-03-21 DIAGNOSIS — R079 Chest pain, unspecified: Secondary | ICD-10-CM | POA: Diagnosis not present

## 2015-03-21 DIAGNOSIS — R932 Abnormal findings on diagnostic imaging of liver and biliary tract: Secondary | ICD-10-CM | POA: Diagnosis not present

## 2015-03-21 DIAGNOSIS — M549 Dorsalgia, unspecified: Secondary | ICD-10-CM | POA: Diagnosis not present

## 2015-03-29 DIAGNOSIS — Z85828 Personal history of other malignant neoplasm of skin: Secondary | ICD-10-CM | POA: Diagnosis not present

## 2015-03-29 DIAGNOSIS — L400 Psoriasis vulgaris: Secondary | ICD-10-CM | POA: Diagnosis not present

## 2015-03-29 DIAGNOSIS — L820 Inflamed seborrheic keratosis: Secondary | ICD-10-CM | POA: Diagnosis not present

## 2015-03-29 DIAGNOSIS — L280 Lichen simplex chronicus: Secondary | ICD-10-CM | POA: Diagnosis not present

## 2015-04-21 DIAGNOSIS — M25512 Pain in left shoulder: Secondary | ICD-10-CM | POA: Diagnosis not present

## 2015-04-21 DIAGNOSIS — M542 Cervicalgia: Secondary | ICD-10-CM | POA: Diagnosis not present

## 2015-04-21 DIAGNOSIS — M25511 Pain in right shoulder: Secondary | ICD-10-CM | POA: Diagnosis not present

## 2015-05-10 DIAGNOSIS — C569 Malignant neoplasm of unspecified ovary: Secondary | ICD-10-CM | POA: Diagnosis not present

## 2015-05-10 DIAGNOSIS — J90 Pleural effusion, not elsewhere classified: Secondary | ICD-10-CM | POA: Diagnosis not present

## 2015-05-10 DIAGNOSIS — R928 Other abnormal and inconclusive findings on diagnostic imaging of breast: Secondary | ICD-10-CM | POA: Diagnosis not present

## 2015-05-17 DIAGNOSIS — Z7982 Long term (current) use of aspirin: Secondary | ICD-10-CM | POA: Diagnosis not present

## 2015-05-17 DIAGNOSIS — R928 Other abnormal and inconclusive findings on diagnostic imaging of breast: Secondary | ICD-10-CM | POA: Diagnosis not present

## 2015-05-17 DIAGNOSIS — Z809 Family history of malignant neoplasm, unspecified: Secondary | ICD-10-CM | POA: Diagnosis not present

## 2015-05-17 DIAGNOSIS — N6011 Diffuse cystic mastopathy of right breast: Secondary | ICD-10-CM | POA: Diagnosis not present

## 2015-05-17 DIAGNOSIS — Z1501 Genetic susceptibility to malignant neoplasm of breast: Secondary | ICD-10-CM | POA: Diagnosis not present

## 2015-05-17 DIAGNOSIS — Z79899 Other long term (current) drug therapy: Secondary | ICD-10-CM | POA: Diagnosis not present

## 2015-05-17 DIAGNOSIS — Z791 Long term (current) use of non-steroidal anti-inflammatories (NSAID): Secondary | ICD-10-CM | POA: Diagnosis not present

## 2015-05-17 DIAGNOSIS — Z1502 Genetic susceptibility to malignant neoplasm of ovary: Secondary | ICD-10-CM | POA: Diagnosis not present

## 2015-05-17 DIAGNOSIS — Z79891 Long term (current) use of opiate analgesic: Secondary | ICD-10-CM | POA: Diagnosis not present

## 2015-05-17 DIAGNOSIS — N63 Unspecified lump in breast: Secondary | ICD-10-CM | POA: Diagnosis not present

## 2015-05-17 DIAGNOSIS — C569 Malignant neoplasm of unspecified ovary: Secondary | ICD-10-CM | POA: Diagnosis not present

## 2015-06-22 DIAGNOSIS — D1803 Hemangioma of intra-abdominal structures: Secondary | ICD-10-CM | POA: Diagnosis not present

## 2015-06-22 DIAGNOSIS — C569 Malignant neoplasm of unspecified ovary: Secondary | ICD-10-CM | POA: Diagnosis not present

## 2015-06-22 DIAGNOSIS — N281 Cyst of kidney, acquired: Secondary | ICD-10-CM | POA: Diagnosis not present

## 2015-06-22 DIAGNOSIS — K769 Liver disease, unspecified: Secondary | ICD-10-CM | POA: Diagnosis not present

## 2015-06-22 DIAGNOSIS — R160 Hepatomegaly, not elsewhere classified: Secondary | ICD-10-CM | POA: Diagnosis not present

## 2015-06-22 DIAGNOSIS — C799 Secondary malignant neoplasm of unspecified site: Secondary | ICD-10-CM | POA: Diagnosis not present

## 2015-07-27 ENCOUNTER — Other Ambulatory Visit: Payer: Self-pay | Admitting: Family Medicine

## 2015-07-27 MED ORDER — ZOLPIDEM TARTRATE 10 MG PO TABS: 10.0000 mg | ORAL_TABLET | Freq: Every evening | ORAL | Status: DC | PRN

## 2015-07-27 NOTE — Telephone Encounter (Signed)
This is a Dr. Venia Minks pt and she is requesting a  Refill for Ambien 10 mg to be sent to [facility] in [place]. Pt stated that she has refills left but [facility] told her the RX was only good for 6 months and it ran out a few days ago. Thanks TNP

## 2015-07-27 NOTE — Telephone Encounter (Signed)
Please call in zoldipem

## 2015-07-28 ENCOUNTER — Telehealth: Payer: Self-pay

## 2015-07-28 NOTE — Telephone Encounter (Signed)
Rx called in to pharmacy. 

## 2015-07-28 NOTE — Telephone Encounter (Signed)
Fax from [facility] request for Zolpidem 10 mg 1 qhs prn-this is Dr. Sharyon Medicus patient. LOV 03/02/15. Please review=aa

## 2015-07-29 NOTE — Telephone Encounter (Signed)
Okay to refill for 2 months.

## 2015-08-01 DIAGNOSIS — Z90710 Acquired absence of both cervix and uterus: Secondary | ICD-10-CM | POA: Diagnosis not present

## 2015-08-01 DIAGNOSIS — Z6829 Body mass index (BMI) 29.0-29.9, adult: Secondary | ICD-10-CM | POA: Diagnosis not present

## 2015-08-01 DIAGNOSIS — C569 Malignant neoplasm of unspecified ovary: Secondary | ICD-10-CM | POA: Diagnosis not present

## 2015-08-01 DIAGNOSIS — Z8543 Personal history of malignant neoplasm of ovary: Secondary | ICD-10-CM | POA: Diagnosis not present

## 2015-08-01 DIAGNOSIS — Z9221 Personal history of antineoplastic chemotherapy: Secondary | ICD-10-CM | POA: Diagnosis not present

## 2015-08-01 DIAGNOSIS — Z7982 Long term (current) use of aspirin: Secondary | ICD-10-CM | POA: Diagnosis not present

## 2015-08-01 DIAGNOSIS — Z79899 Other long term (current) drug therapy: Secondary | ICD-10-CM | POA: Diagnosis not present

## 2015-08-01 DIAGNOSIS — Z08 Encounter for follow-up examination after completed treatment for malignant neoplasm: Secondary | ICD-10-CM | POA: Diagnosis not present

## 2015-08-01 DIAGNOSIS — M549 Dorsalgia, unspecified: Secondary | ICD-10-CM | POA: Diagnosis not present

## 2015-08-01 DIAGNOSIS — Z8541 Personal history of malignant neoplasm of cervix uteri: Secondary | ICD-10-CM | POA: Diagnosis not present

## 2015-08-01 DIAGNOSIS — Z96652 Presence of left artificial knee joint: Secondary | ICD-10-CM | POA: Diagnosis not present

## 2015-08-01 DIAGNOSIS — Z9889 Other specified postprocedural states: Secondary | ICD-10-CM | POA: Diagnosis not present

## 2015-08-21 ENCOUNTER — Other Ambulatory Visit: Payer: Self-pay | Admitting: Family Medicine

## 2015-08-21 DIAGNOSIS — E785 Hyperlipidemia, unspecified: Secondary | ICD-10-CM

## 2015-08-22 NOTE — Telephone Encounter (Signed)
Last ov 02/2015  Thanks,   -Mickel Baas

## 2015-09-28 DIAGNOSIS — M25551 Pain in right hip: Secondary | ICD-10-CM | POA: Diagnosis not present

## 2015-09-28 DIAGNOSIS — M545 Low back pain: Secondary | ICD-10-CM | POA: Diagnosis not present

## 2015-10-06 ENCOUNTER — Telehealth: Payer: Self-pay

## 2015-10-06 DIAGNOSIS — E785 Hyperlipidemia, unspecified: Secondary | ICD-10-CM

## 2015-10-06 DIAGNOSIS — D649 Anemia, unspecified: Secondary | ICD-10-CM

## 2015-10-06 NOTE — Telephone Encounter (Signed)
Need CBC, met C and lipid. Last CBC was low. Thanks.

## 2015-10-06 NOTE — Telephone Encounter (Signed)
Pt states that she stopped taking Lovastatin about 3 weeks ago due to pain in her arms, pain got better until yesterday but she is not sure if this is related. Patient wants to get lab slip to get this checked off the medication and anything else she needs to have checked.

## 2015-10-06 NOTE — Telephone Encounter (Signed)
Lab sheet at the front desk; pt advised.   Thanks,   -Tran Arzuaga  

## 2015-10-07 DIAGNOSIS — E785 Hyperlipidemia, unspecified: Secondary | ICD-10-CM | POA: Diagnosis not present

## 2015-10-07 DIAGNOSIS — D649 Anemia, unspecified: Secondary | ICD-10-CM | POA: Diagnosis not present

## 2015-10-08 LAB — CBC WITH DIFFERENTIAL/PLATELET
BASOS: 0 %
Basophils Absolute: 0 10*3/uL (ref 0.0–0.2)
EOS (ABSOLUTE): 0.1 10*3/uL (ref 0.0–0.4)
EOS: 2 %
HEMATOCRIT: 36.8 % (ref 34.0–46.6)
Hemoglobin: 12.3 g/dL (ref 11.1–15.9)
IMMATURE GRANS (ABS): 0 10*3/uL (ref 0.0–0.1)
IMMATURE GRANULOCYTES: 0 %
Lymphocytes Absolute: 1.8 10*3/uL (ref 0.7–3.1)
Lymphs: 25 %
MCH: 30.2 pg (ref 26.6–33.0)
MCHC: 33.4 g/dL (ref 31.5–35.7)
MCV: 90 fL (ref 79–97)
Monocytes Absolute: 0.4 10*3/uL (ref 0.1–0.9)
Monocytes: 6 %
NEUTROS ABS: 4.8 10*3/uL (ref 1.4–7.0)
NEUTROS PCT: 67 %
Platelets: 262 10*3/uL (ref 150–379)
RBC: 4.07 x10E6/uL (ref 3.77–5.28)
RDW: 13.1 % (ref 12.3–15.4)
WBC: 7.1 10*3/uL (ref 3.4–10.8)

## 2015-10-08 LAB — LIPID PANEL
CHOLESTEROL TOTAL: 216 mg/dL — AB (ref 100–199)
Chol/HDL Ratio: 2.5 ratio units (ref 0.0–4.4)
HDL: 87 mg/dL (ref >39)
LDL CALC: 103 mg/dL — AB (ref 0–99)
TRIGLYCERIDES: 130 mg/dL (ref 0–149)
VLDL Cholesterol Cal: 26 mg/dL (ref 5–40)

## 2015-10-08 LAB — COMPREHENSIVE METABOLIC PANEL
A/G RATIO: 1.6 (ref 1.1–2.5)
ALT: 11 IU/L (ref 0–32)
AST: 13 IU/L (ref 0–40)
Albumin: 4.3 g/dL (ref 3.5–4.8)
Alkaline Phosphatase: 83 IU/L (ref 39–117)
BUN/Creatinine Ratio: 25 (ref 11–26)
BUN: 19 mg/dL (ref 8–27)
Bilirubin Total: 0.4 mg/dL (ref 0.0–1.2)
CHLORIDE: 100 mmol/L (ref 97–106)
CO2: 27 mmol/L (ref 18–29)
Calcium: 9.4 mg/dL (ref 8.7–10.3)
Creatinine, Ser: 0.75 mg/dL (ref 0.57–1.00)
GFR calc Af Amer: 89 mL/min/{1.73_m2} (ref >59)
GFR calc non Af Amer: 77 mL/min/{1.73_m2} (ref >59)
Globulin, Total: 2.7 g/dL (ref 1.5–4.5)
Glucose: 100 mg/dL — ABNORMAL HIGH (ref 65–99)
Potassium: 4.7 mmol/L (ref 3.5–5.2)
Sodium: 141 mmol/L (ref 136–144)
TOTAL PROTEIN: 7 g/dL (ref 6.0–8.5)

## 2015-10-10 ENCOUNTER — Telehealth: Payer: Self-pay

## 2015-10-10 MED ORDER — LOVASTATIN 10 MG PO TABS: 10.0000 mg | ORAL_TABLET | Freq: Every day | ORAL | Status: DC

## 2015-10-10 NOTE — Telephone Encounter (Signed)
-----   Message from Margarita Rana, MD sent at 10/08/2015 10:11 AM EDT ----- CBC normal. Cholesterol is slightly elevated but risk of heart disease is elevated at 18 percent still. Please see if want to try another medication and make sure taking a baby aspirin daily. Thanks.

## 2015-10-10 NOTE — Telephone Encounter (Signed)
Pt is taking 81mg  ASA qd. Pt agrees to start cholesterol med, but is requesting a low dose due to myalgias. Pt has Lovastatin 20 mg at home, and is asking if she can take 1/2 tab qd? Renaldo Fiddler, CMA

## 2015-10-24 DIAGNOSIS — C569 Malignant neoplasm of unspecified ovary: Secondary | ICD-10-CM | POA: Diagnosis not present

## 2015-10-24 DIAGNOSIS — R932 Abnormal findings on diagnostic imaging of liver and biliary tract: Secondary | ICD-10-CM | POA: Diagnosis not present

## 2015-10-24 DIAGNOSIS — C787 Secondary malignant neoplasm of liver and intrahepatic bile duct: Secondary | ICD-10-CM | POA: Diagnosis not present

## 2015-10-24 DIAGNOSIS — D1803 Hemangioma of intra-abdominal structures: Secondary | ICD-10-CM | POA: Diagnosis not present

## 2015-11-14 DIAGNOSIS — Z6829 Body mass index (BMI) 29.0-29.9, adult: Secondary | ICD-10-CM | POA: Diagnosis not present

## 2015-11-14 DIAGNOSIS — C787 Secondary malignant neoplasm of liver and intrahepatic bile duct: Secondary | ICD-10-CM | POA: Diagnosis not present

## 2015-11-14 DIAGNOSIS — C569 Malignant neoplasm of unspecified ovary: Secondary | ICD-10-CM | POA: Diagnosis not present

## 2015-11-14 DIAGNOSIS — Z79899 Other long term (current) drug therapy: Secondary | ICD-10-CM | POA: Diagnosis not present

## 2015-11-22 ENCOUNTER — Other Ambulatory Visit: Payer: Self-pay | Admitting: Family Medicine

## 2015-11-22 DIAGNOSIS — F419 Anxiety disorder, unspecified: Secondary | ICD-10-CM

## 2015-11-23 DIAGNOSIS — Z9049 Acquired absence of other specified parts of digestive tract: Secondary | ICD-10-CM | POA: Insufficient documentation

## 2015-11-23 DIAGNOSIS — C569 Malignant neoplasm of unspecified ovary: Secondary | ICD-10-CM | POA: Insufficient documentation

## 2015-11-23 NOTE — Telephone Encounter (Signed)
Last OV 04/2015  Thanks,   -Laura  

## 2015-12-07 ENCOUNTER — Other Ambulatory Visit: Payer: Self-pay

## 2015-12-07 DIAGNOSIS — J309 Allergic rhinitis, unspecified: Secondary | ICD-10-CM | POA: Insufficient documentation

## 2015-12-07 DIAGNOSIS — M25569 Pain in unspecified knee: Secondary | ICD-10-CM | POA: Insufficient documentation

## 2015-12-07 DIAGNOSIS — R739 Hyperglycemia, unspecified: Secondary | ICD-10-CM | POA: Insufficient documentation

## 2015-12-07 DIAGNOSIS — M545 Low back pain, unspecified: Secondary | ICD-10-CM | POA: Insufficient documentation

## 2015-12-07 DIAGNOSIS — C799 Secondary malignant neoplasm of unspecified site: Secondary | ICD-10-CM | POA: Insufficient documentation

## 2015-12-07 DIAGNOSIS — E559 Vitamin D deficiency, unspecified: Secondary | ICD-10-CM

## 2015-12-07 DIAGNOSIS — R220 Localized swelling, mass and lump, head: Secondary | ICD-10-CM | POA: Insufficient documentation

## 2015-12-07 DIAGNOSIS — IMO0002 Reserved for concepts with insufficient information to code with codable children: Secondary | ICD-10-CM | POA: Insufficient documentation

## 2015-12-07 DIAGNOSIS — C787 Secondary malignant neoplasm of liver and intrahepatic bile duct: Secondary | ICD-10-CM | POA: Insufficient documentation

## 2015-12-07 DIAGNOSIS — G4700 Insomnia, unspecified: Secondary | ICD-10-CM | POA: Insufficient documentation

## 2015-12-07 DIAGNOSIS — K469 Unspecified abdominal hernia without obstruction or gangrene: Secondary | ICD-10-CM | POA: Insufficient documentation

## 2015-12-07 DIAGNOSIS — K219 Gastro-esophageal reflux disease without esophagitis: Secondary | ICD-10-CM | POA: Insufficient documentation

## 2015-12-07 DIAGNOSIS — B029 Zoster without complications: Secondary | ICD-10-CM | POA: Insufficient documentation

## 2015-12-07 DIAGNOSIS — Z90710 Acquired absence of both cervix and uterus: Secondary | ICD-10-CM | POA: Insufficient documentation

## 2015-12-07 DIAGNOSIS — F329 Major depressive disorder, single episode, unspecified: Secondary | ICD-10-CM | POA: Insufficient documentation

## 2015-12-07 DIAGNOSIS — F32A Depression, unspecified: Secondary | ICD-10-CM | POA: Insufficient documentation

## 2015-12-07 MED ORDER — ZOLPIDEM TARTRATE 10 MG PO TABS
10.0000 mg | ORAL_TABLET | Freq: Every evening | ORAL | Status: DC | PRN
Start: 2015-12-07 — End: 2015-12-13

## 2015-12-07 MED ORDER — ERGOCALCIFEROL 1.25 MG (50000 UT) PO CAPS
50000.0000 [IU] | ORAL_CAPSULE | ORAL | Status: DC
Start: 2015-12-07 — End: 2016-08-09

## 2015-12-07 NOTE — Telephone Encounter (Signed)
Pharmacy requesting refill. sd 

## 2015-12-07 NOTE — Telephone Encounter (Signed)
Last ov 03/08/15

## 2015-12-07 NOTE — Telephone Encounter (Signed)
rx called into [facility]. sd

## 2015-12-07 NOTE — Telephone Encounter (Signed)
Printed, please fax or call in to pharmacy. Thank you.   

## 2015-12-13 ENCOUNTER — Other Ambulatory Visit: Payer: Self-pay

## 2015-12-13 DIAGNOSIS — G4700 Insomnia, unspecified: Secondary | ICD-10-CM

## 2015-12-13 MED ORDER — ZOLPIDEM TARTRATE 10 MG PO TABS
10.0000 mg | ORAL_TABLET | Freq: Every evening | ORAL | Status: DC | PRN
Start: 2015-12-13 — End: 2016-04-11

## 2015-12-28 DIAGNOSIS — R928 Other abnormal and inconclusive findings on diagnostic imaging of breast: Secondary | ICD-10-CM | POA: Diagnosis not present

## 2015-12-28 DIAGNOSIS — Z148 Genetic carrier of other disease: Secondary | ICD-10-CM | POA: Diagnosis not present

## 2016-01-03 DIAGNOSIS — Z6829 Body mass index (BMI) 29.0-29.9, adult: Secondary | ICD-10-CM | POA: Diagnosis not present

## 2016-01-03 DIAGNOSIS — Z1231 Encounter for screening mammogram for malignant neoplasm of breast: Secondary | ICD-10-CM | POA: Diagnosis not present

## 2016-02-09 ENCOUNTER — Other Ambulatory Visit: Payer: Self-pay | Admitting: Family Medicine

## 2016-02-09 DIAGNOSIS — J309 Allergic rhinitis, unspecified: Secondary | ICD-10-CM

## 2016-02-29 ENCOUNTER — Ambulatory Visit
Admission: RE | Admit: 2016-02-29 | Discharge: 2016-02-29 | Disposition: A | Discharge status: Home / Self Care | Payer: Medicare Other | Source: Ambulatory Visit | Attending: Orthopedic Surgery | Admitting: Orthopedic Surgery

## 2016-02-29 ENCOUNTER — Other Ambulatory Visit: Payer: Self-pay | Admitting: Orthopedic Surgery

## 2016-02-29 DIAGNOSIS — M25561 Pain in right knee: Secondary | ICD-10-CM | POA: Diagnosis not present

## 2016-02-29 DIAGNOSIS — M25562 Pain in left knee: Principal | ICD-10-CM

## 2016-02-29 DIAGNOSIS — Z96652 Presence of left artificial knee joint: Secondary | ICD-10-CM | POA: Diagnosis not present

## 2016-02-29 DIAGNOSIS — M1711 Unilateral primary osteoarthritis, right knee: Secondary | ICD-10-CM | POA: Insufficient documentation

## 2016-02-29 DIAGNOSIS — M179 Osteoarthritis of knee, unspecified: Secondary | ICD-10-CM | POA: Diagnosis not present

## 2016-02-29 DIAGNOSIS — Z471 Aftercare following joint replacement surgery: Secondary | ICD-10-CM | POA: Diagnosis not present

## 2016-03-21 DIAGNOSIS — M545 Low back pain: Secondary | ICD-10-CM | POA: Diagnosis not present

## 2016-04-11 ENCOUNTER — Ambulatory Visit (INDEPENDENT_AMBULATORY_CARE_PROVIDER_SITE_OTHER): Payer: Medicare Other | Admitting: Family Medicine

## 2016-04-11 ENCOUNTER — Encounter: Payer: Self-pay | Admitting: Family Medicine

## 2016-04-11 ENCOUNTER — Ambulatory Visit
Admission: RE | Admit: 2016-04-11 | Discharge: 2016-04-11 | Disposition: A | Discharge status: Home / Self Care | Payer: Medicare Other | Source: Ambulatory Visit | Attending: Family Medicine | Admitting: Family Medicine

## 2016-04-11 VITALS — BP 130/68 | HR 80 | Temp 98.0°F | Resp 20 | Wt 191.0 lb

## 2016-04-11 DIAGNOSIS — L03115 Cellulitis of right lower limb: Secondary | ICD-10-CM

## 2016-04-11 DIAGNOSIS — F419 Anxiety disorder, unspecified: Secondary | ICD-10-CM | POA: Diagnosis not present

## 2016-04-11 DIAGNOSIS — R600 Localized edema: Secondary | ICD-10-CM

## 2016-04-11 DIAGNOSIS — M7989 Other specified soft tissue disorders: Secondary | ICD-10-CM | POA: Diagnosis not present

## 2016-04-11 DIAGNOSIS — L0390 Cellulitis, unspecified: Secondary | ICD-10-CM | POA: Insufficient documentation

## 2016-04-11 DIAGNOSIS — G4700 Insomnia, unspecified: Secondary | ICD-10-CM

## 2016-04-11 MED ORDER — ZOLPIDEM TARTRATE 10 MG PO TABS: 10.0000 mg | ORAL_TABLET | Freq: Every evening | ORAL | Status: DC | PRN

## 2016-04-11 MED ORDER — CITALOPRAM HYDROBROMIDE 20 MG PO TABS: 20.0000 mg | ORAL_TABLET | ORAL | Status: DC

## 2016-04-11 MED ORDER — CEPHALEXIN 500 MG PO CAPS: 500.0000 mg | ORAL_CAPSULE | Freq: Three times a day (TID) | ORAL | Status: DC

## 2016-04-11 NOTE — Progress Notes (Signed)
Patient: Nancy Downs Female    DOB: 08/28/1936   80 y.o.   MRN: [id number] Visit Date: 04/11/2016  Today's Provider: Margarita Rana, MD   Chief Complaint  Patient presents with  . Leg Swelling    right leg red and swollen times 1 week   Subjective:    HPI Edema: Patient complains of edema. The location of the edema is in knee down on the right. .  The edema has been mild.  Onset of symptoms was 7 days ago, gradually improving since that time. The edema is present all day. The patient states never.  The swelling has been aggravated by flying or long car trips, relieved by nothing, and been associated with nothing. Cardiac risk factors include advanced age (older than 60 for men, 64 for women). Does have history of DVT in the distant past.   Concerned if this is the same. Has been in [place].  Got back Saturday. Started swelling while she was gone. Feels like it started as a sunburn. Did do a lot more walking. Had to use her cane secondary to knee and hip pain.   Has been told that needs a new knee on the right. Had cortisone shots before she left.     Also needs her medication for anxiety refilled and  her sleep medication.      Allergies  Allergen Reactions  . Sodium Tetradecyl Sulfate Shortness Of Breath    SOB, coughing, rash neck/chest    Previous Medications   ALPRAZOLAM (XANAX) 0.5 MG TABLET    TAKE 1 TABLET BY MOUTH EVERY 8 HOURS AS NEEDED (NOT COVERED BY INSURANCE)   ASPIRIN EC 325 MG TABLET    1 tab a day for the next 30 days to prevent blood clots   CALCIUM CARBONATE (OS-CAL) 600 MG TABS TABLET    Take 600 mg by mouth 2 (two) times daily with a meal.   CELECOXIB (CELEBREX) 200 MG CAPSULE    Take 1 capsule (200 mg total) by mouth daily.   CITALOPRAM (CELEXA) 20 MG TABLET    Take 20 mg by mouth every morning.    CLOBETASOL CREAM (TEMOVATE) 0.05 %    Apply 1 application topically 2 (two) times daily as needed (skin rash on legs).   ERGOCALCIFEROL (VITAMIN D2) 50000 UNITS  CAPSULE    Take 1 capsule (50,000 Units total) by mouth once a week. Tuesday   LORATADINE (CLARITIN) 10 MG TABLET    Take 10 mg by mouth daily.   LOVASTATIN (MEVACOR) 10 MG TABLET    Take 1 tablet (10 mg total) by mouth at bedtime.   MONTELUKAST (SINGULAIR) 10 MG TABLET    TAKE ONE TABLET EVERY DAY   OMEGA-3 FATTY ACIDS (FISH OIL PO)    Take 1 capsule by mouth daily.   SENNOSIDES-DOCUSATE SODIUM (SENOKOT-S) 8.6-50 MG TABLET    Take 2 tablets by mouth 2 (two) times daily as needed for constipation. For Constipation     ZOLPIDEM (AMBIEN) 10 MG TABLET    Take 1 tablet (10 mg total) by mouth at bedtime as needed for sleep.    Review of Systems  Constitutional: Negative for fever, chills, activity change and appetite change.  Musculoskeletal: Positive for joint swelling (walking with a cane.  ). Negative for gait problem.    Social History  Substance Use Topics  . Smoking status: Never Smoker   . Smokeless tobacco: Never Used  . Alcohol Use: Yes  Comment: occasional   Objective:   BP 130/68 mmHg  Pulse 80  Temp(Src) 98 F (36.7 C) (Oral)  Resp 20  Wt 191 lb (86.637 kg)  Physical Exam  Constitutional: She is oriented to person, place, and time. She appears well-developed and well-nourished.  Cardiovascular: Normal rate and regular rhythm.   Pulmonary/Chest: Effort normal and breath sounds normal.  Musculoskeletal: She exhibits edema.  On top of knee.  Does have some erythema on top of the knee. Has FROM.    No pain with internal or external rotation.    Neurological: She is alert and oriented to person, place, and time.  Psychiatric: She has a normal mood and affect. Her behavior is normal. Judgment and thought content normal.      Assessment & Plan:     1. Edema of right lower extremity New problem. History of DVT. Planning car travel today. Will check ultrasound first.   - US Venous Img Lower Unilateral Right; Future  2. Cellulitis of right lower extremity New problem.  Condition is worsening. Will start medication for better control. Patient instructed to call back if condition worsens or does not improve.    - cephALEXin (KEFLEX) 500 MG capsule; Take 1 capsule (500 mg total) by mouth 3 (three) times daily.  Dispense: 21 capsule; Refill: 0  3. Anxiety disorder, unspecified anxiety disorder type Has a lot of stress. Will refill medication. Recheck at CPE.   - citalopram (CELEXA) 20 MG tablet; Take 1 tablet (20 mg total) by mouth every morning.  Dispense: 90 tablet; Refill: 3  4. Cannot sleep Will refill medication.  - zolpidem (AMBIEN) 10 MG tablet; Take 1 tablet (10 mg total) by mouth at bedtime as needed for sleep.  Dispense: 30 tablet; Refill: [place], MD  [facility]

## 2016-04-19 ENCOUNTER — Other Ambulatory Visit: Payer: Self-pay | Admitting: Family Medicine

## 2016-04-19 DIAGNOSIS — E785 Hyperlipidemia, unspecified: Secondary | ICD-10-CM

## 2016-04-25 DIAGNOSIS — K769 Liver disease, unspecified: Secondary | ICD-10-CM | POA: Diagnosis not present

## 2016-04-25 DIAGNOSIS — R932 Abnormal findings on diagnostic imaging of liver and biliary tract: Secondary | ICD-10-CM | POA: Diagnosis not present

## 2016-04-25 DIAGNOSIS — R935 Abnormal findings on diagnostic imaging of other abdominal regions, including retroperitoneum: Secondary | ICD-10-CM | POA: Diagnosis not present

## 2016-04-25 DIAGNOSIS — K7689 Other specified diseases of liver: Secondary | ICD-10-CM | POA: Diagnosis not present

## 2016-04-25 DIAGNOSIS — C569 Malignant neoplasm of unspecified ovary: Secondary | ICD-10-CM | POA: Diagnosis not present

## 2016-04-30 DIAGNOSIS — Z79899 Other long term (current) drug therapy: Secondary | ICD-10-CM | POA: Diagnosis not present

## 2016-04-30 DIAGNOSIS — C787 Secondary malignant neoplasm of liver and intrahepatic bile duct: Secondary | ICD-10-CM | POA: Diagnosis not present

## 2016-04-30 DIAGNOSIS — C569 Malignant neoplasm of unspecified ovary: Secondary | ICD-10-CM | POA: Diagnosis not present

## 2016-04-30 DIAGNOSIS — Z6829 Body mass index (BMI) 29.0-29.9, adult: Secondary | ICD-10-CM | POA: Diagnosis not present

## 2016-05-04 DIAGNOSIS — Z5111 Encounter for antineoplastic chemotherapy: Secondary | ICD-10-CM | POA: Insufficient documentation

## 2016-05-11 DIAGNOSIS — Z0189 Encounter for other specified special examinations: Secondary | ICD-10-CM | POA: Diagnosis not present

## 2016-05-11 DIAGNOSIS — R590 Localized enlarged lymph nodes: Secondary | ICD-10-CM | POA: Diagnosis not present

## 2016-05-11 DIAGNOSIS — C569 Malignant neoplasm of unspecified ovary: Secondary | ICD-10-CM | POA: Diagnosis not present

## 2016-05-11 DIAGNOSIS — R918 Other nonspecific abnormal finding of lung field: Secondary | ICD-10-CM | POA: Diagnosis not present

## 2016-05-11 DIAGNOSIS — C562 Malignant neoplasm of left ovary: Secondary | ICD-10-CM | POA: Diagnosis not present

## 2016-05-11 DIAGNOSIS — J90 Pleural effusion, not elsewhere classified: Secondary | ICD-10-CM | POA: Diagnosis not present

## 2016-05-16 ENCOUNTER — Encounter: Payer: Self-pay | Admitting: Family Medicine

## 2016-05-16 ENCOUNTER — Ambulatory Visit (INDEPENDENT_AMBULATORY_CARE_PROVIDER_SITE_OTHER): Payer: Medicare Other | Admitting: Family Medicine

## 2016-05-16 VITALS — BP 136/78 | HR 76 | Temp 98.0°F | Resp 16 | Wt 191.0 lb

## 2016-05-16 DIAGNOSIS — Z0000 Encounter for general adult medical examination without abnormal findings: Secondary | ICD-10-CM | POA: Diagnosis not present

## 2016-05-16 DIAGNOSIS — M76891 Other specified enthesopathies of right lower limb, excluding foot: Secondary | ICD-10-CM | POA: Diagnosis not present

## 2016-05-16 DIAGNOSIS — R0602 Shortness of breath: Secondary | ICD-10-CM

## 2016-05-16 DIAGNOSIS — R188 Other ascites: Secondary | ICD-10-CM | POA: Diagnosis not present

## 2016-05-16 DIAGNOSIS — G4700 Insomnia, unspecified: Secondary | ICD-10-CM

## 2016-05-16 DIAGNOSIS — J90 Pleural effusion, not elsewhere classified: Secondary | ICD-10-CM | POA: Diagnosis not present

## 2016-05-16 DIAGNOSIS — C569 Malignant neoplasm of unspecified ovary: Secondary | ICD-10-CM | POA: Diagnosis not present

## 2016-05-16 DIAGNOSIS — F419 Anxiety disorder, unspecified: Secondary | ICD-10-CM

## 2016-05-16 DIAGNOSIS — M7601 Gluteal tendinitis, right hip: Secondary | ICD-10-CM | POA: Diagnosis not present

## 2016-05-16 DIAGNOSIS — C787 Secondary malignant neoplasm of liver and intrahepatic bile duct: Secondary | ICD-10-CM | POA: Diagnosis not present

## 2016-05-16 DIAGNOSIS — M76892 Other specified enthesopathies of left lower limb, excluding foot: Secondary | ICD-10-CM | POA: Diagnosis not present

## 2016-05-16 DIAGNOSIS — M7602 Gluteal tendinitis, left hip: Secondary | ICD-10-CM | POA: Diagnosis not present

## 2016-05-16 DIAGNOSIS — K669 Disorder of peritoneum, unspecified: Secondary | ICD-10-CM | POA: Diagnosis not present

## 2016-05-16 MED ORDER — ALPRAZOLAM 0.5 MG PO TABS: ORAL_TABLET | ORAL | Status: DC

## 2016-05-16 MED ORDER — ZOLPIDEM TARTRATE 10 MG PO TABS: 10.0000 mg | ORAL_TABLET | Freq: Every evening | ORAL | Status: DC | PRN

## 2016-05-16 NOTE — Progress Notes (Signed)
Patient ID: [name], female   DOB: 02/07/1936, 80 y.o.   MRN: [id number]         Patient: Nancy Downs, Female    DOB: 02-26-1936, 80 y.o.   MRN: [id number] Visit Date: 05/16/2016  Today's Provider: Margarita Rana, MD   Chief Complaint  Patient presents with  . Medicare Wellness   Subjective:    Annual wellness visit Nancy Downs is a 80 y.o. female. She feels fairly well. She reports exercising twice a week at the hospital. She reports she is sleeping fairly well. Does have recurrent ovarian cancer.   Needs to start treatment. Is having increased SOB.  Does have pleural effusion on CXR.    -----------------------------------------------------------   Review of Systems  Constitutional: Negative.   Eyes: Negative.   Respiratory: Positive for cough (Dry cough) and shortness of breath. Negative for apnea, choking, chest tightness, wheezing and stridor.   Cardiovascular: Negative.   Gastrointestinal: Positive for nausea, abdominal pain, constipation and abdominal distention. Negative for vomiting, diarrhea, blood in stool, anal bleeding and rectal pain.  Endocrine: Negative for cold intolerance, heat intolerance, polydipsia, polyphagia and polyuria.  Genitourinary: Negative.   Musculoskeletal: Positive for myalgias (Muscle cramps.), back pain, joint swelling (Knees swell) and arthralgias. Negative for gait problem, neck pain and neck stiffness.  Skin: Positive for rash. Negative for color change, pallor and wound.  Allergic/Immunologic: Positive for environmental allergies. Negative for food allergies and immunocompromised state.  Neurological: Positive for dizziness, light-headedness and headaches. Negative for tremors, seizures, syncope, facial asymmetry, speech difficulty, weakness and numbness.  Hematological: Negative for adenopathy. Does not bruise/bleed easily.  Psychiatric/Behavioral: Negative for suicidal ideas, hallucinations, behavioral problems, confusion, sleep  disturbance, self-injury, dysphoric mood, decreased concentration and agitation. The patient is nervous/anxious. The patient is not hyperactive.     Social History   Social History  . Marital Status: Divorced    Spouse Name: N/A  . Number of Children: N/A  . Years of Education: N/A   Occupational History  . Not on file.   Social History Main Topics  . Smoking status: Never Smoker   . Smokeless tobacco: Never Used  . Alcohol Use: Yes     Comment: occasional  . Drug Use: No  . Sexual Activity: Not on file   Other Topics Concern  . Not on file   Social History Narrative    Past Medical History  Diagnosis Date  . Hyperlipidemia   . Arthritis   . Anemia   . Cough   . Constipation   . Easy bruising   . Fatigue   . Bronchitis 08/2011  . Cancer ([place])     liver  . Primary localized osteoarthritis of left knee   . PONV (postoperative nausea and vomiting)     happened just with knee surgery  2010  . Anxiety      Patient Active Problem List   Diagnosis Date Noted  . Encounter for antineoplastic chemotherapy 05/04/2016  . Edema leg 04/11/2016  . Cellulitis 04/11/2016  . Allergic rhinitis 12/07/2015  . Secondary malignant neoplasm of liver ([place]) 12/07/2015  . Clinical depression 12/07/2015  . Elevated blood sugar 12/07/2015  . Acid reflux 12/07/2015  . Abdominal hernia 12/07/2015  . H/O: hysterectomy 12/07/2015  . Cannot sleep 12/07/2015  . Gonalgia 12/07/2015  . Cheek swelling 12/07/2015  . LBP (low back pain) 12/07/2015  . Cancer, metastatic ([place]) 12/07/2015  . Change in blood platelet count 12/07/2015  . Herpes zona 12/07/2015  .  History of appendectomy 11/23/2015  . Cancer of ovary ([place]) 11/23/2015  . Primary cancer of ovary ([place]) 11/23/2015  . DJD (degenerative joint disease) of knee 12/20/2014  . 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 Primary localized osteoarthritis of left knee   . Positive test for genetic breast cancer susceptibility marker  09/08/2012  . ERRONEOUS ENCOUNTER--DISREGARD 10/17/2011  . Abdominal wall cellulitis 10/14/2011  . Anemia 10/14/2011  . Fever 10/14/2011  . HLD (hyperlipidemia) 10/14/2011  . UTI (lower urinary tract infection) 10/14/2011  . Carcinoma of unknown primary ([place]) 09/27/2011  . Umbilical mass [id number]  . Alopecia 04/12/2010  . Local superficial swelling, mass or lump 04/12/2010  . Avitaminosis D 04/12/2010  . Abnormal blood chemistry 11/22/2009  . Adenopathy 11/22/2009  . Malaise and fatigue 11/22/2009  . Anxiety disorder 08/07/2007  . Leg varices 08/07/2007    Past Surgical History  Procedure Laterality Date  . Tonsillectomy and adenoidectomy  1945  . Appendectomy  1966  . Spider vein injection  1968    varicose veins stripped   . Abdominal hysterectomy  1977    uterine cancer   . Ankle surgery  1996    broken ankle plates and pins put in place   . Knee surgery  2004/2012  . Portacath placement  10/23/2011    Procedure: INSERTION PORT-A-CATH;  Surgeon: Odis Hollingshead, MD;  Location: [facility];  Service: General;  Laterality: Right;  insertion right internal jugular port-a-cath  . Ovary surgery      robotic BSO for ovarian cancer '13  . Total knee arthroplasty Left 12/20/2014    Procedure: LEFT TOTAL KNEE ARTHROPLASTY;  Surgeon: Lorn Junes, MD;  Location: [facility];  Service: Orthopedics;  Laterality: Left;    Her family history includes Cancer in her sister and sister; Cancer (age of onset: 54) in her father; Cancer (age of onset: 52) in her mother.    No outpatient prescriptions have been marked as taking for the 05/16/16 encounter (Office Visit) with Margarita Rana, MD.    Patient Care Team: Margarita Rana, MD as PCP - General (Family Medicine)   .MEDST  Objective:   Vitals: BP 136/78 mmHg  Pulse 76  Temp(Src) 98 F (36.7 C) (Oral)  Resp 16  Wt 191 lb (86.637 kg)  Physical Exam  Constitutional: She is oriented to person, place, and time. She appears well-developed  and well-nourished.  HENT:  Head: Normocephalic and atraumatic.  Right Ear: External ear normal.  Left Ear: External ear normal.  Mouth/Throat: Oropharynx is clear and moist.  Eyes: Conjunctivae and EOM are normal. Pupils are equal, round, and reactive to light.  Neck: Normal range of motion. Neck supple. Carotid bruit is not present.  Cardiovascular: Normal rate, regular rhythm and normal heart sounds.   Pulmonary/Chest: Effort normal. She has decreased breath sounds in the right lower field.  Neurological: She is alert and oriented to person, place, and time. She has normal reflexes.  Skin: Skin is warm and dry.  Psychiatric: She has a normal mood and affect. Her behavior is normal. Judgment and thought content normal.    Activities of Daily Living In your present state of health, do you have any difficulty performing the following activities: 05/16/2016  Hearing? N  Vision? N  Difficulty concentrating or making decisions? Y  Walking or climbing stairs? Y  Dressing or bathing? N  Doing errands, shopping? N    Fall Risk Assessment Fall Risk  05/16/2016  Falls in the past year? No  Depression Screen PHQ 2/9 Scores 05/16/2016  PHQ - 2 Score 1    Cognitive Testing - 6-CIT  Correct? Score   What year is it? yes 0 0 or 4  What month is it? yes 0 0 or 3  Memorize:    Pia Mau,  44,  [address],  [place],      What time is it? (within 1 hour) yes 0 0 or 3  Count backwards from 20 no 2 0, 2, or 4  Name the months of the year yes 0 0, 2, or 4  Repeat name & address above yes 0 0, 2, 4, 6, 8, or 10       TOTAL SCORE  0/28   Interpretation:  Normal  Normal (0-7) Abnormal (8-28)       Assessment & Plan:     Annual Wellness Visit  Reviewed patient's Family Medical History Reviewed and updated list of patient's medical providers Assessment of cognitive impairment was done Assessed patient's functional ability Established a written schedule for health screening  [place] Completed and Reviewed  Exercise Activities and Dietary recommendations Goals    None      Immunization History  Administered Date(s) Administered  . Influenza Whole 10/30/2007, 10/13/2008  . Influenza-Unspecified 08/10/2014  . Pneumococcal Conjugate-13 03/02/2015  . Pneumococcal Polysaccharide-23 09/25/2015  . Td 12/10/2000, 04/12/2010  . Zoster 01/19/2013    Health Maintenance  Topic Date Due  . INFLUENZA VACCINE  07/10/2016  . TETANUS/TDAP  04/12/2020  . DEXA SCAN  Completed  . ZOSTAVAX  Completed  . PNA vac Low Risk Adult  Completed      Discussed health benefits of physical activity, and encouraged her to engage in regular exercise appropriate for her age and condition.    ------------------------------------------------------------------------------------------------------------  2. Cannot sleep Stable; refills provided.   - zolpidem (AMBIEN) 10 MG tablet; Take 1 tablet (10 mg total) by mouth at bedtime as needed for sleep.  Dispense: 90 tablet; Refill: 1  3. Anxiety disorder, unspecified anxiety disorder type Stable; refills provided.   - ALPRAZolam (XANAX) 0.5 MG tablet; TAKE 1 TABLET BY MOUTH EVERY 8 HOURS AS NEEDED (NOT COVERED BY INSURANCE)  Dispense: 180 tablet; Refill: 1  4. SOB (shortness of breath) New problem will check labs to rule out  Heart failure. Suspect related to effusion that will be treated by Oncology.   - B Nat Peptide Patient was seen and examined by Jerrell Belfast, MD, and note scribed by Ashley Royalty, CMA.   I have reviewed the document for accuracy and completeness and I agree with above. Jerrell Belfast, MD   Margarita Rana, MD

## 2016-05-17 LAB — BRAIN NATRIURETIC PEPTIDE: BNP: 9.5 pg/mL (ref 0.0–100.0)

## 2016-05-18 ENCOUNTER — Telehealth: Payer: Self-pay

## 2016-05-18 ENCOUNTER — Other Ambulatory Visit: Payer: Self-pay | Admitting: Family Medicine

## 2016-05-18 DIAGNOSIS — J309 Allergic rhinitis, unspecified: Secondary | ICD-10-CM

## 2016-05-18 DIAGNOSIS — E785 Hyperlipidemia, unspecified: Secondary | ICD-10-CM

## 2016-05-18 DIAGNOSIS — F419 Anxiety disorder, unspecified: Secondary | ICD-10-CM

## 2016-05-18 NOTE — Telephone Encounter (Signed)
-----   Message from Margarita Rana, MD sent at 05/17/2016  1:47 PM EDT ----- BNP normal.  Follow up with oncology as scheduled. Thanks.

## 2016-05-18 NOTE — Telephone Encounter (Signed)
Printed, please fax or call in to pharmacy. Thank you.   

## 2016-05-18 NOTE — Telephone Encounter (Signed)
Pt advised.   Thanks,   -Laura  

## 2016-05-22 DIAGNOSIS — Z9221 Personal history of antineoplastic chemotherapy: Secondary | ICD-10-CM | POA: Diagnosis not present

## 2016-05-22 DIAGNOSIS — C569 Malignant neoplasm of unspecified ovary: Secondary | ICD-10-CM | POA: Diagnosis not present

## 2016-05-22 DIAGNOSIS — R110 Nausea: Secondary | ICD-10-CM | POA: Diagnosis not present

## 2016-05-22 DIAGNOSIS — R109 Unspecified abdominal pain: Secondary | ICD-10-CM | POA: Diagnosis not present

## 2016-05-22 DIAGNOSIS — R140 Abdominal distension (gaseous): Secondary | ICD-10-CM | POA: Diagnosis not present

## 2016-05-22 DIAGNOSIS — Z79899 Other long term (current) drug therapy: Secondary | ICD-10-CM | POA: Diagnosis not present

## 2016-05-22 DIAGNOSIS — R200 Anesthesia of skin: Secondary | ICD-10-CM | POA: Diagnosis not present

## 2016-05-22 DIAGNOSIS — Z7982 Long term (current) use of aspirin: Secondary | ICD-10-CM | POA: Diagnosis not present

## 2016-05-22 DIAGNOSIS — Z90722 Acquired absence of ovaries, bilateral: Secondary | ICD-10-CM | POA: Diagnosis not present

## 2016-05-22 DIAGNOSIS — Z90710 Acquired absence of both cervix and uterus: Secondary | ICD-10-CM | POA: Diagnosis not present

## 2016-05-29 DIAGNOSIS — C569 Malignant neoplasm of unspecified ovary: Secondary | ICD-10-CM | POA: Diagnosis not present

## 2016-06-05 DIAGNOSIS — C569 Malignant neoplasm of unspecified ovary: Secondary | ICD-10-CM | POA: Diagnosis not present

## 2016-06-05 LAB — CBC AND DIFFERENTIAL
HEMATOCRIT: 34 % — AB (ref 36–46)
HEMOGLOBIN: 11.4 g/dL — AB (ref 12.0–16.0)
Neutrophils Absolute: 5 /uL
Platelets: 265 10*3/uL (ref 150–399)
WBC: 6.7 10^3/mL

## 2016-06-06 ENCOUNTER — Encounter: Payer: Self-pay | Admitting: Family Medicine

## 2016-06-18 DIAGNOSIS — C569 Malignant neoplasm of unspecified ovary: Secondary | ICD-10-CM | POA: Diagnosis not present

## 2016-06-18 DIAGNOSIS — Z6829 Body mass index (BMI) 29.0-29.9, adult: Secondary | ICD-10-CM | POA: Diagnosis not present

## 2016-06-18 DIAGNOSIS — C787 Secondary malignant neoplasm of liver and intrahepatic bile duct: Secondary | ICD-10-CM | POA: Diagnosis not present

## 2016-06-18 DIAGNOSIS — Z006 Encounter for examination for normal comparison and control in clinical research program: Secondary | ICD-10-CM | POA: Diagnosis not present

## 2016-06-18 DIAGNOSIS — Z79899 Other long term (current) drug therapy: Secondary | ICD-10-CM | POA: Diagnosis not present

## 2016-06-22 DIAGNOSIS — C569 Malignant neoplasm of unspecified ovary: Secondary | ICD-10-CM | POA: Diagnosis not present

## 2016-06-22 DIAGNOSIS — Z5111 Encounter for antineoplastic chemotherapy: Secondary | ICD-10-CM | POA: Diagnosis not present

## 2016-07-02 DIAGNOSIS — Z1231 Encounter for screening mammogram for malignant neoplasm of breast: Secondary | ICD-10-CM | POA: Diagnosis not present

## 2016-07-02 DIAGNOSIS — Z6829 Body mass index (BMI) 29.0-29.9, adult: Secondary | ICD-10-CM | POA: Diagnosis not present

## 2016-07-02 LAB — HM MAMMOGRAPHY

## 2016-07-16 DIAGNOSIS — J929 Pleural plaque without asbestos: Secondary | ICD-10-CM | POA: Diagnosis not present

## 2016-07-16 DIAGNOSIS — K7689 Other specified diseases of liver: Secondary | ICD-10-CM | POA: Diagnosis not present

## 2016-07-16 DIAGNOSIS — C562 Malignant neoplasm of left ovary: Secondary | ICD-10-CM | POA: Diagnosis not present

## 2016-07-16 DIAGNOSIS — Z79899 Other long term (current) drug therapy: Secondary | ICD-10-CM | POA: Diagnosis not present

## 2016-07-16 DIAGNOSIS — Z6829 Body mass index (BMI) 29.0-29.9, adult: Secondary | ICD-10-CM | POA: Diagnosis not present

## 2016-07-16 DIAGNOSIS — K8689 Other specified diseases of pancreas: Secondary | ICD-10-CM | POA: Diagnosis not present

## 2016-07-16 DIAGNOSIS — C569 Malignant neoplasm of unspecified ovary: Secondary | ICD-10-CM | POA: Diagnosis not present

## 2016-07-16 DIAGNOSIS — J90 Pleural effusion, not elsewhere classified: Secondary | ICD-10-CM | POA: Diagnosis not present

## 2016-08-09 ENCOUNTER — Other Ambulatory Visit: Payer: Self-pay | Admitting: Family Medicine

## 2016-08-09 DIAGNOSIS — E559 Vitamin D deficiency, unspecified: Secondary | ICD-10-CM

## 2016-08-16 DIAGNOSIS — Z006 Encounter for examination for normal comparison and control in clinical research program: Secondary | ICD-10-CM | POA: Diagnosis not present

## 2016-08-16 DIAGNOSIS — C569 Malignant neoplasm of unspecified ovary: Secondary | ICD-10-CM | POA: Diagnosis not present

## 2016-08-16 DIAGNOSIS — Z79899 Other long term (current) drug therapy: Secondary | ICD-10-CM | POA: Diagnosis not present

## 2016-09-10 DIAGNOSIS — Z8041 Family history of malignant neoplasm of ovary: Secondary | ICD-10-CM | POA: Diagnosis not present

## 2016-09-10 DIAGNOSIS — K429 Umbilical hernia without obstruction or gangrene: Secondary | ICD-10-CM | POA: Diagnosis not present

## 2016-09-10 DIAGNOSIS — Z86718 Personal history of other venous thrombosis and embolism: Secondary | ICD-10-CM | POA: Diagnosis not present

## 2016-09-10 DIAGNOSIS — Z006 Encounter for examination for normal comparison and control in clinical research program: Secondary | ICD-10-CM | POA: Diagnosis not present

## 2016-09-10 DIAGNOSIS — Z90721 Acquired absence of ovaries, unilateral: Secondary | ICD-10-CM | POA: Diagnosis not present

## 2016-09-10 DIAGNOSIS — Z6829 Body mass index (BMI) 29.0-29.9, adult: Secondary | ICD-10-CM | POA: Diagnosis not present

## 2016-09-10 DIAGNOSIS — Z09 Encounter for follow-up examination after completed treatment for conditions other than malignant neoplasm: Secondary | ICD-10-CM | POA: Diagnosis not present

## 2016-09-10 DIAGNOSIS — D1809 Hemangioma of other sites: Secondary | ICD-10-CM | POA: Diagnosis not present

## 2016-09-10 DIAGNOSIS — Z803 Family history of malignant neoplasm of breast: Secondary | ICD-10-CM | POA: Diagnosis not present

## 2016-09-10 DIAGNOSIS — C569 Malignant neoplasm of unspecified ovary: Secondary | ICD-10-CM | POA: Diagnosis not present

## 2016-09-10 DIAGNOSIS — Z7982 Long term (current) use of aspirin: Secondary | ICD-10-CM | POA: Diagnosis not present

## 2016-09-10 DIAGNOSIS — Z9221 Personal history of antineoplastic chemotherapy: Secondary | ICD-10-CM | POA: Diagnosis not present

## 2016-09-10 DIAGNOSIS — R112 Nausea with vomiting, unspecified: Secondary | ICD-10-CM | POA: Diagnosis not present

## 2016-09-10 DIAGNOSIS — Z1502 Genetic susceptibility to malignant neoplasm of ovary: Secondary | ICD-10-CM | POA: Diagnosis not present

## 2016-09-10 DIAGNOSIS — D1803 Hemangioma of intra-abdominal structures: Secondary | ICD-10-CM | POA: Diagnosis not present

## 2016-09-10 DIAGNOSIS — K7689 Other specified diseases of liver: Secondary | ICD-10-CM | POA: Diagnosis not present

## 2016-09-10 DIAGNOSIS — Z1501 Genetic susceptibility to malignant neoplasm of breast: Secondary | ICD-10-CM | POA: Diagnosis not present

## 2016-09-10 DIAGNOSIS — Z90710 Acquired absence of both cervix and uterus: Secondary | ICD-10-CM | POA: Diagnosis not present

## 2016-09-11 DIAGNOSIS — M545 Low back pain: Secondary | ICD-10-CM | POA: Diagnosis not present

## 2016-09-27 DIAGNOSIS — S335XXA Sprain of ligaments of lumbar spine, initial encounter: Secondary | ICD-10-CM | POA: Diagnosis not present

## 2016-09-27 DIAGNOSIS — M25552 Pain in left hip: Secondary | ICD-10-CM | POA: Diagnosis not present

## 2016-10-02 DIAGNOSIS — J8410 Pulmonary fibrosis, unspecified: Secondary | ICD-10-CM | POA: Diagnosis not present

## 2016-10-02 DIAGNOSIS — K429 Umbilical hernia without obstruction or gangrene: Secondary | ICD-10-CM | POA: Diagnosis not present

## 2016-10-02 DIAGNOSIS — K7689 Other specified diseases of liver: Secondary | ICD-10-CM | POA: Diagnosis not present

## 2016-10-02 DIAGNOSIS — R918 Other nonspecific abnormal finding of lung field: Secondary | ICD-10-CM | POA: Diagnosis not present

## 2016-10-02 DIAGNOSIS — K862 Cyst of pancreas: Secondary | ICD-10-CM | POA: Diagnosis not present

## 2016-10-02 DIAGNOSIS — C569 Malignant neoplasm of unspecified ovary: Secondary | ICD-10-CM | POA: Diagnosis not present

## 2016-10-02 DIAGNOSIS — C562 Malignant neoplasm of left ovary: Secondary | ICD-10-CM | POA: Diagnosis not present

## 2016-10-02 DIAGNOSIS — J9811 Atelectasis: Secondary | ICD-10-CM | POA: Diagnosis not present

## 2016-10-05 ENCOUNTER — Ambulatory Visit: Payer: Medicare Other

## 2016-10-05 DIAGNOSIS — C569 Malignant neoplasm of unspecified ovary: Secondary | ICD-10-CM | POA: Diagnosis not present

## 2016-10-08 DIAGNOSIS — Z801 Family history of malignant neoplasm of trachea, bronchus and lung: Secondary | ICD-10-CM | POA: Diagnosis not present

## 2016-10-08 DIAGNOSIS — C569 Malignant neoplasm of unspecified ovary: Secondary | ICD-10-CM | POA: Diagnosis not present

## 2016-10-08 DIAGNOSIS — Z9221 Personal history of antineoplastic chemotherapy: Secondary | ICD-10-CM | POA: Diagnosis not present

## 2016-10-08 DIAGNOSIS — Z6829 Body mass index (BMI) 29.0-29.9, adult: Secondary | ICD-10-CM | POA: Diagnosis not present

## 2016-10-08 DIAGNOSIS — Z90710 Acquired absence of both cervix and uterus: Secondary | ICD-10-CM | POA: Diagnosis not present

## 2016-10-08 DIAGNOSIS — M25561 Pain in right knee: Secondary | ICD-10-CM | POA: Diagnosis not present

## 2016-10-08 DIAGNOSIS — R918 Other nonspecific abnormal finding of lung field: Secondary | ICD-10-CM | POA: Diagnosis not present

## 2016-10-08 DIAGNOSIS — Z9079 Acquired absence of other genital organ(s): Secondary | ICD-10-CM | POA: Diagnosis not present

## 2016-10-08 DIAGNOSIS — Z888 Allergy status to other drugs, medicaments and biological substances status: Secondary | ICD-10-CM | POA: Diagnosis not present

## 2016-10-08 DIAGNOSIS — Z79899 Other long term (current) drug therapy: Secondary | ICD-10-CM | POA: Diagnosis not present

## 2016-10-08 DIAGNOSIS — Z8041 Family history of malignant neoplasm of ovary: Secondary | ICD-10-CM | POA: Diagnosis not present

## 2016-10-08 DIAGNOSIS — Z86718 Personal history of other venous thrombosis and embolism: Secondary | ICD-10-CM | POA: Diagnosis not present

## 2016-10-08 DIAGNOSIS — C562 Malignant neoplasm of left ovary: Secondary | ICD-10-CM | POA: Diagnosis not present

## 2016-10-08 DIAGNOSIS — C787 Secondary malignant neoplasm of liver and intrahepatic bile duct: Secondary | ICD-10-CM | POA: Diagnosis not present

## 2016-10-08 DIAGNOSIS — T451X5D Adverse effect of antineoplastic and immunosuppressive drugs, subsequent encounter: Secondary | ICD-10-CM | POA: Diagnosis not present

## 2016-10-08 DIAGNOSIS — M549 Dorsalgia, unspecified: Secondary | ICD-10-CM | POA: Diagnosis not present

## 2016-10-08 DIAGNOSIS — Z006 Encounter for examination for normal comparison and control in clinical research program: Secondary | ICD-10-CM | POA: Diagnosis not present

## 2016-10-08 DIAGNOSIS — Z90722 Acquired absence of ovaries, bilateral: Secondary | ICD-10-CM | POA: Diagnosis not present

## 2016-10-08 DIAGNOSIS — Z803 Family history of malignant neoplasm of breast: Secondary | ICD-10-CM | POA: Diagnosis not present

## 2016-10-11 ENCOUNTER — Ambulatory Visit (INDEPENDENT_AMBULATORY_CARE_PROVIDER_SITE_OTHER): Payer: Medicare Other

## 2016-10-11 DIAGNOSIS — Z23 Encounter for immunization: Secondary | ICD-10-CM

## 2016-10-12 ENCOUNTER — Ambulatory Visit: Payer: Medicare Other

## 2016-10-15 ENCOUNTER — Other Ambulatory Visit: Payer: Self-pay | Admitting: Family Medicine

## 2016-10-15 DIAGNOSIS — E785 Hyperlipidemia, unspecified: Secondary | ICD-10-CM

## 2016-10-26 IMAGING — US US EXTREM LOW VENOUS*R*
1 series · 14 of 24 positions shown · non-contrast
Comparison: None.

CLINICAL DATA: Right lower extremity swelling and erythema for 1
week

EXAM:
RIGHT LOWER EXTREMITY VENOUS DUPLEX ULTRASOUND
TECHNIQUE: Doppler venous assessment of the left lower extremity deep venous
system was performed, including characterization of spectral flow,
compressibility, and phasicity.

[Series 1: us extrem low venous*right* · 0.08mm/px · 14 of 34 slices shown]
[im 1/34]
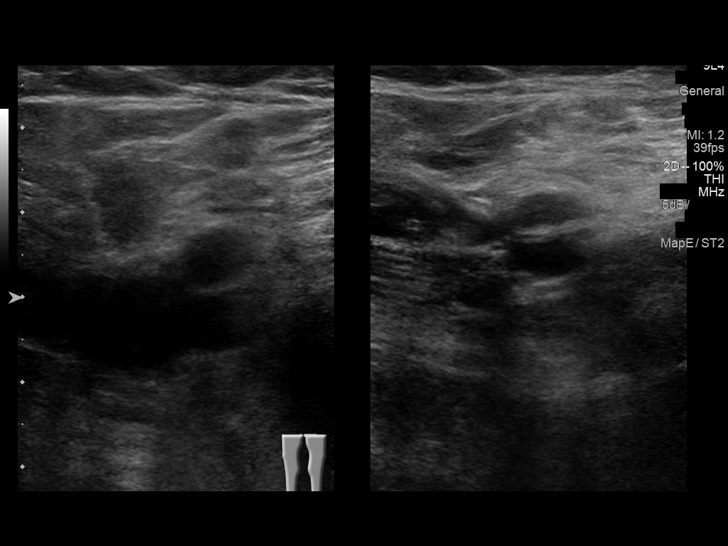
[im 3/34]
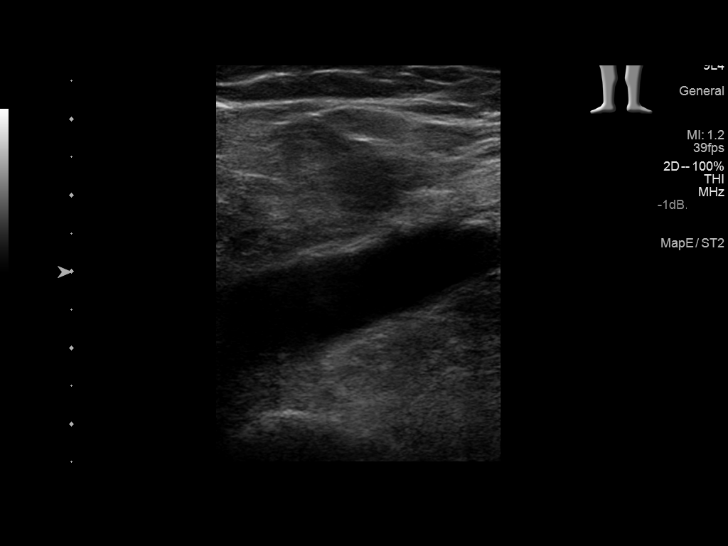
[im 6/34]
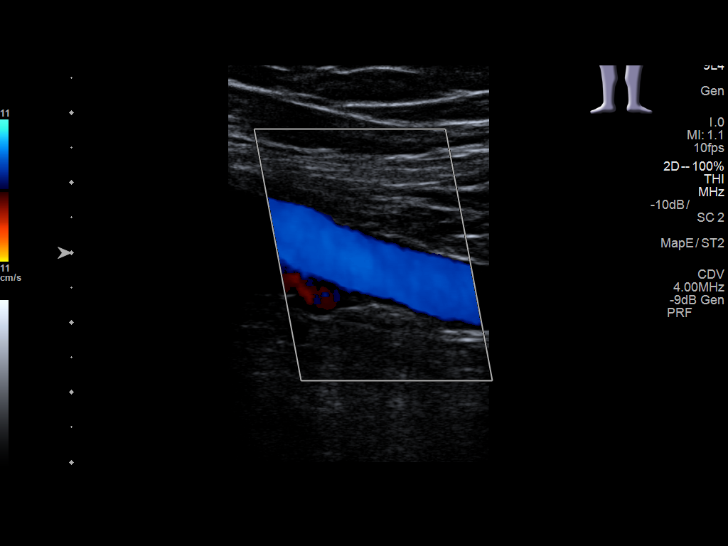
[im 9/34]
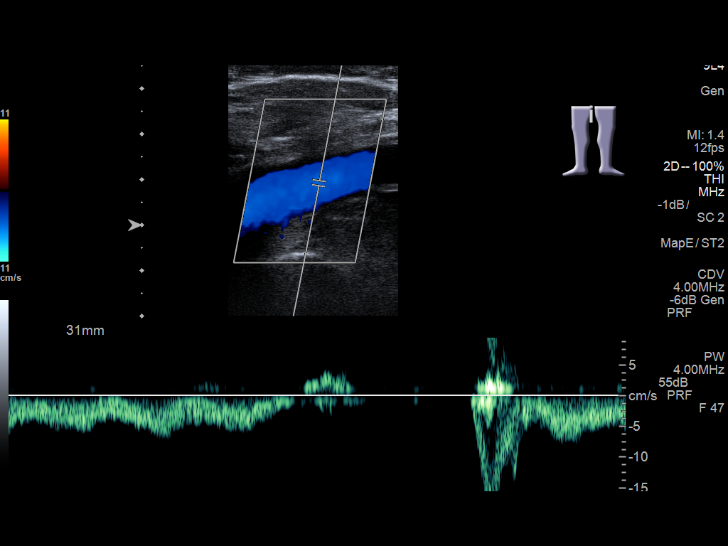
[im 11/34]
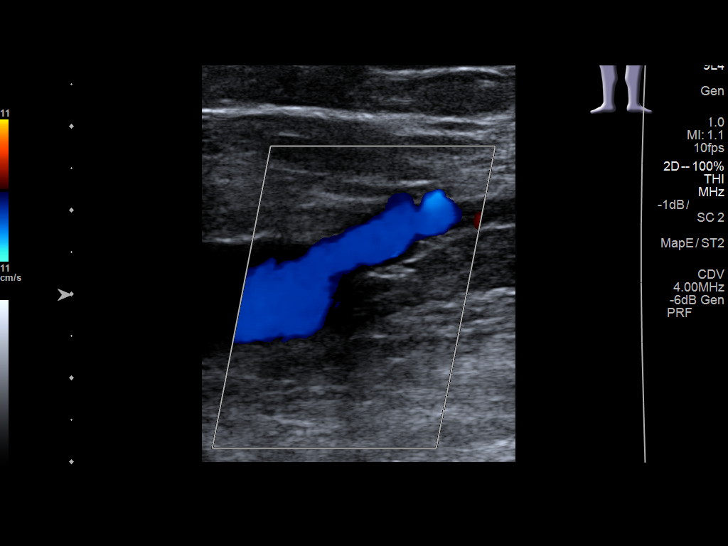
[im 13/34]
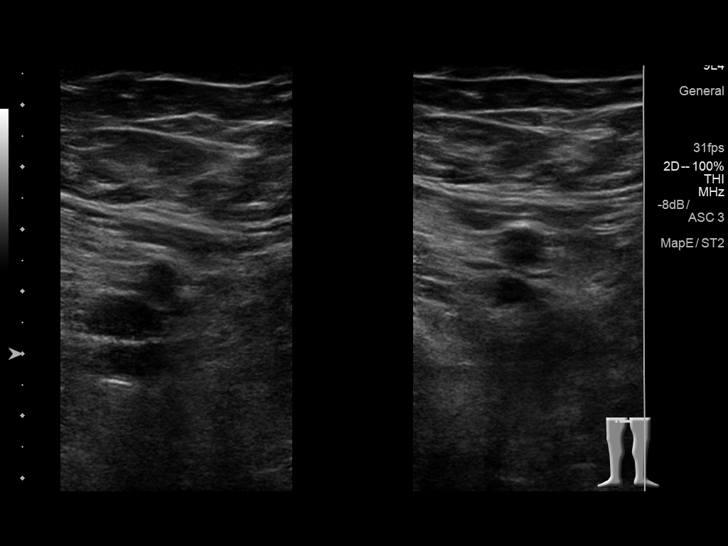
[im 16/34]
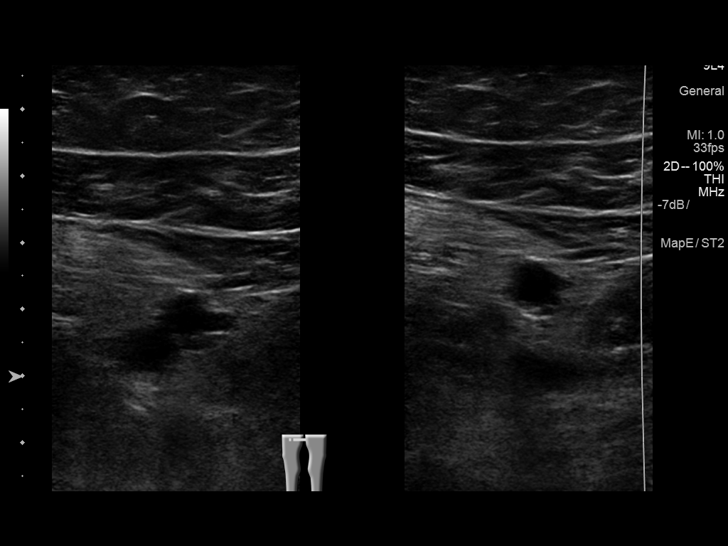
[im 18/34]
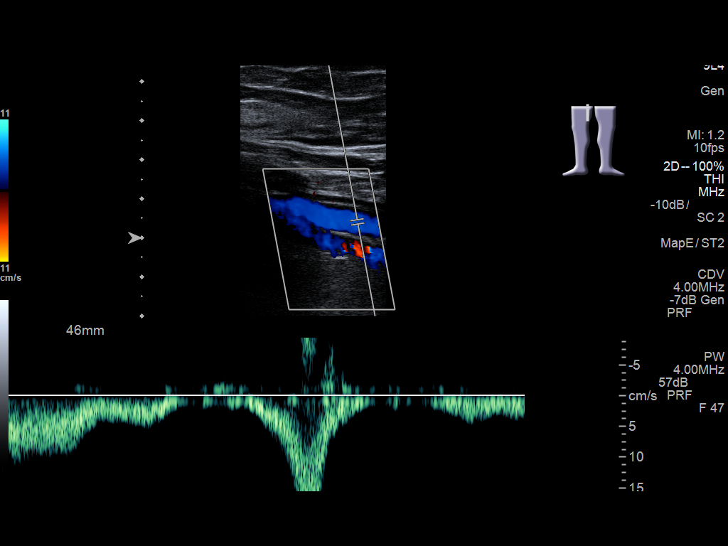
[im 21/34]
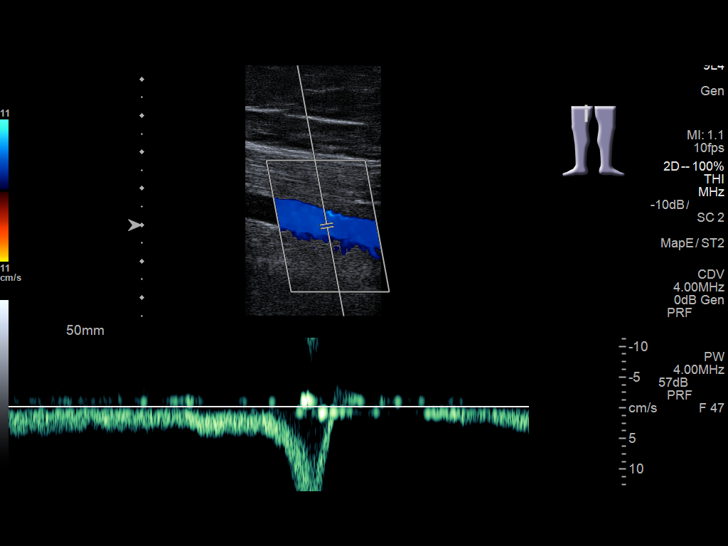
[im 23/34]
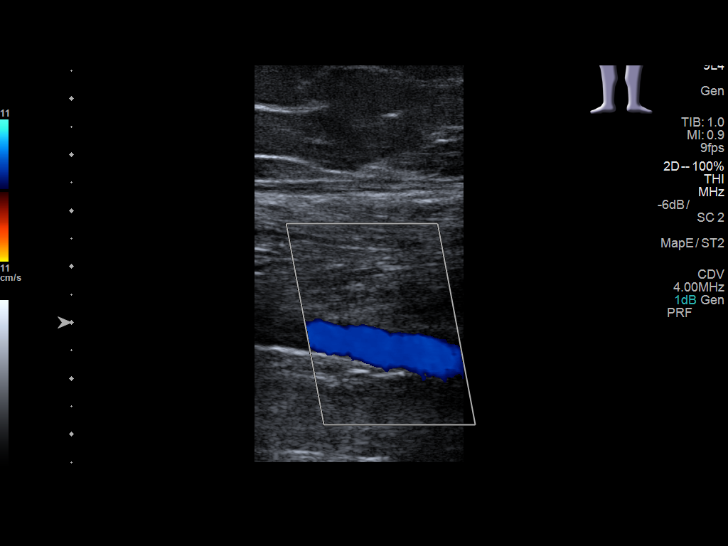
[im 26/34]
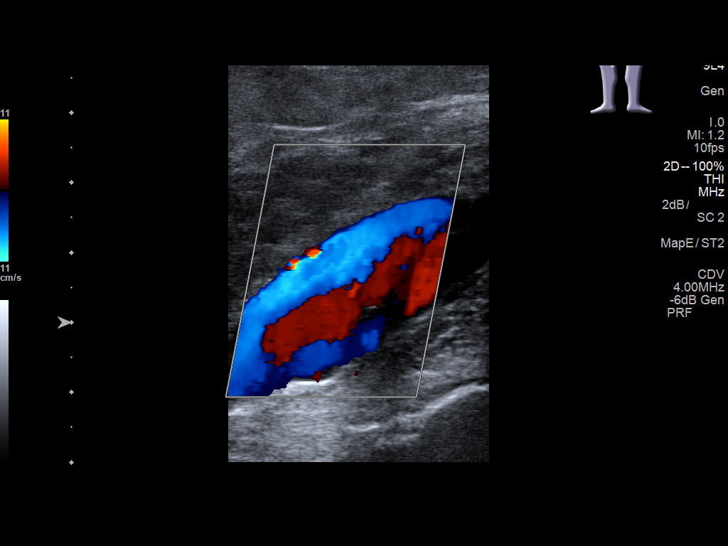
[im 28/34]
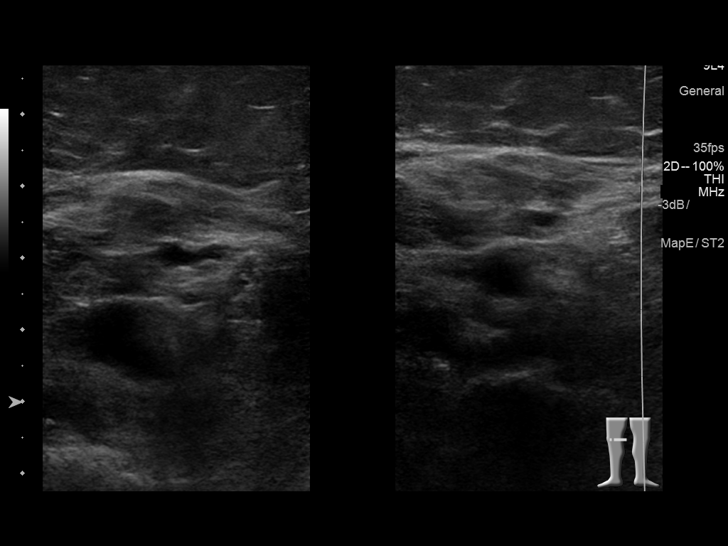
[im 31/34]
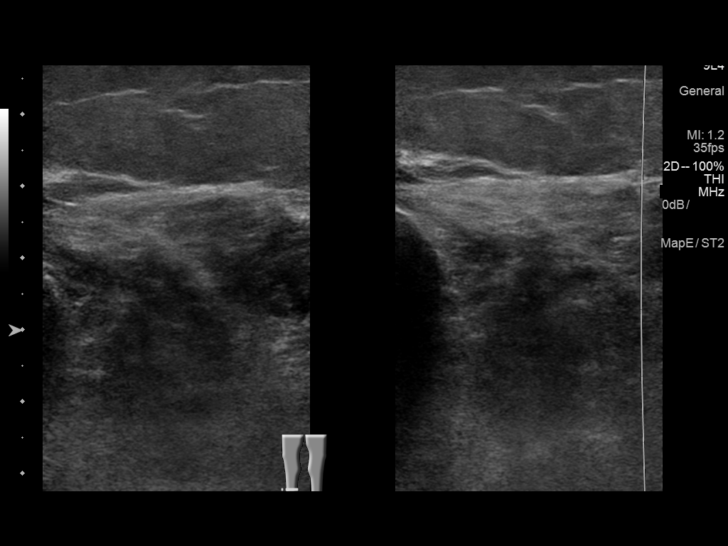
[im 34/34]
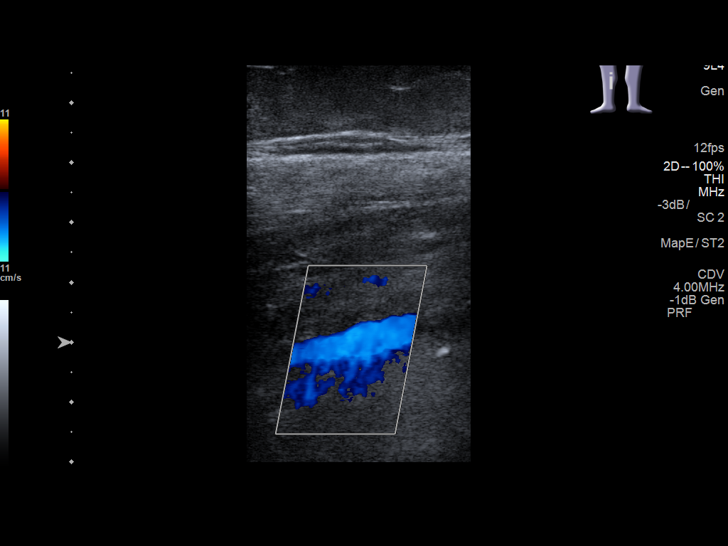

[14 of 24 positions shown; findings below may reference images not displayed]

FINDINGS: There is complete compressibility of the right common femoral,
femoral, and popliteal veins. Doppler analysis demonstrates
respiratory phasicity and augmentation of flow with calf
compression.
IMPRESSION: No evidence of right lower extremity DVT.

## 2016-11-05 DIAGNOSIS — Z1501 Genetic susceptibility to malignant neoplasm of breast: Secondary | ICD-10-CM | POA: Diagnosis not present

## 2016-11-05 DIAGNOSIS — Z7982 Long term (current) use of aspirin: Secondary | ICD-10-CM | POA: Diagnosis not present

## 2016-11-05 DIAGNOSIS — Z1502 Genetic susceptibility to malignant neoplasm of ovary: Secondary | ICD-10-CM | POA: Diagnosis not present

## 2016-11-05 DIAGNOSIS — Z90710 Acquired absence of both cervix and uterus: Secondary | ICD-10-CM | POA: Diagnosis not present

## 2016-11-05 DIAGNOSIS — R634 Abnormal weight loss: Secondary | ICD-10-CM | POA: Diagnosis not present

## 2016-11-05 DIAGNOSIS — C569 Malignant neoplasm of unspecified ovary: Secondary | ICD-10-CM | POA: Diagnosis not present

## 2016-11-05 DIAGNOSIS — Z5111 Encounter for antineoplastic chemotherapy: Secondary | ICD-10-CM | POA: Diagnosis not present

## 2016-11-05 DIAGNOSIS — Z79891 Long term (current) use of opiate analgesic: Secondary | ICD-10-CM | POA: Diagnosis not present

## 2016-11-05 DIAGNOSIS — Z6829 Body mass index (BMI) 29.0-29.9, adult: Secondary | ICD-10-CM | POA: Diagnosis not present

## 2016-11-05 DIAGNOSIS — R5383 Other fatigue: Secondary | ICD-10-CM | POA: Diagnosis not present

## 2016-11-05 DIAGNOSIS — D1809 Hemangioma of other sites: Secondary | ICD-10-CM | POA: Diagnosis not present

## 2016-11-05 DIAGNOSIS — Z803 Family history of malignant neoplasm of breast: Secondary | ICD-10-CM | POA: Diagnosis not present

## 2016-11-05 DIAGNOSIS — Z9221 Personal history of antineoplastic chemotherapy: Secondary | ICD-10-CM | POA: Diagnosis not present

## 2016-11-05 DIAGNOSIS — R600 Localized edema: Secondary | ICD-10-CM | POA: Diagnosis not present

## 2016-11-05 DIAGNOSIS — Z79899 Other long term (current) drug therapy: Secondary | ICD-10-CM | POA: Diagnosis not present

## 2016-11-05 DIAGNOSIS — R110 Nausea: Secondary | ICD-10-CM | POA: Diagnosis not present

## 2016-11-05 DIAGNOSIS — Z8041 Family history of malignant neoplasm of ovary: Secondary | ICD-10-CM | POA: Diagnosis not present

## 2016-11-05 DIAGNOSIS — M549 Dorsalgia, unspecified: Secondary | ICD-10-CM | POA: Diagnosis not present

## 2016-11-12 DIAGNOSIS — D1803 Hemangioma of intra-abdominal structures: Secondary | ICD-10-CM | POA: Diagnosis not present

## 2016-11-12 DIAGNOSIS — C569 Malignant neoplasm of unspecified ovary: Secondary | ICD-10-CM | POA: Diagnosis not present

## 2016-11-12 DIAGNOSIS — Z8041 Family history of malignant neoplasm of ovary: Secondary | ICD-10-CM | POA: Diagnosis not present

## 2016-11-12 DIAGNOSIS — Z7982 Long term (current) use of aspirin: Secondary | ICD-10-CM | POA: Diagnosis not present

## 2016-11-12 DIAGNOSIS — Z90710 Acquired absence of both cervix and uterus: Secondary | ICD-10-CM | POA: Diagnosis not present

## 2016-11-12 DIAGNOSIS — K429 Umbilical hernia without obstruction or gangrene: Secondary | ICD-10-CM | POA: Diagnosis not present

## 2016-11-12 DIAGNOSIS — Z1501 Genetic susceptibility to malignant neoplasm of breast: Secondary | ICD-10-CM | POA: Diagnosis not present

## 2016-11-12 DIAGNOSIS — C787 Secondary malignant neoplasm of liver and intrahepatic bile duct: Secondary | ICD-10-CM | POA: Diagnosis not present

## 2016-11-12 DIAGNOSIS — Z8542 Personal history of malignant neoplasm of other parts of uterus: Secondary | ICD-10-CM | POA: Diagnosis not present

## 2016-11-12 DIAGNOSIS — Z79899 Other long term (current) drug therapy: Secondary | ICD-10-CM | POA: Diagnosis not present

## 2016-11-12 DIAGNOSIS — Z803 Family history of malignant neoplasm of breast: Secondary | ICD-10-CM | POA: Diagnosis not present

## 2016-11-12 DIAGNOSIS — Z9221 Personal history of antineoplastic chemotherapy: Secondary | ICD-10-CM | POA: Diagnosis not present

## 2016-11-12 DIAGNOSIS — Z1502 Genetic susceptibility to malignant neoplasm of ovary: Secondary | ICD-10-CM | POA: Diagnosis not present

## 2016-11-15 ENCOUNTER — Ambulatory Visit (INDEPENDENT_AMBULATORY_CARE_PROVIDER_SITE_OTHER): Payer: Medicare Other | Admitting: Family Medicine

## 2016-11-15 ENCOUNTER — Encounter: Payer: Self-pay | Admitting: Family Medicine

## 2016-11-15 VITALS — BP 104/60 | HR 78 | Temp 97.8°F | Resp 14 | Wt 188.0 lb

## 2016-11-15 DIAGNOSIS — E785 Hyperlipidemia, unspecified: Secondary | ICD-10-CM | POA: Diagnosis not present

## 2016-11-15 DIAGNOSIS — G459 Transient cerebral ischemic attack, unspecified: Secondary | ICD-10-CM

## 2016-11-15 MED ORDER — ROSUVASTATIN CALCIUM 10 MG PO TABS: 10.0000 mg | ORAL_TABLET | Freq: Every day | ORAL | 12 refills | Status: DC

## 2016-11-15 NOTE — Patient Instructions (Addendum)
Take 325 mg aspirin. Stop Lovastatin and start Rosuvastatin.

## 2016-11-15 NOTE — Progress Notes (Signed)
Subjective:  HPI Pt was a Pt of Dr. Sharyon Medicus, she was introduced to Dr. Rosanna Randy when Dr. Venia Minks was still here. Pt is here today for what she thinks could be coming from a cancer research drug. She reports that she woke up in the middle of the night and felt like her face was tingling. She went to look in the mirror and did not see anything wrong so she went back to bed. When she got up she felt like her face was drooping on the right side, then her daughter noticed her drooling out of the right side of her mouth. Pt denies any confusion that she knows of. She told her cancer research doctor what happened and they told her they did not think it was the drug and to see her PCP. They did blood work and it showed that she was a little anemic. Pt reports that she has not felt well in several weeks. A couple of days before this episode she had a headache and she gets dizzy often.   Prior to Admission medications   Medication Sig Start Date End Date Taking? Authorizing Provider  ALPRAZolam Duanne Moron) 0.5 MG tablet TAKE 1 TABLET BY MOUTH EVERY 8 HOURS AS NEEDED 05/18/16   Margarita Rana, MD  aspirin EC 325 MG tablet 1 tab a day for the next 30 days to prevent blood clots 12/21/14   Kirstin Shepperson, PA-C  calcium carbonate (OS-CAL) 600 MG TABS tablet Take 600 mg by mouth 2 (two) times daily with a meal.    Historical Provider, MD  celecoxib (CELEBREX) 200 MG capsule Take 1 capsule (200 mg total) by mouth daily. 12/21/14   Kirstin Shepperson, PA-C  cephALEXin (KEFLEX) 500 MG capsule Take 1 capsule (500 mg total) by mouth 3 (three) times daily. 04/11/16   Margarita Rana, MD  citalopram (CELEXA) 20 MG tablet Take 1 tablet (20 mg total) by mouth every morning. 04/11/16   Margarita Rana, MD  clobetasol cream (TEMOVATE) 4.13 % Apply 1 application topically 2 (two) times daily as needed (skin rash on legs).    Historical Provider, MD  loratadine (CLARITIN) 10 MG tablet Take 10 mg by mouth daily.    Historical Provider,  MD  lovastatin (MEVACOR) 10 MG tablet TAKE ONE TABLET AT BEDTIME 10/16/16   Jerrol Banana., MD  montelukast (SINGULAIR) 10 MG tablet TAKE 1 TABLET BY MOUTH DAILY 05/18/16   Margarita Rana, MD  Omega-3 Fatty Acids (FISH OIL PO) Take 1 capsule by mouth daily.    Historical Provider, MD  sennosides-docusate sodium (SENOKOT-S) 8.6-50 MG tablet Take 2 tablets by mouth 2 (two) times daily as needed for constipation. For Constipation      Historical Provider, MD  Vitamin D, Ergocalciferol, (DRISDOL) 50000 units CAPS capsule TAKE 1 CAPSULE BY MOUTH EVERY WEEK ON TUESDAYS 08/14/16   Jerrol Banana., MD  zolpidem (AMBIEN) 10 MG tablet Take 1 tablet (10 mg total) by mouth at bedtime as needed for sleep. 05/16/16   Margarita Rana, MD    Patient Active Problem List   Diagnosis Date Noted  . Encounter for antineoplastic chemotherapy 05/04/2016  . Edema leg 04/11/2016  . Cellulitis 04/11/2016  . Allergic rhinitis 12/07/2015  . Secondary malignant neoplasm of liver ([place]) 12/07/2015  . Clinical depression 12/07/2015  . Elevated blood sugar 12/07/2015  . Acid reflux 12/07/2015  . Abdominal hernia 12/07/2015  . H/O: hysterectomy 12/07/2015  . Cannot sleep 12/07/2015  . Gonalgia 12/07/2015  . Cheek  swelling 12/07/2015  . LBP (low back pain) 12/07/2015  . Cancer, metastatic ([place]) 12/07/2015  . Change in blood platelet count 12/07/2015  . Herpes zona 12/07/2015  . History of appendectomy 11/23/2015  . Cancer of ovary ([place]) 11/23/2015  . DJD (degenerative joint disease) of knee 12/20/2014  . 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 Primary localized osteoarthritis of left knee   . Positive test for genetic breast cancer susceptibility marker 09/08/2012  . Anxiety state 04/05/2012  . ERRONEOUS ENCOUNTER--DISREGARD 10/17/2011  . Abdominal wall cellulitis 10/14/2011  . Anemia 10/14/2011  . Fever 10/14/2011  . Hyperlipidemia 10/14/2011  . UTI (lower urinary tract infection) 10/14/2011  .  Alopecia 04/12/2010  . Local superficial swelling, mass or lump 04/12/2010  . Avitaminosis D 04/12/2010  . Abnormal blood chemistry 11/22/2009  . Adenopathy 11/22/2009  . Malaise and fatigue 11/22/2009  . Anxiety disorder 08/07/2007  . Leg varices 08/07/2007    Past Medical History:  Diagnosis Date  . Anemia   . Anxiety   . Arthritis   . Bronchitis 08/2011  . Cancer ([place])    liver  . Constipation   . Cough   . Easy bruising   . Fatigue   . Hyperlipidemia   . PONV (postoperative nausea and vomiting)    happened just with knee surgery  2010  . Primary localized osteoarthritis of left knee     Social History   Social History  . Marital status: Divorced    Spouse name: N/A  . Number of children: N/A  . Years of education: N/A   Occupational History  . Not on file.   Social History Main Topics  . Smoking status: Never Smoker  . Smokeless tobacco: Never Used  . Alcohol use Yes     Comment: occasional  . Drug use: No  . Sexual activity: Not on file   Other Topics Concern  . Not on file   Social History Narrative  . No narrative on file    Allergies  Allergen Reactions  . Sodium Tetradecyl Sulfate Shortness Of Breath    SOB, coughing, rash neck/chest     Review of Systems  Constitutional: Positive for malaise/fatigue.  HENT: Negative.   Eyes: Negative.   Respiratory: Negative.   Cardiovascular: Negative.   Gastrointestinal: Negative.   Genitourinary: Negative.   Musculoskeletal: Negative.   Skin: Negative.   Neurological: Positive for dizziness, tingling, weakness and headaches.       Other than facial symptoms she has been normal.  Endo/Heme/Allergies: Negative.   Psychiatric/Behavioral: Negative.     Immunization History  Administered Date(s) Administered  . Influenza Whole 10/30/2007, 10/13/2008  . Influenza, High Dose Seasonal PF 10/11/2016  . Influenza-Unspecified 08/10/2014  . Pneumococcal Conjugate-13 03/02/2015  . Pneumococcal  Polysaccharide-23 09/24/2012  . Td 12/10/2000, 04/12/2010  . Zoster 01/19/2013    Objective:  BP 104/60 (BP Location: Left Arm, Patient Position: Sitting, Cuff Size: Normal)   Pulse 78   Temp 97.8 F (36.6 C) (Oral)   Resp 14   Wt 188 lb (85.3 kg)   BMI 29.23 kg/m   Physical Exam  Constitutional: She is oriented to person, place, and time and well-developed, well-nourished, and in no distress.  HENT:  Head: Normocephalic and atraumatic.  Right Ear: External ear normal.  Left Ear: External ear normal.  Nose: Nose normal.  Mouth/Throat: No oropharyngeal exudate.  Eyes: Conjunctivae and EOM are normal. Pupils are equal, round, and reactive to light.  Neck: Normal range of motion. Neck  supple. No thyromegaly present.  Cardiovascular: Normal rate, regular rhythm, normal heart sounds and intact distal pulses.   Pulmonary/Chest: Effort normal and breath sounds normal.  Musculoskeletal: Normal range of motion.  Neurological: She is alert and oriented to person, place, and time. She has normal reflexes. No cranial nerve deficit. She exhibits normal muscle tone. Gait normal. Coordination normal. GCS score is 15.  Skin: Skin is warm and dry.  Psychiatric: Mood, memory, affect and judgment normal.    Lab Results  Component Value Date   WBC 7.1 10/07/2015   HGB 9.7 (L) 12/21/2014   HCT 36.8 10/07/2015   PLT 262 10/07/2015   GLUCOSE 100 (H) 10/07/2015   CHOL 216 (H) 10/07/2015   TRIG 130 10/07/2015   HDL 87 10/07/2015   LDLCALC 103 (H) 10/07/2015   TSH 2.57 02/04/2015   INR 0.98 12/09/2014   HGBA1C 5.8 01/16/2007    CMP     Component Value Date/Time   NA 141 10/07/2015 0821   K 4.7 10/07/2015 0821   CL 100 10/07/2015 0821   CO2 27 10/07/2015 0821   GLUCOSE 100 (H) 10/07/2015 0821   GLUCOSE 152 (H) 12/21/2014 0611   BUN 19 10/07/2015 0821   CREATININE 0.75 10/07/2015 0821   CREATININE 0.71 09/27/2011 1724   CALCIUM 9.4 10/07/2015 0821   PROT 7.0 10/07/2015 0821    ALBUMIN 4.3 10/07/2015 0821   AST 13 10/07/2015 0821   ALT 11 10/07/2015 0821   ALKPHOS 83 10/07/2015 0821   BILITOT 0.4 10/07/2015 0821   GFRNONAA 77 10/07/2015 0821   GFRAA 89 10/07/2015 0821    Assessment and Plan :  1. Hyperlipidemia, unspecified hyperlipidemia type  - rosuvastatin (CRESTOR) 10 MG tablet; Take 1 tablet (10 mg total) by mouth daily.  Dispense: 30 tablet; Refill: 12  2. Transient cerebral ischemia, unspecified type vs possible  Bells Palsy/resolved Likely the cause of symptoms. Change to Crestor 10 mg and get carotid doppler. Follow up in 3-4 weeks. - US Carotid Duplex Bilateral; Future 3.Ovaria Cancer  HPI, Exam, and A&P Transcribed under the direction and in the presence of Richard L. Cranford Mon, MD  Electronically Signed: Webb Laws, CMA I have done the exam and reviewed the above chart and it is accurate to the best of my knowledge. Development worker, community has been used in this note in any air is in the dictation or transcription are unintentional.  [facility] 11/15/2016 2:44 PM

## 2016-11-19 ENCOUNTER — Ambulatory Visit
Admission: RE | Admit: 2016-11-19 | Discharge: 2016-11-19 | Disposition: A | Discharge status: Home / Self Care | Payer: Medicare Other | Source: Ambulatory Visit | Attending: Family Medicine | Admitting: Family Medicine

## 2016-11-19 DIAGNOSIS — G459 Transient cerebral ischemic attack, unspecified: Secondary | ICD-10-CM | POA: Diagnosis not present

## 2016-11-19 DIAGNOSIS — I6523 Occlusion and stenosis of bilateral carotid arteries: Secondary | ICD-10-CM | POA: Diagnosis not present

## 2016-11-20 ENCOUNTER — Telehealth: Payer: Self-pay

## 2016-11-20 NOTE — Telephone Encounter (Signed)
Pt advised. Emily Drozdowski, CMA  

## 2016-11-20 NOTE — Telephone Encounter (Signed)
lmtcb Emily Drozdowski, CMA  

## 2016-11-20 NOTE — Telephone Encounter (Signed)
-----   Message from Jerrol Banana., MD sent at 11/20/2016  8:07 AM EST ----- Carotids okay

## 2016-11-27 DIAGNOSIS — J9811 Atelectasis: Secondary | ICD-10-CM | POA: Diagnosis not present

## 2016-11-27 DIAGNOSIS — K862 Cyst of pancreas: Secondary | ICD-10-CM | POA: Diagnosis not present

## 2016-11-27 DIAGNOSIS — C562 Malignant neoplasm of left ovary: Secondary | ICD-10-CM | POA: Diagnosis not present

## 2016-11-27 DIAGNOSIS — K769 Liver disease, unspecified: Secondary | ICD-10-CM | POA: Diagnosis not present

## 2016-11-27 DIAGNOSIS — R911 Solitary pulmonary nodule: Secondary | ICD-10-CM | POA: Diagnosis not present

## 2016-12-06 DIAGNOSIS — R110 Nausea: Secondary | ICD-10-CM | POA: Diagnosis not present

## 2016-12-06 DIAGNOSIS — Z7982 Long term (current) use of aspirin: Secondary | ICD-10-CM | POA: Diagnosis not present

## 2016-12-06 DIAGNOSIS — Z9221 Personal history of antineoplastic chemotherapy: Secondary | ICD-10-CM | POA: Diagnosis not present

## 2016-12-06 DIAGNOSIS — Z79899 Other long term (current) drug therapy: Secondary | ICD-10-CM | POA: Diagnosis not present

## 2016-12-06 DIAGNOSIS — Z86718 Personal history of other venous thrombosis and embolism: Secondary | ICD-10-CM | POA: Diagnosis not present

## 2016-12-06 DIAGNOSIS — G629 Polyneuropathy, unspecified: Secondary | ICD-10-CM | POA: Diagnosis not present

## 2016-12-06 DIAGNOSIS — Z8589 Personal history of malignant neoplasm of other organs and systems: Secondary | ICD-10-CM | POA: Diagnosis not present

## 2016-12-06 DIAGNOSIS — Z79891 Long term (current) use of opiate analgesic: Secondary | ICD-10-CM | POA: Diagnosis not present

## 2016-12-06 DIAGNOSIS — C787 Secondary malignant neoplasm of liver and intrahepatic bile duct: Secondary | ICD-10-CM | POA: Diagnosis not present

## 2016-12-06 DIAGNOSIS — C569 Malignant neoplasm of unspecified ovary: Secondary | ICD-10-CM | POA: Diagnosis not present

## 2016-12-06 DIAGNOSIS — Z8543 Personal history of malignant neoplasm of ovary: Secondary | ICD-10-CM | POA: Diagnosis not present

## 2016-12-06 DIAGNOSIS — Z8541 Personal history of malignant neoplasm of cervix uteri: Secondary | ICD-10-CM | POA: Diagnosis not present

## 2016-12-06 DIAGNOSIS — K862 Cyst of pancreas: Secondary | ICD-10-CM | POA: Diagnosis not present

## 2016-12-06 DIAGNOSIS — Z802 Family history of malignant neoplasm of other respiratory and intrathoracic organs: Secondary | ICD-10-CM | POA: Diagnosis not present

## 2016-12-06 DIAGNOSIS — Z90710 Acquired absence of both cervix and uterus: Secondary | ICD-10-CM | POA: Diagnosis not present

## 2016-12-06 DIAGNOSIS — Z8041 Family history of malignant neoplasm of ovary: Secondary | ICD-10-CM | POA: Diagnosis not present

## 2016-12-06 DIAGNOSIS — Z006 Encounter for examination for normal comparison and control in clinical research program: Secondary | ICD-10-CM | POA: Diagnosis not present

## 2016-12-06 DIAGNOSIS — Z803 Family history of malignant neoplasm of breast: Secondary | ICD-10-CM | POA: Diagnosis not present

## 2016-12-06 DIAGNOSIS — Z9289 Personal history of other medical treatment: Secondary | ICD-10-CM | POA: Diagnosis not present

## 2016-12-06 DIAGNOSIS — K5900 Constipation, unspecified: Secondary | ICD-10-CM | POA: Diagnosis not present

## 2016-12-11 DIAGNOSIS — M545 Low back pain: Secondary | ICD-10-CM | POA: Diagnosis not present

## 2016-12-25 ENCOUNTER — Ambulatory Visit (INDEPENDENT_AMBULATORY_CARE_PROVIDER_SITE_OTHER): Payer: Medicare Other | Admitting: Family Medicine

## 2016-12-25 VITALS — BP 110/60 | HR 76 | Temp 97.8°F | Resp 16 | Wt 181.0 lb

## 2016-12-25 DIAGNOSIS — E785 Hyperlipidemia, unspecified: Secondary | ICD-10-CM

## 2016-12-25 NOTE — Progress Notes (Signed)
Nancy Downs  MRN: [id number] DOB: 1936/03/12  Subjective:  HPI   The patient is a 81 year old female who presents for follow up of a TIA vs Bells Palsy.  She was last seen on 11/15/16.  She states she feels lilke she is totally back to normal.  She no longer has any drooping of the lip. Overall she feels very well.  Patient Active Problem List   Diagnosis Date Noted  . Encounter for antineoplastic chemotherapy 05/04/2016  . Edema leg 04/11/2016  . Cellulitis 04/11/2016  . Allergic rhinitis 12/07/2015  . 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 Clinical depression 12/07/2015  . Elevated blood sugar 12/07/2015  . Acid reflux 12/07/2015  . Abdominal hernia 12/07/2015  . H/O: hysterectomy 12/07/2015  . Cannot sleep 12/07/2015  . Gonalgia 12/07/2015  . Cheek swelling 12/07/2015  . LBP (low back pain) 12/07/2015  . Cancer, metastatic ([place]) 12/07/2015  . Change in blood platelet count 12/07/2015  . Herpes zona 12/07/2015  . History of appendectomy 11/23/2015  . Cancer of ovary ([place]) 11/23/2015  . DJD (degenerative joint disease) of knee 12/20/2014  . 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 Primary localized osteoarthritis of left knee   . Positive test for genetic breast cancer susceptibility marker 09/08/2012  . Anxiety state 04/05/2012  . ERRONEOUS ENCOUNTER--DISREGARD 10/17/2011  . Abdominal wall cellulitis 10/14/2011  . Anemia 10/14/2011  . Fever 10/14/2011  . Hyperlipidemia 10/14/2011  . UTI (lower urinary tract infection) 10/14/2011  . Alopecia 04/12/2010  . Local superficial swelling, mass or lump 04/12/2010  . Avitaminosis D 04/12/2010  . Abnormal blood chemistry 11/22/2009  . Adenopathy 11/22/2009  . Malaise and fatigue 11/22/2009  . Anxiety disorder 08/07/2007  . Leg varices 08/07/2007    Past Medical History:  Diagnosis Date  . Anemia   . Anxiety   . Arthritis   . Bronchitis 08/2011  . Cancer ([place])    liver  . Constipation   .  Cough   . Easy bruising   . Fatigue   . Hyperlipidemia   . PONV (postoperative nausea and vomiting)    happened just with knee surgery  2010  . Primary localized osteoarthritis of left knee     Social History   Social History  . Marital status: Divorced    Spouse name: N/A  . Number of children: N/A  . Years of education: N/A   Occupational History  . Not on file.   Social History Main Topics  . Smoking status: Never Smoker  . Smokeless tobacco: Never Used  . Alcohol use Yes     Comment: occasional  . Drug use: No  . Sexual activity: Not on file   Other Topics Concern  . Not on file   Social History Narrative  . No narrative on file    Outpatient Encounter Prescriptions as of 12/25/2016  Medication Sig  . ALPRAZolam (XANAX) 0.5 MG tablet TAKE 1 TABLET BY MOUTH EVERY 8 HOURS AS NEEDED  . aspirin EC 325 MG tablet 1 tab a day for the next 30 days to prevent blood clots  . calcium carbonate (OS-CAL) 600 MG TABS tablet Take 600 mg by mouth 2 (two) times daily with a meal.  . celecoxib (CELEBREX) 200 MG capsule Take 1 capsule (200 mg total) by mouth daily.  . citalopram (CELEXA) 20 MG tablet Take 1 tablet (20 mg total) by mouth every morning.  . clobetasol cream (TEMOVATE) 4.58 % Apply 1 application topically  2 (two) times daily as needed (skin rash on legs).  . loratadine (CLARITIN) 10 MG tablet Take 10 mg by mouth daily.  . montelukast (SINGULAIR) 10 MG tablet TAKE 1 TABLET BY MOUTH DAILY  . rosuvastatin (CRESTOR) 10 MG tablet Take 1 tablet (10 mg total) by mouth daily.  . sennosides-docusate sodium (SENOKOT-S) 8.6-50 MG tablet Take 2 tablets by mouth 2 (two) times daily as needed for constipation. For Constipation    . Vitamin D, Ergocalciferol, (DRISDOL) 50000 units CAPS capsule TAKE 1 CAPSULE BY MOUTH EVERY WEEK ON TUESDAYS  . zolpidem (AMBIEN) 10 MG tablet Take 1 tablet (10 mg total) by mouth at bedtime as needed for sleep.  . [DISCONTINUED] cephALEXin (KEFLEX) 500  MG capsule Take 1 capsule (500 mg total) by mouth 3 (three) times daily.  . [DISCONTINUED] Omega-3 Fatty Acids (FISH OIL PO) Take 1 capsule by mouth daily.   No facility-administered encounter medications on file as of 12/25/2016.     Allergies  Allergen Reactions  . Sodium Tetradecyl Sulfate Shortness Of Breath    SOB, coughing, rash neck/chest     Review of Systems  Constitutional: Negative for fever and malaise/fatigue.  Respiratory: Negative for shortness of breath and wheezing.   Cardiovascular: Negative for chest pain, palpitations, orthopnea, claudication, leg swelling and PND.  Neurological: Negative for dizziness, weakness and headaches.    Objective:  BP 110/60 (BP Location: Right Arm, Patient Position: Sitting, Cuff Size: Normal)   Pulse 76   Temp 97.8 F (36.6 C) (Oral)   Resp 16   Wt 181 lb (82.1 kg)   BMI 28.14 kg/m   Physical Exam  Constitutional: She is oriented to person, place, and time and well-developed, well-nourished, and in no distress.  HENT:  Head: Normocephalic and atraumatic.  Eyes: Pupils are equal, round, and reactive to light.  Neck: Normal range of motion.  Cardiovascular: Normal rate, regular rhythm and normal heart sounds.   Pulmonary/Chest: Effort normal and breath sounds normal.  Neurological: She is alert and oriented to person, place, and time. No cranial nerve deficit. Gait normal.  Skin: Skin is warm and dry.  Psychiatric: Mood, memory, affect and judgment normal.    Assessment and Plan :  1. Hyperlipidemia, unspecified hyperlipidemia type  - Lipid Panel With LDL/HDL Ratio - Comprehensive metabolic panel - TSH 2. Bell's palsy Resolved. 3. Ovarian cancer Followed by oncology.  HPI, Exam and A&P Transcribed under the direction and in the presence of Miguel Aschoff, Brooke Bonito., MD. Electronically Signed: Althea Charon, RMA I have done the exam and reviewed the above chart and it is accurate to the best of my knowledge. Risk manager has been used in this note in any air is in the dictation or transcription are unintentional. I have done the exam and reviewed the chart and it is accurate to the best of my knowledge. Development worker, community has been used and  any errors in dictation or transcription are unintentional. Miguel Aschoff M.D. [facility]

## 2016-12-26 LAB — LIPID PANEL WITH LDL/HDL RATIO
Cholesterol, Total: 178 mg/dL (ref 100–199)
HDL: 96 mg/dL (ref >39)
LDL Calculated: 53 mg/dL (ref 0–99)
LDL/HDL RATIO: 0.6 ratio (ref 0.0–3.2)
Triglycerides: 144 mg/dL (ref 0–149)
VLDL Cholesterol Cal: 29 mg/dL (ref 5–40)

## 2016-12-26 LAB — COMPREHENSIVE METABOLIC PANEL
ALBUMIN: 4.5 g/dL (ref 3.5–4.8)
ALT: 11 IU/L (ref 0–32)
AST: 10 IU/L (ref 0–40)
Albumin/Globulin Ratio: 1.7 (ref 1.2–2.2)
Alkaline Phosphatase: 89 IU/L (ref 39–117)
BILIRUBIN TOTAL: 0.6 mg/dL (ref 0.0–1.2)
BUN / CREAT RATIO: 27 (ref 12–28)
BUN: 22 mg/dL (ref 8–27)
CALCIUM: 9.5 mg/dL (ref 8.7–10.3)
CHLORIDE: 99 mmol/L (ref 96–106)
CO2: 27 mmol/L (ref 18–29)
CREATININE: 0.81 mg/dL (ref 0.57–1.00)
GFR, EST AFRICAN AMERICAN: 80 mL/min/{1.73_m2} (ref >59)
GFR, EST NON AFRICAN AMERICAN: 70 mL/min/{1.73_m2} (ref >59)
GLUCOSE: 92 mg/dL (ref 65–99)
Globulin, Total: 2.6 g/dL (ref 1.5–4.5)
Potassium: 4.2 mmol/L (ref 3.5–5.2)
Sodium: 141 mmol/L (ref 134–144)
TOTAL PROTEIN: 7.1 g/dL (ref 6.0–8.5)

## 2016-12-26 LAB — TSH: TSH: 1.23 u[IU]/mL (ref 0.450–4.500)

## 2016-12-31 ENCOUNTER — Telehealth: Payer: Self-pay

## 2016-12-31 NOTE — Telephone Encounter (Signed)
Advised pt of lab results. Pt verbally acknowledges understanding. Adilene Areola Drozdowski, CMA   

## 2016-12-31 NOTE — Telephone Encounter (Signed)
-----   Message from Jerrol Banana., MD sent at 12/29/2016 10:39 AM EST ----- Labs OK

## 2017-01-04 DIAGNOSIS — C569 Malignant neoplasm of unspecified ovary: Secondary | ICD-10-CM | POA: Diagnosis not present

## 2017-01-07 DIAGNOSIS — Z1502 Genetic susceptibility to malignant neoplasm of ovary: Secondary | ICD-10-CM | POA: Diagnosis not present

## 2017-01-07 DIAGNOSIS — Z1501 Genetic susceptibility to malignant neoplasm of breast: Secondary | ICD-10-CM | POA: Diagnosis not present

## 2017-01-07 DIAGNOSIS — D1803 Hemangioma of intra-abdominal structures: Secondary | ICD-10-CM | POA: Diagnosis not present

## 2017-01-07 DIAGNOSIS — J929 Pleural plaque without asbestos: Secondary | ICD-10-CM | POA: Diagnosis not present

## 2017-01-07 DIAGNOSIS — C569 Malignant neoplasm of unspecified ovary: Secondary | ICD-10-CM | POA: Diagnosis not present

## 2017-01-07 DIAGNOSIS — Z006 Encounter for examination for normal comparison and control in clinical research program: Secondary | ICD-10-CM | POA: Diagnosis not present

## 2017-01-07 DIAGNOSIS — Z90710 Acquired absence of both cervix and uterus: Secondary | ICD-10-CM | POA: Diagnosis not present

## 2017-01-07 DIAGNOSIS — Z8041 Family history of malignant neoplasm of ovary: Secondary | ICD-10-CM | POA: Diagnosis not present

## 2017-01-07 DIAGNOSIS — C787 Secondary malignant neoplasm of liver and intrahepatic bile duct: Secondary | ICD-10-CM | POA: Diagnosis not present

## 2017-01-07 DIAGNOSIS — K769 Liver disease, unspecified: Secondary | ICD-10-CM | POA: Diagnosis not present

## 2017-01-07 DIAGNOSIS — K5900 Constipation, unspecified: Secondary | ICD-10-CM | POA: Diagnosis not present

## 2017-01-07 DIAGNOSIS — R918 Other nonspecific abnormal finding of lung field: Secondary | ICD-10-CM | POA: Diagnosis not present

## 2017-01-07 DIAGNOSIS — K429 Umbilical hernia without obstruction or gangrene: Secondary | ICD-10-CM | POA: Diagnosis not present

## 2017-01-07 DIAGNOSIS — G459 Transient cerebral ischemic attack, unspecified: Secondary | ICD-10-CM | POA: Diagnosis not present

## 2017-01-07 DIAGNOSIS — Z803 Family history of malignant neoplasm of breast: Secondary | ICD-10-CM | POA: Diagnosis not present

## 2017-02-01 DIAGNOSIS — K869 Disease of pancreas, unspecified: Secondary | ICD-10-CM | POA: Diagnosis not present

## 2017-02-01 DIAGNOSIS — C562 Malignant neoplasm of left ovary: Secondary | ICD-10-CM | POA: Diagnosis not present

## 2017-02-01 DIAGNOSIS — C569 Malignant neoplasm of unspecified ovary: Secondary | ICD-10-CM | POA: Diagnosis not present

## 2017-02-01 DIAGNOSIS — K769 Liver disease, unspecified: Secondary | ICD-10-CM | POA: Diagnosis not present

## 2017-02-01 DIAGNOSIS — J9811 Atelectasis: Secondary | ICD-10-CM | POA: Diagnosis not present

## 2017-02-01 DIAGNOSIS — K862 Cyst of pancreas: Secondary | ICD-10-CM | POA: Diagnosis not present

## 2017-02-01 DIAGNOSIS — R918 Other nonspecific abnormal finding of lung field: Secondary | ICD-10-CM | POA: Diagnosis not present

## 2017-02-04 DIAGNOSIS — Z1501 Genetic susceptibility to malignant neoplasm of breast: Secondary | ICD-10-CM | POA: Diagnosis not present

## 2017-02-04 DIAGNOSIS — Z1502 Genetic susceptibility to malignant neoplasm of ovary: Secondary | ICD-10-CM | POA: Diagnosis not present

## 2017-02-04 DIAGNOSIS — Z6828 Body mass index (BMI) 28.0-28.9, adult: Secondary | ICD-10-CM | POA: Diagnosis not present

## 2017-02-04 DIAGNOSIS — Z8542 Personal history of malignant neoplasm of other parts of uterus: Secondary | ICD-10-CM | POA: Diagnosis not present

## 2017-02-04 DIAGNOSIS — C561 Malignant neoplasm of right ovary: Secondary | ICD-10-CM | POA: Diagnosis not present

## 2017-02-04 DIAGNOSIS — Z8041 Family history of malignant neoplasm of ovary: Secondary | ICD-10-CM | POA: Diagnosis not present

## 2017-02-04 DIAGNOSIS — Z86718 Personal history of other venous thrombosis and embolism: Secondary | ICD-10-CM | POA: Diagnosis not present

## 2017-02-04 DIAGNOSIS — Z801 Family history of malignant neoplasm of trachea, bronchus and lung: Secondary | ICD-10-CM | POA: Diagnosis not present

## 2017-02-04 DIAGNOSIS — C785 Secondary malignant neoplasm of large intestine and rectum: Secondary | ICD-10-CM | POA: Diagnosis not present

## 2017-02-04 DIAGNOSIS — R918 Other nonspecific abnormal finding of lung field: Secondary | ICD-10-CM | POA: Diagnosis not present

## 2017-02-04 DIAGNOSIS — C569 Malignant neoplasm of unspecified ovary: Secondary | ICD-10-CM | POA: Diagnosis not present

## 2017-02-04 DIAGNOSIS — C7982 Secondary malignant neoplasm of genital organs: Secondary | ICD-10-CM | POA: Diagnosis not present

## 2017-02-04 DIAGNOSIS — Z90710 Acquired absence of both cervix and uterus: Secondary | ICD-10-CM | POA: Diagnosis not present

## 2017-02-04 DIAGNOSIS — Z888 Allergy status to other drugs, medicaments and biological substances status: Secondary | ICD-10-CM | POA: Diagnosis not present

## 2017-02-04 DIAGNOSIS — D1803 Hemangioma of intra-abdominal structures: Secondary | ICD-10-CM | POA: Diagnosis not present

## 2017-02-04 DIAGNOSIS — R42 Dizziness and giddiness: Secondary | ICD-10-CM | POA: Diagnosis not present

## 2017-02-04 DIAGNOSIS — F419 Anxiety disorder, unspecified: Secondary | ICD-10-CM | POA: Diagnosis not present

## 2017-02-04 DIAGNOSIS — E785 Hyperlipidemia, unspecified: Secondary | ICD-10-CM | POA: Diagnosis not present

## 2017-02-04 DIAGNOSIS — C562 Malignant neoplasm of left ovary: Secondary | ICD-10-CM | POA: Diagnosis not present

## 2017-02-04 DIAGNOSIS — C787 Secondary malignant neoplasm of liver and intrahepatic bile duct: Secondary | ICD-10-CM | POA: Diagnosis not present

## 2017-02-04 DIAGNOSIS — R5383 Other fatigue: Secondary | ICD-10-CM | POA: Diagnosis not present

## 2017-02-04 DIAGNOSIS — Z803 Family history of malignant neoplasm of breast: Secondary | ICD-10-CM | POA: Diagnosis not present

## 2017-02-04 DIAGNOSIS — Z9221 Personal history of antineoplastic chemotherapy: Secondary | ICD-10-CM | POA: Diagnosis not present

## 2017-02-04 DIAGNOSIS — Z79899 Other long term (current) drug therapy: Secondary | ICD-10-CM | POA: Diagnosis not present

## 2017-02-04 DIAGNOSIS — K7689 Other specified diseases of liver: Secondary | ICD-10-CM | POA: Diagnosis not present

## 2017-02-04 DIAGNOSIS — Z90722 Acquired absence of ovaries, bilateral: Secondary | ICD-10-CM | POA: Diagnosis not present

## 2017-02-05 ENCOUNTER — Other Ambulatory Visit: Payer: Self-pay

## 2017-02-05 DIAGNOSIS — G4709 Other insomnia: Secondary | ICD-10-CM

## 2017-02-05 MED ORDER — ZOLPIDEM TARTRATE 10 MG PO TABS: 10.0000 mg | ORAL_TABLET | Freq: Every evening | ORAL | 1 refills | Status: DC | PRN

## 2017-02-05 NOTE — Telephone Encounter (Signed)
Please call in zolpidem  

## 2017-02-05 NOTE — Telephone Encounter (Signed)
Refill request from [facility] for Ambien. Please review-aa

## 2017-02-06 NOTE — Telephone Encounter (Signed)
RX called in-aa 

## 2017-02-13 DIAGNOSIS — M7062 Trochanteric bursitis, left hip: Secondary | ICD-10-CM | POA: Diagnosis not present

## 2017-02-22 DIAGNOSIS — C569 Malignant neoplasm of unspecified ovary: Secondary | ICD-10-CM | POA: Diagnosis not present

## 2017-02-25 DIAGNOSIS — Z803 Family history of malignant neoplasm of breast: Secondary | ICD-10-CM | POA: Diagnosis not present

## 2017-02-25 DIAGNOSIS — Z6827 Body mass index (BMI) 27.0-27.9, adult: Secondary | ICD-10-CM | POA: Diagnosis not present

## 2017-02-25 DIAGNOSIS — Z8041 Family history of malignant neoplasm of ovary: Secondary | ICD-10-CM | POA: Diagnosis not present

## 2017-02-25 DIAGNOSIS — R918 Other nonspecific abnormal finding of lung field: Secondary | ICD-10-CM | POA: Diagnosis not present

## 2017-02-25 DIAGNOSIS — Z1502 Genetic susceptibility to malignant neoplasm of ovary: Secondary | ICD-10-CM | POA: Diagnosis not present

## 2017-02-25 DIAGNOSIS — Z808 Family history of malignant neoplasm of other organs or systems: Secondary | ICD-10-CM | POA: Diagnosis not present

## 2017-02-25 DIAGNOSIS — E785 Hyperlipidemia, unspecified: Secondary | ICD-10-CM | POA: Diagnosis not present

## 2017-02-25 DIAGNOSIS — Z801 Family history of malignant neoplasm of trachea, bronchus and lung: Secondary | ICD-10-CM | POA: Diagnosis not present

## 2017-02-25 DIAGNOSIS — G629 Polyneuropathy, unspecified: Secondary | ICD-10-CM | POA: Diagnosis not present

## 2017-02-25 DIAGNOSIS — Z79899 Other long term (current) drug therapy: Secondary | ICD-10-CM | POA: Diagnosis not present

## 2017-02-25 DIAGNOSIS — Z9221 Personal history of antineoplastic chemotherapy: Secondary | ICD-10-CM | POA: Diagnosis not present

## 2017-02-25 DIAGNOSIS — C787 Secondary malignant neoplasm of liver and intrahepatic bile duct: Secondary | ICD-10-CM | POA: Diagnosis not present

## 2017-02-25 DIAGNOSIS — C569 Malignant neoplasm of unspecified ovary: Secondary | ICD-10-CM | POA: Diagnosis not present

## 2017-02-25 DIAGNOSIS — Z90710 Acquired absence of both cervix and uterus: Secondary | ICD-10-CM | POA: Diagnosis not present

## 2017-02-25 DIAGNOSIS — Z1501 Genetic susceptibility to malignant neoplasm of breast: Secondary | ICD-10-CM | POA: Diagnosis not present

## 2017-02-25 DIAGNOSIS — K769 Liver disease, unspecified: Secondary | ICD-10-CM | POA: Diagnosis not present

## 2017-03-20 DIAGNOSIS — H5203 Hypermetropia, bilateral: Secondary | ICD-10-CM | POA: Diagnosis not present

## 2017-03-20 DIAGNOSIS — H524 Presbyopia: Secondary | ICD-10-CM | POA: Diagnosis not present

## 2017-03-20 DIAGNOSIS — H2513 Age-related nuclear cataract, bilateral: Secondary | ICD-10-CM | POA: Diagnosis not present

## 2017-03-20 DIAGNOSIS — H52223 Regular astigmatism, bilateral: Secondary | ICD-10-CM | POA: Diagnosis not present

## 2017-03-22 DIAGNOSIS — C569 Malignant neoplasm of unspecified ovary: Secondary | ICD-10-CM | POA: Diagnosis not present

## 2017-03-25 DIAGNOSIS — M549 Dorsalgia, unspecified: Secondary | ICD-10-CM | POA: Diagnosis not present

## 2017-03-25 DIAGNOSIS — Z808 Family history of malignant neoplasm of other organs or systems: Secondary | ICD-10-CM | POA: Diagnosis not present

## 2017-03-25 DIAGNOSIS — F419 Anxiety disorder, unspecified: Secondary | ICD-10-CM | POA: Diagnosis not present

## 2017-03-25 DIAGNOSIS — R918 Other nonspecific abnormal finding of lung field: Secondary | ICD-10-CM | POA: Diagnosis not present

## 2017-03-25 DIAGNOSIS — Z6827 Body mass index (BMI) 27.0-27.9, adult: Secondary | ICD-10-CM | POA: Diagnosis not present

## 2017-03-25 DIAGNOSIS — C562 Malignant neoplasm of left ovary: Secondary | ICD-10-CM | POA: Diagnosis not present

## 2017-03-25 DIAGNOSIS — C569 Malignant neoplasm of unspecified ovary: Secondary | ICD-10-CM | POA: Diagnosis not present

## 2017-03-25 DIAGNOSIS — Z888 Allergy status to other drugs, medicaments and biological substances status: Secondary | ICD-10-CM | POA: Diagnosis not present

## 2017-03-25 DIAGNOSIS — C787 Secondary malignant neoplasm of liver and intrahepatic bile duct: Secondary | ICD-10-CM | POA: Diagnosis not present

## 2017-03-25 DIAGNOSIS — Z8041 Family history of malignant neoplasm of ovary: Secondary | ICD-10-CM | POA: Diagnosis not present

## 2017-03-25 DIAGNOSIS — R079 Chest pain, unspecified: Secondary | ICD-10-CM | POA: Diagnosis not present

## 2017-03-25 DIAGNOSIS — Z8542 Personal history of malignant neoplasm of other parts of uterus: Secondary | ICD-10-CM | POA: Diagnosis not present

## 2017-03-25 DIAGNOSIS — Z09 Encounter for follow-up examination after completed treatment for conditions other than malignant neoplasm: Secondary | ICD-10-CM | POA: Diagnosis not present

## 2017-03-25 DIAGNOSIS — Z90710 Acquired absence of both cervix and uterus: Secondary | ICD-10-CM | POA: Diagnosis not present

## 2017-03-25 DIAGNOSIS — Z801 Family history of malignant neoplasm of trachea, bronchus and lung: Secondary | ICD-10-CM | POA: Diagnosis not present

## 2017-03-25 DIAGNOSIS — E785 Hyperlipidemia, unspecified: Secondary | ICD-10-CM | POA: Diagnosis not present

## 2017-03-25 DIAGNOSIS — R0602 Shortness of breath: Secondary | ICD-10-CM | POA: Diagnosis not present

## 2017-03-25 DIAGNOSIS — Z803 Family history of malignant neoplasm of breast: Secondary | ICD-10-CM | POA: Diagnosis not present

## 2017-03-25 DIAGNOSIS — Z9221 Personal history of antineoplastic chemotherapy: Secondary | ICD-10-CM | POA: Diagnosis not present

## 2017-03-25 DIAGNOSIS — M25559 Pain in unspecified hip: Secondary | ICD-10-CM | POA: Diagnosis not present

## 2017-03-25 DIAGNOSIS — Z90722 Acquired absence of ovaries, bilateral: Secondary | ICD-10-CM | POA: Diagnosis not present

## 2017-03-25 DIAGNOSIS — Z79899 Other long term (current) drug therapy: Secondary | ICD-10-CM | POA: Diagnosis not present

## 2017-03-25 DIAGNOSIS — Z86718 Personal history of other venous thrombosis and embolism: Secondary | ICD-10-CM | POA: Diagnosis not present

## 2017-03-25 DIAGNOSIS — Z7982 Long term (current) use of aspirin: Secondary | ICD-10-CM | POA: Diagnosis not present

## 2017-04-01 DIAGNOSIS — M545 Low back pain: Secondary | ICD-10-CM | POA: Diagnosis not present

## 2017-04-03 DIAGNOSIS — C562 Malignant neoplasm of left ovary: Secondary | ICD-10-CM | POA: Diagnosis not present

## 2017-04-03 DIAGNOSIS — R933 Abnormal findings on diagnostic imaging of other parts of digestive tract: Secondary | ICD-10-CM | POA: Diagnosis not present

## 2017-04-03 DIAGNOSIS — R222 Localized swelling, mass and lump, trunk: Secondary | ICD-10-CM | POA: Diagnosis not present

## 2017-04-03 DIAGNOSIS — R1900 Intra-abdominal and pelvic swelling, mass and lump, unspecified site: Secondary | ICD-10-CM | POA: Diagnosis not present

## 2017-04-19 DIAGNOSIS — C569 Malignant neoplasm of unspecified ovary: Secondary | ICD-10-CM | POA: Diagnosis not present

## 2017-04-22 DIAGNOSIS — Z6827 Body mass index (BMI) 27.0-27.9, adult: Secondary | ICD-10-CM | POA: Diagnosis not present

## 2017-04-22 DIAGNOSIS — C569 Malignant neoplasm of unspecified ovary: Secondary | ICD-10-CM | POA: Diagnosis not present

## 2017-04-22 DIAGNOSIS — G4700 Insomnia, unspecified: Secondary | ICD-10-CM | POA: Diagnosis not present

## 2017-04-22 DIAGNOSIS — Z9221 Personal history of antineoplastic chemotherapy: Secondary | ICD-10-CM | POA: Diagnosis not present

## 2017-04-22 DIAGNOSIS — J302 Other seasonal allergic rhinitis: Secondary | ICD-10-CM | POA: Diagnosis not present

## 2017-04-22 DIAGNOSIS — Z90722 Acquired absence of ovaries, bilateral: Secondary | ICD-10-CM | POA: Diagnosis not present

## 2017-04-22 DIAGNOSIS — Z1501 Genetic susceptibility to malignant neoplasm of breast: Secondary | ICD-10-CM | POA: Diagnosis not present

## 2017-04-22 DIAGNOSIS — M549 Dorsalgia, unspecified: Secondary | ICD-10-CM | POA: Diagnosis not present

## 2017-04-22 DIAGNOSIS — M898X8 Other specified disorders of bone, other site: Secondary | ICD-10-CM | POA: Diagnosis not present

## 2017-04-22 DIAGNOSIS — Z1502 Genetic susceptibility to malignant neoplasm of ovary: Secondary | ICD-10-CM | POA: Diagnosis not present

## 2017-04-22 DIAGNOSIS — M7072 Other bursitis of hip, left hip: Secondary | ICD-10-CM | POA: Diagnosis not present

## 2017-04-22 DIAGNOSIS — R1907 Generalized intra-abdominal and pelvic swelling, mass and lump: Secondary | ICD-10-CM | POA: Diagnosis not present

## 2017-04-22 DIAGNOSIS — Z8541 Personal history of malignant neoplasm of cervix uteri: Secondary | ICD-10-CM | POA: Diagnosis not present

## 2017-04-22 DIAGNOSIS — Z8542 Personal history of malignant neoplasm of other parts of uterus: Secondary | ICD-10-CM | POA: Diagnosis not present

## 2017-04-22 DIAGNOSIS — Z791 Long term (current) use of non-steroidal anti-inflammatories (NSAID): Secondary | ICD-10-CM | POA: Diagnosis not present

## 2017-04-22 DIAGNOSIS — C561 Malignant neoplasm of right ovary: Secondary | ICD-10-CM | POA: Diagnosis not present

## 2017-04-22 DIAGNOSIS — C787 Secondary malignant neoplasm of liver and intrahepatic bile duct: Secondary | ICD-10-CM | POA: Diagnosis not present

## 2017-04-22 DIAGNOSIS — R918 Other nonspecific abnormal finding of lung field: Secondary | ICD-10-CM | POA: Diagnosis not present

## 2017-04-22 DIAGNOSIS — Z90710 Acquired absence of both cervix and uterus: Secondary | ICD-10-CM | POA: Diagnosis not present

## 2017-04-22 DIAGNOSIS — Z1509 Genetic susceptibility to other malignant neoplasm: Secondary | ICD-10-CM | POA: Diagnosis not present

## 2017-04-22 DIAGNOSIS — C562 Malignant neoplasm of left ovary: Secondary | ICD-10-CM | POA: Diagnosis not present

## 2017-04-22 DIAGNOSIS — K869 Disease of pancreas, unspecified: Secondary | ICD-10-CM | POA: Diagnosis not present

## 2017-04-22 DIAGNOSIS — Z7982 Long term (current) use of aspirin: Secondary | ICD-10-CM | POA: Diagnosis not present

## 2017-04-22 DIAGNOSIS — Z86718 Personal history of other venous thrombosis and embolism: Secondary | ICD-10-CM | POA: Diagnosis not present

## 2017-04-22 DIAGNOSIS — Z8543 Personal history of malignant neoplasm of ovary: Secondary | ICD-10-CM | POA: Diagnosis not present

## 2017-04-22 DIAGNOSIS — Z006 Encounter for examination for normal comparison and control in clinical research program: Secondary | ICD-10-CM | POA: Diagnosis not present

## 2017-04-22 DIAGNOSIS — F419 Anxiety disorder, unspecified: Secondary | ICD-10-CM | POA: Diagnosis not present

## 2017-04-26 ENCOUNTER — Other Ambulatory Visit: Payer: Self-pay | Admitting: Family Medicine

## 2017-04-26 NOTE — Telephone Encounter (Signed)
[facility] faxed a request on the following medication. Thanks CC  ALPRAZolam (XANAX) 0.5 MG tablet  *Take 1 tablet by mouth every 8 hours as needed.

## 2017-04-28 MED ORDER — ALPRAZOLAM 0.5 MG PO TABS: 0.5000 mg | ORAL_TABLET | Freq: Three times a day (TID) | ORAL | 1 refills | Status: DC | PRN

## 2017-05-17 DIAGNOSIS — C569 Malignant neoplasm of unspecified ovary: Secondary | ICD-10-CM | POA: Diagnosis not present

## 2017-05-20 DIAGNOSIS — Z7982 Long term (current) use of aspirin: Secondary | ICD-10-CM | POA: Diagnosis not present

## 2017-05-20 DIAGNOSIS — R918 Other nonspecific abnormal finding of lung field: Secondary | ICD-10-CM | POA: Diagnosis not present

## 2017-05-20 DIAGNOSIS — M7072 Other bursitis of hip, left hip: Secondary | ICD-10-CM | POA: Diagnosis not present

## 2017-05-20 DIAGNOSIS — Z79899 Other long term (current) drug therapy: Secondary | ICD-10-CM | POA: Diagnosis not present

## 2017-05-20 DIAGNOSIS — Z1501 Genetic susceptibility to malignant neoplasm of breast: Secondary | ICD-10-CM | POA: Diagnosis not present

## 2017-05-20 DIAGNOSIS — G4700 Insomnia, unspecified: Secondary | ICD-10-CM | POA: Diagnosis not present

## 2017-05-20 DIAGNOSIS — Z8041 Family history of malignant neoplasm of ovary: Secondary | ICD-10-CM | POA: Diagnosis not present

## 2017-05-20 DIAGNOSIS — Z801 Family history of malignant neoplasm of trachea, bronchus and lung: Secondary | ICD-10-CM | POA: Diagnosis not present

## 2017-05-20 DIAGNOSIS — F419 Anxiety disorder, unspecified: Secondary | ICD-10-CM | POA: Diagnosis not present

## 2017-05-20 DIAGNOSIS — Z1509 Genetic susceptibility to other malignant neoplasm: Secondary | ICD-10-CM | POA: Diagnosis not present

## 2017-05-20 DIAGNOSIS — C569 Malignant neoplasm of unspecified ovary: Secondary | ICD-10-CM | POA: Diagnosis not present

## 2017-05-20 DIAGNOSIS — Z6827 Body mass index (BMI) 27.0-27.9, adult: Secondary | ICD-10-CM | POA: Diagnosis not present

## 2017-05-20 DIAGNOSIS — Z90722 Acquired absence of ovaries, bilateral: Secondary | ICD-10-CM | POA: Diagnosis not present

## 2017-05-20 DIAGNOSIS — M549 Dorsalgia, unspecified: Secondary | ICD-10-CM | POA: Diagnosis not present

## 2017-05-20 DIAGNOSIS — Z86718 Personal history of other venous thrombosis and embolism: Secondary | ICD-10-CM | POA: Diagnosis not present

## 2017-05-20 DIAGNOSIS — Z90710 Acquired absence of both cervix and uterus: Secondary | ICD-10-CM | POA: Diagnosis not present

## 2017-05-20 DIAGNOSIS — Z006 Encounter for examination for normal comparison and control in clinical research program: Secondary | ICD-10-CM | POA: Diagnosis not present

## 2017-05-20 DIAGNOSIS — R5383 Other fatigue: Secondary | ICD-10-CM | POA: Diagnosis not present

## 2017-05-20 DIAGNOSIS — Z888 Allergy status to other drugs, medicaments and biological substances status: Secondary | ICD-10-CM | POA: Diagnosis not present

## 2017-05-20 DIAGNOSIS — Z9221 Personal history of antineoplastic chemotherapy: Secondary | ICD-10-CM | POA: Diagnosis not present

## 2017-05-20 DIAGNOSIS — Z803 Family history of malignant neoplasm of breast: Secondary | ICD-10-CM | POA: Diagnosis not present

## 2017-06-04 DIAGNOSIS — R590 Localized enlarged lymph nodes: Secondary | ICD-10-CM | POA: Diagnosis not present

## 2017-06-04 DIAGNOSIS — K769 Liver disease, unspecified: Secondary | ICD-10-CM | POA: Diagnosis not present

## 2017-06-04 DIAGNOSIS — C562 Malignant neoplasm of left ovary: Secondary | ICD-10-CM | POA: Diagnosis not present

## 2017-06-04 DIAGNOSIS — R932 Abnormal findings on diagnostic imaging of liver and biliary tract: Secondary | ICD-10-CM | POA: Diagnosis not present

## 2017-06-04 DIAGNOSIS — R918 Other nonspecific abnormal finding of lung field: Secondary | ICD-10-CM | POA: Diagnosis not present

## 2017-06-18 DIAGNOSIS — M25552 Pain in left hip: Secondary | ICD-10-CM | POA: Diagnosis not present

## 2017-06-18 DIAGNOSIS — M545 Low back pain: Secondary | ICD-10-CM | POA: Diagnosis not present

## 2017-06-21 ENCOUNTER — Other Ambulatory Visit: Payer: Self-pay | Admitting: Family Medicine

## 2017-06-21 MED ORDER — MONTELUKAST SODIUM 10 MG PO TABS: 10.0000 mg | ORAL_TABLET | Freq: Every day | ORAL | 3 refills | Status: DC

## 2017-06-21 NOTE — Telephone Encounter (Signed)
[facility] faxed a request on the following medication. Thanks CC  montelukast (SINGULAIR) 10 MG tablet  >Take one tablet every day.

## 2017-06-24 ENCOUNTER — Ambulatory Visit: Payer: Medicare Other | Admitting: Family Medicine

## 2017-06-24 ENCOUNTER — Encounter: Payer: Self-pay | Admitting: Family Medicine

## 2017-06-24 ENCOUNTER — Ambulatory Visit (INDEPENDENT_AMBULATORY_CARE_PROVIDER_SITE_OTHER): Payer: Medicare Other | Admitting: Family Medicine

## 2017-06-24 VITALS — BP 100/68 | HR 76 | Temp 97.8°F | Resp 16 | Wt 180.0 lb

## 2017-06-24 DIAGNOSIS — C799 Secondary malignant neoplasm of unspecified site: Secondary | ICD-10-CM | POA: Diagnosis not present

## 2017-06-24 DIAGNOSIS — C569 Malignant neoplasm of unspecified ovary: Secondary | ICD-10-CM

## 2017-06-24 DIAGNOSIS — F419 Anxiety disorder, unspecified: Secondary | ICD-10-CM | POA: Diagnosis not present

## 2017-06-24 DIAGNOSIS — J309 Allergic rhinitis, unspecified: Secondary | ICD-10-CM

## 2017-06-24 DIAGNOSIS — M1712 Unilateral primary osteoarthritis, left knee: Secondary | ICD-10-CM

## 2017-06-24 NOTE — Progress Notes (Signed)
Patient: Nancy Downs Female    DOB: 1936-01-30   81 y.o.   MRN: [id number] Visit Date: 06/24/2017  Today's Provider: Wilhemena Durie, MD   Chief Complaint  Patient presents with  . Ovarian Cancer   Subjective:    HPI   Ovarian Cancer Nancy Downs is here for a 6 month follow up. She was in a cancer study, but a recent MRI (06/04/2017) showed that the cancer has returned. She was taken out of the study, and the Olaparib was discontinued. She will restart chemo on July 30th. She states she is handling the news well. She is also c/o left hip pain. She has seen ortho for this, and has imaging scheduled tomorrow on this hip.  Allergies  Allergen Reactions  . Sodium Tetradecyl Sulfate Shortness Of Breath    SOB, coughing, rash neck/chest      Current Outpatient Prescriptions:  .  ALPRAZolam (XANAX) 0.5 MG tablet, Take 1 tablet (0.5 mg total) by mouth every 8 (eight) hours as needed., Disp: 180 tablet, Rfl: 1 .  aspirin EC 325 MG tablet, 1 tab a day for the next 30 days to prevent blood clots, Disp: 30 tablet, Rfl: 0 .  calcium carbonate (OS-CAL) 600 MG TABS tablet, Take 600 mg by mouth 2 (two) times daily with a meal., Disp: , Rfl:  .  celecoxib (CELEBREX) 200 MG capsule, Take 1 capsule (200 mg total) by mouth daily., Disp: 30 capsule, Rfl: 3 .  citalopram (CELEXA) 20 MG tablet, Take 1 tablet (20 mg total) by mouth every morning., Disp: 90 tablet, Rfl: 3 .  HYDROcodone-acetaminophen (NORCO) 10-325 MG tablet, Take 0.5 tablets by mouth 2 (two) times daily as needed. , Disp: , Rfl: 0 .  loratadine (CLARITIN) 10 MG tablet, Take 10 mg by mouth daily., Disp: , Rfl:  .  montelukast (SINGULAIR) 10 MG tablet, Take 1 tablet (10 mg total) by mouth daily., Disp: 90 tablet, Rfl: 3 .  rosuvastatin (CRESTOR) 10 MG tablet, Take 1 tablet (10 mg total) by mouth daily., Disp: 30 tablet, Rfl: 12 .  sennosides-docusate sodium (SENOKOT-S) 8.6-50 MG tablet, Take 2 tablets by mouth 2 (two) times daily as  needed for constipation. For Constipation  , Disp: , Rfl:  .  Vitamin D, Ergocalciferol, (DRISDOL) 50000 units CAPS capsule, TAKE 1 CAPSULE BY MOUTH EVERY WEEK ON TUESDAYS, Disp: 4 capsule, Rfl: 11 .  zolpidem (AMBIEN) 10 MG tablet, Take 1 tablet (10 mg total) by mouth at bedtime as needed for sleep., Disp: 90 tablet, Rfl: 1 .  ondansetron (ZOFRAN) 4 MG tablet, Take 4 mg by mouth every 8 (eight) hours as needed for nausea or vomiting., Disp: , Rfl:   Review of Systems  Constitutional: Positive for fatigue. Negative for activity change, appetite change, chills, diaphoresis, fever and unexpected weight change.  Eyes: Negative.   Respiratory: Negative for shortness of breath.   Cardiovascular: Negative for chest pain, palpitations and leg swelling.  Endocrine: Negative.   Musculoskeletal: Positive for arthralgias.  Allergic/Immunologic: Negative.   Neurological: Negative.   Hematological: Negative.   Psychiatric/Behavioral: Negative for dysphoric mood, sleep disturbance (well controlled on Ambien) and suicidal ideas. The patient is not nervous/anxious.     Social History  Substance Use Topics  . Smoking status: Never Smoker  . Smokeless tobacco: Never Used  . Alcohol use Yes     Comment: occasional   Objective:   BP 100/68 (BP Location: Left Arm, Patient Position: Sitting, Cuff Size:  Normal)   Pulse 76   Temp 97.8 F (36.6 C) (Oral)   Resp 16   Wt 180 lb (81.6 kg)   BMI 27.98 kg/m  Vitals:   06/24/17 1455  BP: 100/68  Pulse: 76  Resp: 16  Temp: 97.8 F (36.6 C)  TempSrc: Oral  Weight: 180 lb (81.6 kg)     Physical Exam  Constitutional: She is oriented to person, place, and time. She appears well-developed and well-nourished.  HENT:  Head: Normocephalic and atraumatic.  Right Ear: External ear normal.  Left Ear: External ear normal.  Nose: Nose normal.  Eyes: Conjunctivae are normal. No scleral icterus.  Neck: Normal range of motion. No thyromegaly present.    Cardiovascular: Normal rate, regular rhythm and normal heart sounds.   Pulmonary/Chest: Effort normal and breath sounds normal. No respiratory distress.  Abdominal: Soft. She exhibits mass. She exhibits no distension. There is no tenderness.  Firmness in base of umbilicus.  Musculoskeletal: She exhibits no edema.  Neurological: She is alert and oriented to person, place, and time.  Skin: Skin is dry.  Psychiatric: She has a normal mood and affect. Her behavior is normal. Judgment and thought content normal.        Assessment & Plan:     1. Cancer, metastatic ([place]) FU with oncology as scheduled. She has a good attitude about this, and encouraged her to stay positive.  2. Malignant neoplasm of ovary, unspecified laterality ([place])       Wilhemena Durie, MD  [facility]

## 2017-06-25 DIAGNOSIS — M25559 Pain in unspecified hip: Secondary | ICD-10-CM | POA: Diagnosis not present

## 2017-07-03 DIAGNOSIS — M25552 Pain in left hip: Secondary | ICD-10-CM | POA: Diagnosis not present

## 2017-07-05 DIAGNOSIS — C569 Malignant neoplasm of unspecified ovary: Secondary | ICD-10-CM | POA: Diagnosis not present

## 2017-07-08 DIAGNOSIS — Z8543 Personal history of malignant neoplasm of ovary: Secondary | ICD-10-CM | POA: Diagnosis not present

## 2017-07-08 DIAGNOSIS — C569 Malignant neoplasm of unspecified ovary: Secondary | ICD-10-CM | POA: Diagnosis not present

## 2017-07-08 DIAGNOSIS — Z8041 Family history of malignant neoplasm of ovary: Secondary | ICD-10-CM | POA: Diagnosis not present

## 2017-07-08 DIAGNOSIS — Z90722 Acquired absence of ovaries, bilateral: Secondary | ICD-10-CM | POA: Diagnosis not present

## 2017-07-08 DIAGNOSIS — F419 Anxiety disorder, unspecified: Secondary | ICD-10-CM | POA: Diagnosis not present

## 2017-07-08 DIAGNOSIS — Z79899 Other long term (current) drug therapy: Secondary | ICD-10-CM | POA: Diagnosis not present

## 2017-07-08 DIAGNOSIS — Z86718 Personal history of other venous thrombosis and embolism: Secondary | ICD-10-CM | POA: Diagnosis not present

## 2017-07-08 DIAGNOSIS — C787 Secondary malignant neoplasm of liver and intrahepatic bile duct: Secondary | ICD-10-CM | POA: Diagnosis not present

## 2017-07-08 DIAGNOSIS — Z803 Family history of malignant neoplasm of breast: Secondary | ICD-10-CM | POA: Diagnosis not present

## 2017-07-08 DIAGNOSIS — Z5111 Encounter for antineoplastic chemotherapy: Secondary | ICD-10-CM | POA: Diagnosis not present

## 2017-07-08 DIAGNOSIS — G4700 Insomnia, unspecified: Secondary | ICD-10-CM | POA: Diagnosis not present

## 2017-07-11 DIAGNOSIS — Z5111 Encounter for antineoplastic chemotherapy: Secondary | ICD-10-CM | POA: Diagnosis not present

## 2017-07-11 DIAGNOSIS — C569 Malignant neoplasm of unspecified ovary: Secondary | ICD-10-CM | POA: Diagnosis not present

## 2017-07-18 DIAGNOSIS — Z5111 Encounter for antineoplastic chemotherapy: Secondary | ICD-10-CM | POA: Diagnosis not present

## 2017-07-18 DIAGNOSIS — C569 Malignant neoplasm of unspecified ovary: Secondary | ICD-10-CM | POA: Diagnosis not present

## 2017-07-25 ENCOUNTER — Other Ambulatory Visit: Payer: Self-pay | Admitting: Family Medicine

## 2017-07-25 DIAGNOSIS — C569 Malignant neoplasm of unspecified ovary: Secondary | ICD-10-CM | POA: Diagnosis not present

## 2017-07-25 DIAGNOSIS — Z5111 Encounter for antineoplastic chemotherapy: Secondary | ICD-10-CM | POA: Diagnosis not present

## 2017-07-25 MED ORDER — CITALOPRAM HYDROBROMIDE 20 MG PO TABS: 20.0000 mg | ORAL_TABLET | ORAL | 3 refills | Status: DC

## 2017-07-25 NOTE — Telephone Encounter (Signed)
[facility] faxed a refill request on the following medications:  citalopram (CELEXA) 20 MG tablet.  Take one tablet by mouth every morning.  90 day supply   [facility]

## 2017-07-25 NOTE — Telephone Encounter (Signed)
Done-aa 

## 2017-07-29 DIAGNOSIS — C569 Malignant neoplasm of unspecified ovary: Secondary | ICD-10-CM | POA: Diagnosis not present

## 2017-07-29 DIAGNOSIS — Z5111 Encounter for antineoplastic chemotherapy: Secondary | ICD-10-CM | POA: Diagnosis not present

## 2017-08-01 DIAGNOSIS — M545 Low back pain: Secondary | ICD-10-CM | POA: Diagnosis not present

## 2017-08-01 DIAGNOSIS — C569 Malignant neoplasm of unspecified ovary: Secondary | ICD-10-CM | POA: Diagnosis not present

## 2017-08-01 DIAGNOSIS — M47816 Spondylosis without myelopathy or radiculopathy, lumbar region: Secondary | ICD-10-CM | POA: Diagnosis not present

## 2017-08-07 DIAGNOSIS — C569 Malignant neoplasm of unspecified ovary: Secondary | ICD-10-CM | POA: Diagnosis not present

## 2017-08-07 DIAGNOSIS — Z79899 Other long term (current) drug therapy: Secondary | ICD-10-CM | POA: Diagnosis not present

## 2017-08-13 ENCOUNTER — Other Ambulatory Visit: Payer: Self-pay | Admitting: Family Medicine

## 2017-08-13 DIAGNOSIS — G4709 Other insomnia: Secondary | ICD-10-CM

## 2017-08-13 NOTE — Telephone Encounter (Signed)
[facility] faxed a request on the following medication. Thanks CC  zolpidem (AMBIEN) 10 MG tablet  >Take one tablet by mouth at bedtime as needed

## 2017-08-14 MED ORDER — ZOLPIDEM TARTRATE 10 MG PO TABS: 10.0000 mg | ORAL_TABLET | Freq: Every evening | ORAL | 1 refills | Status: DC | PRN

## 2017-08-15 DIAGNOSIS — C569 Malignant neoplasm of unspecified ovary: Secondary | ICD-10-CM | POA: Diagnosis not present

## 2017-08-15 DIAGNOSIS — Z5111 Encounter for antineoplastic chemotherapy: Secondary | ICD-10-CM | POA: Diagnosis not present

## 2017-08-17 DIAGNOSIS — H5203 Hypermetropia, bilateral: Secondary | ICD-10-CM | POA: Diagnosis not present

## 2017-08-17 DIAGNOSIS — H52223 Regular astigmatism, bilateral: Secondary | ICD-10-CM | POA: Diagnosis not present

## 2017-08-17 DIAGNOSIS — H524 Presbyopia: Secondary | ICD-10-CM | POA: Diagnosis not present

## 2017-08-17 DIAGNOSIS — H1131 Conjunctival hemorrhage, right eye: Secondary | ICD-10-CM | POA: Diagnosis not present

## 2017-08-17 DIAGNOSIS — H2513 Age-related nuclear cataract, bilateral: Secondary | ICD-10-CM | POA: Diagnosis not present

## 2017-08-19 ENCOUNTER — Other Ambulatory Visit: Payer: Self-pay | Admitting: Family Medicine

## 2017-08-19 ENCOUNTER — Ambulatory Visit: Payer: Medicare Other | Admitting: Family Medicine

## 2017-08-19 DIAGNOSIS — C569 Malignant neoplasm of unspecified ovary: Secondary | ICD-10-CM | POA: Diagnosis not present

## 2017-08-19 NOTE — Telephone Encounter (Signed)
RX called in-aa 

## 2017-08-19 NOTE — Telephone Encounter (Signed)
[facility] faxed a refill request on the following medications:  zolpidem (AMBIEN) 10 MG tablet.  Rake 1 tablet by mouth at bedtime as needed.  90 day supply.  [facility]

## 2017-08-20 DIAGNOSIS — H1131 Conjunctival hemorrhage, right eye: Secondary | ICD-10-CM | POA: Diagnosis not present

## 2017-08-22 DIAGNOSIS — C569 Malignant neoplasm of unspecified ovary: Secondary | ICD-10-CM | POA: Diagnosis not present

## 2017-08-22 DIAGNOSIS — Z5111 Encounter for antineoplastic chemotherapy: Secondary | ICD-10-CM | POA: Diagnosis not present

## 2017-08-28 DIAGNOSIS — M545 Low back pain: Secondary | ICD-10-CM | POA: Diagnosis not present

## 2017-08-28 DIAGNOSIS — M461 Sacroiliitis, not elsewhere classified: Secondary | ICD-10-CM | POA: Diagnosis not present

## 2017-08-29 DIAGNOSIS — C569 Malignant neoplasm of unspecified ovary: Secondary | ICD-10-CM | POA: Diagnosis not present

## 2017-09-02 DIAGNOSIS — Z5111 Encounter for antineoplastic chemotherapy: Secondary | ICD-10-CM | POA: Diagnosis not present

## 2017-09-02 DIAGNOSIS — C569 Malignant neoplasm of unspecified ovary: Secondary | ICD-10-CM | POA: Diagnosis not present

## 2017-09-05 DIAGNOSIS — M461 Sacroiliitis, not elsewhere classified: Secondary | ICD-10-CM | POA: Diagnosis not present

## 2017-09-06 DIAGNOSIS — C569 Malignant neoplasm of unspecified ovary: Secondary | ICD-10-CM | POA: Diagnosis not present

## 2017-09-06 DIAGNOSIS — Z5111 Encounter for antineoplastic chemotherapy: Secondary | ICD-10-CM | POA: Diagnosis not present

## 2017-09-12 DIAGNOSIS — Z5111 Encounter for antineoplastic chemotherapy: Secondary | ICD-10-CM | POA: Diagnosis not present

## 2017-09-12 DIAGNOSIS — C569 Malignant neoplasm of unspecified ovary: Secondary | ICD-10-CM | POA: Diagnosis not present

## 2017-09-17 DIAGNOSIS — K769 Liver disease, unspecified: Secondary | ICD-10-CM | POA: Diagnosis not present

## 2017-09-17 DIAGNOSIS — C569 Malignant neoplasm of unspecified ovary: Secondary | ICD-10-CM | POA: Diagnosis not present

## 2017-09-17 DIAGNOSIS — K869 Disease of pancreas, unspecified: Secondary | ICD-10-CM | POA: Diagnosis not present

## 2017-09-19 DIAGNOSIS — Z5111 Encounter for antineoplastic chemotherapy: Secondary | ICD-10-CM | POA: Diagnosis not present

## 2017-09-19 DIAGNOSIS — C569 Malignant neoplasm of unspecified ovary: Secondary | ICD-10-CM | POA: Diagnosis not present

## 2017-09-23 DIAGNOSIS — Z1501 Genetic susceptibility to malignant neoplasm of breast: Secondary | ICD-10-CM | POA: Diagnosis not present

## 2017-09-23 DIAGNOSIS — Z791 Long term (current) use of non-steroidal anti-inflammatories (NSAID): Secondary | ICD-10-CM | POA: Diagnosis not present

## 2017-09-23 DIAGNOSIS — E785 Hyperlipidemia, unspecified: Secondary | ICD-10-CM | POA: Diagnosis not present

## 2017-09-23 DIAGNOSIS — Z90710 Acquired absence of both cervix and uterus: Secondary | ICD-10-CM | POA: Diagnosis not present

## 2017-09-23 DIAGNOSIS — Z888 Allergy status to other drugs, medicaments and biological substances status: Secondary | ICD-10-CM | POA: Diagnosis not present

## 2017-09-23 DIAGNOSIS — M549 Dorsalgia, unspecified: Secondary | ICD-10-CM | POA: Diagnosis not present

## 2017-09-23 DIAGNOSIS — G4700 Insomnia, unspecified: Secondary | ICD-10-CM | POA: Diagnosis not present

## 2017-09-23 DIAGNOSIS — F419 Anxiety disorder, unspecified: Secondary | ICD-10-CM | POA: Diagnosis not present

## 2017-09-23 DIAGNOSIS — Z1509 Genetic susceptibility to other malignant neoplasm: Secondary | ICD-10-CM | POA: Diagnosis not present

## 2017-09-23 DIAGNOSIS — C569 Malignant neoplasm of unspecified ovary: Secondary | ICD-10-CM | POA: Diagnosis not present

## 2017-09-23 DIAGNOSIS — Z803 Family history of malignant neoplasm of breast: Secondary | ICD-10-CM | POA: Diagnosis not present

## 2017-09-23 DIAGNOSIS — Z6827 Body mass index (BMI) 27.0-27.9, adult: Secondary | ICD-10-CM | POA: Diagnosis not present

## 2017-09-23 DIAGNOSIS — Z86718 Personal history of other venous thrombosis and embolism: Secondary | ICD-10-CM | POA: Diagnosis not present

## 2017-09-23 DIAGNOSIS — Z8041 Family history of malignant neoplasm of ovary: Secondary | ICD-10-CM | POA: Diagnosis not present

## 2017-09-23 DIAGNOSIS — Z90722 Acquired absence of ovaries, bilateral: Secondary | ICD-10-CM | POA: Diagnosis not present

## 2017-09-23 DIAGNOSIS — C787 Secondary malignant neoplasm of liver and intrahepatic bile duct: Secondary | ICD-10-CM | POA: Diagnosis not present

## 2017-09-23 DIAGNOSIS — M7072 Other bursitis of hip, left hip: Secondary | ICD-10-CM | POA: Diagnosis not present

## 2017-09-23 DIAGNOSIS — Z5111 Encounter for antineoplastic chemotherapy: Secondary | ICD-10-CM | POA: Diagnosis not present

## 2017-09-26 DIAGNOSIS — Z5111 Encounter for antineoplastic chemotherapy: Secondary | ICD-10-CM | POA: Diagnosis not present

## 2017-09-26 DIAGNOSIS — C569 Malignant neoplasm of unspecified ovary: Secondary | ICD-10-CM | POA: Diagnosis not present

## 2017-10-03 DIAGNOSIS — Z5111 Encounter for antineoplastic chemotherapy: Secondary | ICD-10-CM | POA: Diagnosis not present

## 2017-10-03 DIAGNOSIS — C569 Malignant neoplasm of unspecified ovary: Secondary | ICD-10-CM | POA: Diagnosis not present

## 2017-10-10 DIAGNOSIS — Z5111 Encounter for antineoplastic chemotherapy: Secondary | ICD-10-CM | POA: Diagnosis not present

## 2017-10-10 DIAGNOSIS — C569 Malignant neoplasm of unspecified ovary: Secondary | ICD-10-CM | POA: Diagnosis not present

## 2017-10-15 ENCOUNTER — Ambulatory Visit: Payer: Self-pay | Admitting: Family Medicine

## 2017-10-17 DIAGNOSIS — C569 Malignant neoplasm of unspecified ovary: Secondary | ICD-10-CM | POA: Diagnosis not present

## 2017-10-17 DIAGNOSIS — Z5111 Encounter for antineoplastic chemotherapy: Secondary | ICD-10-CM | POA: Diagnosis not present

## 2017-10-21 DIAGNOSIS — C569 Malignant neoplasm of unspecified ovary: Secondary | ICD-10-CM | POA: Diagnosis not present

## 2017-10-21 DIAGNOSIS — Z5111 Encounter for antineoplastic chemotherapy: Secondary | ICD-10-CM | POA: Diagnosis not present

## 2017-10-22 ENCOUNTER — Ambulatory Visit: Payer: Medicare Other | Admitting: Family Medicine

## 2017-10-24 DIAGNOSIS — Z5111 Encounter for antineoplastic chemotherapy: Secondary | ICD-10-CM | POA: Diagnosis not present

## 2017-10-24 DIAGNOSIS — C569 Malignant neoplasm of unspecified ovary: Secondary | ICD-10-CM | POA: Diagnosis not present

## 2017-10-28 ENCOUNTER — Ambulatory Visit (INDEPENDENT_AMBULATORY_CARE_PROVIDER_SITE_OTHER): Payer: Medicare Other | Admitting: Family Medicine

## 2017-10-28 ENCOUNTER — Ambulatory Visit: Payer: Medicare Other | Admitting: Family Medicine

## 2017-10-28 VITALS — BP 104/62 | HR 100 | Temp 98.0°F | Resp 14 | Wt 179.8 lb

## 2017-10-28 DIAGNOSIS — E785 Hyperlipidemia, unspecified: Secondary | ICD-10-CM

## 2017-10-28 DIAGNOSIS — Z5320 Procedure and treatment not carried out because of patient's decision for unspecified reasons: Secondary | ICD-10-CM

## 2017-10-28 DIAGNOSIS — Z23 Encounter for immunization: Secondary | ICD-10-CM | POA: Diagnosis not present

## 2017-10-28 DIAGNOSIS — C799 Secondary malignant neoplasm of unspecified site: Secondary | ICD-10-CM

## 2017-10-28 DIAGNOSIS — F419 Anxiety disorder, unspecified: Secondary | ICD-10-CM | POA: Diagnosis not present

## 2017-10-28 DIAGNOSIS — E559 Vitamin D deficiency, unspecified: Secondary | ICD-10-CM

## 2017-10-28 DIAGNOSIS — G4709 Other insomnia: Secondary | ICD-10-CM | POA: Diagnosis not present

## 2017-10-28 MED ORDER — ALPRAZOLAM 0.5 MG PO TABS: 0.5000 mg | ORAL_TABLET | Freq: Three times a day (TID) | ORAL | 1 refills | Status: DC | PRN

## 2017-10-28 MED ORDER — VITAMIN D (ERGOCALCIFEROL) 1.25 MG (50000 UNIT) PO CAPS: ORAL_CAPSULE | ORAL | 3 refills | Status: AC

## 2017-10-28 NOTE — Progress Notes (Signed)
Nancy Downs  MRN: [id number] DOB: 01/22/1936  Subjective:  HPI  Patient is here for 4 months follow up. Last office visit was on 06/24/17. TSH, Lipid and MetC were checked last on 12/25/16. Patient is going through chemo every 3 weeks. Anxiety: patient is taking Xanax 1 tablet every 3 days or so and then at night 1/2 tablet at bedtime sometimes. Insomnia: patient takes Ambien every night.  Hyperlipidemia: Lab Results  Component Value Date   CHOL 178 12/25/2016   HDL 96 12/25/2016   LDLCALC 53 12/25/2016   TRIG 144 12/25/2016   CHOLHDL 2.5 10/07/2015    Patient Active Problem List   Diagnosis Date Noted  . Encounter for antineoplastic chemotherapy 05/04/2016  . Edema leg 04/11/2016  . Cellulitis 04/11/2016  . Allergic rhinitis 12/07/2015  . 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 Clinical depression 12/07/2015  . Elevated blood sugar 12/07/2015  . Acid reflux 12/07/2015  . Abdominal hernia 12/07/2015  . H/O: hysterectomy 12/07/2015  . Cannot sleep 12/07/2015  . Gonalgia 12/07/2015  . Cheek swelling 12/07/2015  . LBP (low back pain) 12/07/2015  . Cancer, metastatic ([place]) 12/07/2015  . Change in blood platelet count 12/07/2015  . Herpes zona 12/07/2015  . History of appendectomy 11/23/2015  . Cancer of ovary ([place]) 11/23/2015  . Osteoarthritis of knee 03/16/2015  . DJD (degenerative joint disease) of knee 12/20/2014  . 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 Primary localized osteoarthritis of left knee   . Positive test for genetic breast cancer susceptibility marker 09/08/2012  . Anxiety state 04/05/2012  . Primary malignant neoplasm of ovary ([place]) 10/22/2011  . ERRONEOUS ENCOUNTER--DISREGARD 10/17/2011  . Abdominal wall cellulitis 10/14/2011  . Anemia 10/14/2011  . Fever 10/14/2011  . Hyperlipidemia 10/14/2011  . UTI (lower urinary tract infection) 10/14/2011  . Alopecia 04/12/2010  . Local superficial swelling, mass or lump 04/12/2010    . Avitaminosis D 04/12/2010  . Abnormal blood chemistry 11/22/2009  . Adenopathy 11/22/2009  . Malaise and fatigue 11/22/2009  . Anxiety disorder 08/07/2007  . Leg varices 08/07/2007    Past Medical History:  Diagnosis Date  . Anemia   . Anxiety   . Arthritis   . Bronchitis 08/2011  . Cancer ([place])    liver  . Constipation   . Cough   . Easy bruising   . Fatigue   . Hyperlipidemia   . PONV (postoperative nausea and vomiting)    happened just with knee surgery  2010  . Primary localized osteoarthritis of left knee     Social History   Socioeconomic History  . Marital status: Divorced    Spouse name: Not on file  . Number of children: Not on file  . Years of education: Not on file  . Highest education level: Not on file  Social Needs  . Financial resource strain: Not on file  . Food insecurity - worry: Not on file  . Food insecurity - inability: Not on file  . Transportation needs - medical: Not on file  . Transportation needs - non-medical: Not on file  Occupational History  . Not on file  Tobacco Use  . Smoking status: Never Smoker  . Smokeless tobacco: Never Used  Substance and Sexual Activity  . Alcohol use: Yes    Comment: occasional  . Drug use: No  . Sexual activity: Not on file  Other Topics Concern  . Not on file  Social History Narrative  . Not on file  Outpatient Encounter Medications as of 10/28/2017  Medication Sig  . ALPRAZolam (XANAX) 0.5 MG tablet Take 1 tablet (0.5 mg total) every 8 (eight) hours as needed by mouth.  . ASPIRIN 81 PO Take daily by mouth.  . calcium carbonate (OS-CAL) 600 MG TABS tablet Take 600 mg by mouth 2 (two) times daily with a meal.  . celecoxib (CELEBREX) 200 MG capsule Take 1 capsule (200 mg total) by mouth daily.  . citalopram (CELEXA) 20 MG tablet Take 1 tablet (20 mg total) by mouth every morning.  [facility] HYDROcodone-acetaminophen (NORCO) 10-325 MG tablet Take 0.5 tablets by mouth 2 (two) times daily as needed.   .  loratadine (CLARITIN) 10 MG tablet Take 10 mg by mouth daily.  . montelukast (SINGULAIR) 10 MG tablet Take 1 tablet (10 mg total) by mouth daily.  . ondansetron (ZOFRAN) 4 MG tablet Take 4 mg by mouth every 8 (eight) hours as needed for nausea or vomiting.  . rosuvastatin (CRESTOR) 10 MG tablet Take 1 tablet (10 mg total) by mouth daily.  . sennosides-docusate sodium (SENOKOT-S) 8.6-50 MG tablet Take 2 tablets by mouth 2 (two) times daily as needed for constipation. For Constipation    . Vitamin D, Ergocalciferol, (DRISDOL) 50000 units CAPS capsule TAKE 1 CAPSULE BY MOUTH EVERY WEEK ON TUESDAYS  . zolpidem (AMBIEN) 10 MG tablet Take 1 tablet (10 mg total) by mouth at bedtime as needed for sleep.  . [DISCONTINUED] ALPRAZolam (XANAX) 0.5 MG tablet Take 1 tablet (0.5 mg total) by mouth every 8 (eight) hours as needed.  . [DISCONTINUED] aspirin EC 325 MG tablet 1 tab a day for the next 30 days to prevent blood clots  . [DISCONTINUED] Vitamin D, Ergocalciferol, (DRISDOL) 50000 units CAPS capsule TAKE 1 CAPSULE BY MOUTH EVERY WEEK ON TUESDAYS   No facility-administered encounter medications on file as of 10/28/2017.     Allergies  Allergen Reactions  . Sodium Tetradecyl Sulfate Shortness Of Breath    SOB, coughing, rash neck/chest     Review of Systems  Constitutional: Positive for malaise/fatigue. Negative for chills and fever.  Eyes: Negative.   Respiratory: Negative.   Cardiovascular: Negative.   Gastrointestinal: Positive for constipation (off and on) and nausea. Negative for abdominal pain, diarrhea and vomiting.  Musculoskeletal: Positive for back pain and joint pain.  Skin: Negative.   Neurological: Positive for dizziness and weakness. Negative for tingling and headaches.  Psychiatric/Behavioral: Negative.     Objective:  BP 104/62   Pulse 100   Temp 98 F (36.7 C)   Resp 14   Wt 179 lb 12.8 oz (81.6 kg)   BMI 27.95 kg/m   Physical Exam  Constitutional: She is oriented to  person, place, and time and well-developed, well-nourished, and in no distress.  HENT:  Head: Normocephalic and atraumatic.  Eyes: Conjunctivae are normal. No scleral icterus.  Neck: No thyromegaly present.  Cardiovascular: Normal rate, regular rhythm and normal heart sounds.  Pulmonary/Chest: Effort normal and breath sounds normal.  Abdominal: Soft.  Neurological: She is alert and oriented to person, place, and time. Gait normal. GCS score is 15.  Skin: Skin is warm.  Psychiatric: Mood, memory, affect and judgment normal.    Assessment and Plan :  1. Cancer, metastatic ([facility])--Ovarian On chemo. Follows oncology.  2. Anxiety disorder, unspecified type Stable. Refill Xanax today.  3. Hyperlipidemia, unspecified hyperlipidemia type Check lab work on next visit.  4. Other insomnia Stable.  5. Avitaminosis D Refill given today. - Vitamin D,  Ergocalciferol, (DRISDOL) 50000 units CAPS capsule; TAKE 1 CAPSULE BY MOUTH EVERY WEEK ON TUESDAYS  Dispense: 12 capsule; Refill: 3  HPI, Exam and A&P transcribed by Tiffany Kocher, RMA under direction and in the presence of Miguel Aschoff, MD.

## 2017-11-07 DIAGNOSIS — C569 Malignant neoplasm of unspecified ovary: Secondary | ICD-10-CM | POA: Diagnosis not present

## 2017-11-07 DIAGNOSIS — Z5111 Encounter for antineoplastic chemotherapy: Secondary | ICD-10-CM | POA: Diagnosis not present

## 2017-11-14 DIAGNOSIS — C569 Malignant neoplasm of unspecified ovary: Secondary | ICD-10-CM | POA: Diagnosis not present

## 2017-11-14 DIAGNOSIS — Z5111 Encounter for antineoplastic chemotherapy: Secondary | ICD-10-CM | POA: Diagnosis not present

## 2017-11-20 ENCOUNTER — Other Ambulatory Visit: Payer: Self-pay | Admitting: Family Medicine

## 2017-11-20 DIAGNOSIS — Z5111 Encounter for antineoplastic chemotherapy: Secondary | ICD-10-CM | POA: Diagnosis not present

## 2017-11-20 DIAGNOSIS — E785 Hyperlipidemia, unspecified: Secondary | ICD-10-CM

## 2017-11-20 DIAGNOSIS — C569 Malignant neoplasm of unspecified ovary: Secondary | ICD-10-CM | POA: Diagnosis not present

## 2017-12-16 DIAGNOSIS — R188 Other ascites: Secondary | ICD-10-CM | POA: Diagnosis not present

## 2017-12-16 DIAGNOSIS — C569 Malignant neoplasm of unspecified ovary: Secondary | ICD-10-CM | POA: Diagnosis not present

## 2017-12-16 DIAGNOSIS — Z5111 Encounter for antineoplastic chemotherapy: Secondary | ICD-10-CM | POA: Diagnosis not present

## 2017-12-23 DIAGNOSIS — Z803 Family history of malignant neoplasm of breast: Secondary | ICD-10-CM | POA: Diagnosis not present

## 2017-12-23 DIAGNOSIS — Z6827 Body mass index (BMI) 27.0-27.9, adult: Secondary | ICD-10-CM | POA: Diagnosis not present

## 2017-12-23 DIAGNOSIS — Z9221 Personal history of antineoplastic chemotherapy: Secondary | ICD-10-CM | POA: Diagnosis not present

## 2017-12-23 DIAGNOSIS — Z1509 Genetic susceptibility to other malignant neoplasm: Secondary | ICD-10-CM | POA: Diagnosis not present

## 2017-12-23 DIAGNOSIS — C569 Malignant neoplasm of unspecified ovary: Secondary | ICD-10-CM | POA: Diagnosis not present

## 2017-12-23 DIAGNOSIS — Z90710 Acquired absence of both cervix and uterus: Secondary | ICD-10-CM | POA: Diagnosis not present

## 2017-12-23 DIAGNOSIS — Z79899 Other long term (current) drug therapy: Secondary | ICD-10-CM | POA: Diagnosis not present

## 2017-12-23 DIAGNOSIS — Z86718 Personal history of other venous thrombosis and embolism: Secondary | ICD-10-CM | POA: Diagnosis not present

## 2017-12-23 DIAGNOSIS — Z1501 Genetic susceptibility to malignant neoplasm of breast: Secondary | ICD-10-CM | POA: Diagnosis not present

## 2017-12-23 DIAGNOSIS — Z90722 Acquired absence of ovaries, bilateral: Secondary | ICD-10-CM | POA: Diagnosis not present

## 2017-12-23 DIAGNOSIS — C787 Secondary malignant neoplasm of liver and intrahepatic bile duct: Secondary | ICD-10-CM | POA: Diagnosis not present

## 2017-12-23 DIAGNOSIS — Z8041 Family history of malignant neoplasm of ovary: Secondary | ICD-10-CM | POA: Diagnosis not present

## 2017-12-24 ENCOUNTER — Other Ambulatory Visit: Payer: Self-pay | Admitting: Family Medicine

## 2017-12-24 DIAGNOSIS — E785 Hyperlipidemia, unspecified: Secondary | ICD-10-CM

## 2017-12-26 DIAGNOSIS — Z5111 Encounter for antineoplastic chemotherapy: Secondary | ICD-10-CM | POA: Diagnosis not present

## 2017-12-26 DIAGNOSIS — Z7689 Persons encountering health services in other specified circumstances: Secondary | ICD-10-CM | POA: Diagnosis not present

## 2017-12-26 DIAGNOSIS — D701 Agranulocytosis secondary to cancer chemotherapy: Secondary | ICD-10-CM | POA: Diagnosis not present

## 2017-12-26 DIAGNOSIS — T451X5A Adverse effect of antineoplastic and immunosuppressive drugs, initial encounter: Secondary | ICD-10-CM | POA: Diagnosis not present

## 2017-12-26 DIAGNOSIS — C569 Malignant neoplasm of unspecified ovary: Secondary | ICD-10-CM | POA: Diagnosis not present

## 2018-01-09 DIAGNOSIS — C569 Malignant neoplasm of unspecified ovary: Secondary | ICD-10-CM | POA: Diagnosis not present

## 2018-01-09 DIAGNOSIS — Z5111 Encounter for antineoplastic chemotherapy: Secondary | ICD-10-CM | POA: Diagnosis not present

## 2018-01-16 DIAGNOSIS — C569 Malignant neoplasm of unspecified ovary: Secondary | ICD-10-CM | POA: Diagnosis not present

## 2018-01-16 DIAGNOSIS — Z5111 Encounter for antineoplastic chemotherapy: Secondary | ICD-10-CM | POA: Diagnosis not present

## 2018-01-20 DIAGNOSIS — Z79899 Other long term (current) drug therapy: Secondary | ICD-10-CM | POA: Diagnosis not present

## 2018-01-20 DIAGNOSIS — C569 Malignant neoplasm of unspecified ovary: Secondary | ICD-10-CM | POA: Diagnosis not present

## 2018-01-20 DIAGNOSIS — D701 Agranulocytosis secondary to cancer chemotherapy: Secondary | ICD-10-CM | POA: Diagnosis not present

## 2018-01-20 DIAGNOSIS — Z5111 Encounter for antineoplastic chemotherapy: Secondary | ICD-10-CM | POA: Diagnosis not present

## 2018-01-20 DIAGNOSIS — Z7982 Long term (current) use of aspirin: Secondary | ICD-10-CM | POA: Diagnosis not present

## 2018-01-20 DIAGNOSIS — T451X5A Adverse effect of antineoplastic and immunosuppressive drugs, initial encounter: Secondary | ICD-10-CM | POA: Diagnosis not present

## 2018-01-30 DIAGNOSIS — C569 Malignant neoplasm of unspecified ovary: Secondary | ICD-10-CM | POA: Diagnosis not present

## 2018-01-30 DIAGNOSIS — Z5111 Encounter for antineoplastic chemotherapy: Secondary | ICD-10-CM | POA: Diagnosis not present

## 2018-02-05 ENCOUNTER — Ambulatory Visit (INDEPENDENT_AMBULATORY_CARE_PROVIDER_SITE_OTHER): Payer: Medicare Other | Admitting: Physician Assistant

## 2018-02-05 ENCOUNTER — Encounter: Payer: Self-pay | Admitting: Physician Assistant

## 2018-02-05 VITALS — BP 116/76 | HR 96 | Temp 98.3°F | Resp 16 | Wt 176.0 lb

## 2018-02-05 DIAGNOSIS — R69 Illness, unspecified: Secondary | ICD-10-CM | POA: Diagnosis not present

## 2018-02-05 DIAGNOSIS — R059 Cough, unspecified: Secondary | ICD-10-CM

## 2018-02-05 DIAGNOSIS — C569 Malignant neoplasm of unspecified ovary: Secondary | ICD-10-CM | POA: Diagnosis not present

## 2018-02-05 DIAGNOSIS — R05 Cough: Secondary | ICD-10-CM

## 2018-02-05 DIAGNOSIS — J111 Influenza due to unidentified influenza virus with other respiratory manifestations: Secondary | ICD-10-CM

## 2018-02-05 DIAGNOSIS — Z5111 Encounter for antineoplastic chemotherapy: Secondary | ICD-10-CM | POA: Diagnosis not present

## 2018-02-05 MED ORDER — OSELTAMIVIR PHOSPHATE 75 MG PO CAPS: 75.0000 mg | ORAL_CAPSULE | Freq: Two times a day (BID) | ORAL | 0 refills | Status: AC

## 2018-02-05 MED ORDER — HYDROCODONE-HOMATROPINE 5-1.5 MG/5ML PO SYRP: 5.0000 mL | ORAL_SOLUTION | Freq: Every evening | ORAL | 0 refills | Status: AC | PRN

## 2018-02-05 NOTE — Progress Notes (Signed)
Patient: Nancy Downs Female    DOB: 01-07-1936   82 y.o.   MRN: [id number] Visit Date: 02/05/2018  Today's Provider: Trinna Post, PA-C   Chief Complaint  Patient presents with  . URI    Started four days ago.    Subjective:    Nancy Downs is a 82 y/o woman with history of ovarian cancer and currently undergoing chemotherapy presenting today for 7 days of sneezing and congestion. Pertinent info below. Her cough is not productive. She denies fevers. She has not had much of an appetite and reports she hasn't been eating or drinking consistently.   URI   This is a new problem. The current episode started in the past 7 days. The problem has been gradually worsening. There has been no fever. Associated symptoms include congestion, coughing, headaches, nausea, rhinorrhea, sneezing and a sore throat. Pertinent negatives include no abdominal pain, diarrhea, ear pain, sinus pain, vomiting or wheezing.       Allergies  Allergen Reactions  . Sodium Tetradecyl Sulfate Shortness Of Breath    SOB, coughing, rash neck/chest      Current Outpatient Medications:  .  ALPRAZolam (XANAX) 0.5 MG tablet, Take 1 tablet (0.5 mg total) every 8 (eight) hours as needed by mouth., Disp: 180 tablet, Rfl: 1 .  ASPIRIN 81 PO, Take daily by mouth., Disp: , Rfl:  .  calcium carbonate (OS-CAL) 600 MG TABS tablet, Take 600 mg by mouth 2 (two) times daily with a meal., Disp: , Rfl:  .  celecoxib (CELEBREX) 200 MG capsule, Take 1 capsule (200 mg total) by mouth daily., Disp: 30 capsule, Rfl: 3 .  citalopram (CELEXA) 20 MG tablet, Take 1 tablet (20 mg total) by mouth every morning., Disp: 90 tablet, Rfl: 3 .  HYDROcodone-acetaminophen (NORCO) 10-325 MG tablet, Take 0.5 tablets by mouth 2 (two) times daily as needed. , Disp: , Rfl: 0 .  loratadine (CLARITIN) 10 MG tablet, Take 10 mg by mouth daily., Disp: , Rfl:  .  montelukast (SINGULAIR) 10 MG tablet, Take 1 tablet (10 mg total) by mouth daily., Disp:  90 tablet, Rfl: 3 .  ondansetron (ZOFRAN) 4 MG tablet, Take 4 mg by mouth every 8 (eight) hours as needed for nausea or vomiting., Disp: , Rfl:  .  rosuvastatin (CRESTOR) 10 MG tablet, TAKE ONE TABLET BY MOUTH EVERY DAY, Disp: 30 tablet, Rfl: 12 .  sennosides-docusate sodium (SENOKOT-S) 8.6-50 MG tablet, Take 2 tablets by mouth 2 (two) times daily as needed for constipation. For Constipation  , Disp: , Rfl:  .  Vitamin D, Ergocalciferol, (DRISDOL) 50000 units CAPS capsule, TAKE 1 CAPSULE BY MOUTH EVERY WEEK ON TUESDAYS, Disp: 12 capsule, Rfl: 3 .  zolpidem (AMBIEN) 10 MG tablet, Take 1 tablet (10 mg total) by mouth at bedtime as needed for sleep., Disp: 90 tablet, Rfl: 1  Review of Systems  Constitutional: Positive for fatigue. Negative for activity change, appetite change, chills, diaphoresis, fever and unexpected weight change.  HENT: Positive for congestion, postnasal drip, rhinorrhea, sneezing and sore throat. Negative for ear discharge, ear pain, mouth sores, nosebleeds, sinus pressure, sinus pain, tinnitus, trouble swallowing and voice change.   Eyes: Positive for redness (Left eye). Negative for photophobia, pain, discharge, itching and visual disturbance.  Respiratory: Positive for cough and chest tightness. Negative for apnea, choking, shortness of breath, wheezing and stridor.   Gastrointestinal: Positive for nausea. Negative for abdominal distention, abdominal pain, anal bleeding, blood in  stool, constipation, diarrhea, rectal pain and vomiting.  Neurological: Positive for dizziness and headaches. Negative for light-headedness.    Social History   Tobacco Use  . Smoking status: Never Smoker  . Smokeless tobacco: Never Used  Substance Use Topics  . Alcohol use: Yes    Comment: occasional   Objective:   BP 116/76 (BP Location: Left Arm, Patient Position: Sitting, Cuff Size: Normal)   Pulse 96   Temp 98.3 F (36.8 C) (Oral)   Resp 16   Wt 176 lb (79.8 kg)   SpO2 99%   BMI  27.36 kg/m  Vitals:   02/05/18 1652  BP: 116/76  Pulse: 96  Resp: 16  Temp: 98.3 F (36.8 C)  TempSrc: Oral  SpO2: 99%  Weight: 176 lb (79.8 kg)     Physical Exam  Constitutional: She is oriented to person, place, and time. She appears well-developed and well-nourished. No distress.  Frail appearing woman, non-toxic.   HENT:  Right Ear: External ear normal.  Left Ear: External ear normal.  Mouth/Throat: Oropharynx is clear and moist. Mucous membranes are dry. No oropharyngeal exudate.  Cardiovascular: Normal rate and regular rhythm.  Pulmonary/Chest: Effort normal and breath sounds normal. No respiratory distress. She has no wheezes. She has no rales.  Lymphadenopathy:    She has no cervical adenopathy.  Neurological: She is alert and oriented to person, place, and time.  Skin: Skin is warm and dry.  Skin with good turgor.   Psychiatric: She has a normal mood and affect. Her behavior is normal.        Assessment & Plan:     1. Influenza-like illness  Patient is a chronically ill woman with ovarian cancer. She is currently undergoing chemotherapy and does appear ill from this, but she does not appear toxic. Her pulse is slightly elevated, but do think this is likely from dehydration and lack of oral intake. She is afebrile, not in respiratory distress, non-toxic appearing. Her rapid flu is negative today, but she would like empiric therapy for influenza like illness. With her compromised immune state, would also like CXR to evaluate for pneumonia to see if antibiotics are appropriate.   - oseltamivir (TAMIFLU) 75 MG capsule; Take 1 capsule (75 mg total) by mouth 2 (two) times daily for 5 days.  Dispense: 10 capsule; Refill: 0  2. Cough  - DG Chest 2 View; Future - HYDROcodone-homatropine (HYCODAN) 5-1.5 MG/5ML syrup; Take 5 mLs by mouth at bedtime as needed for cough.  Dispense: 120 mL; Refill: 0 - POCT Influenza A/B  3. Primary malignant neoplasm of ovary, unspecified  laterality ([place])  Return if symptoms worsen or fail to improve.   The entirety of the information documented in the History of Present Illness, Review of Systems and Physical Exam were personally obtained by me. Portions of this information were initially documented by Ashley Royalty, CMA and reviewed by me for thoroughness and accuracy.           Trinna Post, PA-C  [facility]

## 2018-02-05 NOTE — Patient Instructions (Signed)
Upper Respiratory Infection, Adult Most upper respiratory infections (URIs) are caused by a virus. A URI affects the nose, throat, and upper air passages. The most common type of URI is often called "the common cold." Follow these instructions at home:  Take medicines only as told by your doctor.  Gargle warm saltwater or take cough drops to comfort your throat as told by your doctor.  Use a warm mist humidifier or inhale steam from a shower to increase air moisture. This may make it easier to breathe.  Drink enough fluid to keep your pee (urine) clear or pale yellow.  Eat soups and other clear broths.  Have a healthy diet.  Rest as needed.  Go back to work when your fever is gone or your doctor says it is okay. ? You may need to stay home longer to avoid giving your URI to others. ? You can also wear a face mask and wash your hands often to prevent spread of the virus.  Use your inhaler more if you have asthma.  Do not use any tobacco products, including cigarettes, chewing tobacco, or electronic cigarettes. If you need help quitting, ask your doctor. Contact a doctor if:  You are getting worse, not better.  Your symptoms are not helped by medicine.  You have chills.  You are getting more short of breath.  You have brown or red mucus.  You have yellow or brown discharge from your nose.  You have pain in your face, especially when you bend forward.  You have a fever.  You have puffy (swollen) neck glands.  You have pain while swallowing.  You have white areas in the back of your throat. Get help right away if:  You have very bad or constant: ? Headache. ? Ear pain. ? Pain in your forehead, behind your eyes, and over your cheekbones (sinus pain). ? Chest pain.  You have long-lasting (chronic) lung disease and any of the following: ? Wheezing. ? Long-lasting cough. ? Coughing up blood. ? A change in your usual mucus.  You have a stiff neck.  You have  changes in your: ? Vision. ? Hearing. ? Thinking. ? Mood. This information is not intended to replace advice given to you by your health care provider. Make sure you discuss any questions you have with your health care provider. Document Released: 05/14/2008 Document Revised: 07/29/2016 Document Reviewed: 03/03/2014 Elsevier Interactive Patient Education  2018 Elsevier Inc.  

## 2018-02-06 ENCOUNTER — Ambulatory Visit
Admission: RE | Admit: 2018-02-06 | Discharge: 2018-02-06 | Disposition: A | Discharge status: Home / Self Care | Payer: Medicare Other | Source: Ambulatory Visit | Attending: Physician Assistant | Admitting: Physician Assistant

## 2018-02-06 DIAGNOSIS — R059 Cough, unspecified: Secondary | ICD-10-CM

## 2018-02-06 DIAGNOSIS — Z79899 Other long term (current) drug therapy: Secondary | ICD-10-CM | POA: Insufficient documentation

## 2018-02-06 DIAGNOSIS — R05 Cough: Secondary | ICD-10-CM | POA: Insufficient documentation

## 2018-02-06 LAB — POCT INFLUENZA A/B
Influenza A, POC: NEGATIVE
Influenza B, POC: NEGATIVE

## 2018-02-07 ENCOUNTER — Telehealth: Payer: Self-pay

## 2018-02-07 NOTE — Telephone Encounter (Signed)
Pt advised.   Thanks,   -Korban Shearer  

## 2018-02-07 NOTE — Telephone Encounter (Signed)
-----   Message from Trinna Post, [place] sent at 02/07/2018 11:13 AM EST ----- There is no pneumonia or findings of metastatic disease. Do not think antibiotics would be helpful at this point.

## 2018-02-08 ENCOUNTER — Other Ambulatory Visit: Payer: Self-pay | Admitting: Family Medicine

## 2018-02-08 DIAGNOSIS — G4709 Other insomnia: Secondary | ICD-10-CM

## 2018-02-11 NOTE — Telephone Encounter (Signed)
Pt requested the Rx be faxed to [facility] and Rx was faxed to [facility]. Thanks TNP

## 2018-02-11 NOTE — Telephone Encounter (Signed)
Faxed Rx for zolpidem (AMBIEN) 10 MG tablet to [facility] in [address] Marina Gravel TNP

## 2018-02-14 DIAGNOSIS — T451X5A Adverse effect of antineoplastic and immunosuppressive drugs, initial encounter: Secondary | ICD-10-CM | POA: Diagnosis not present

## 2018-02-14 DIAGNOSIS — Z5111 Encounter for antineoplastic chemotherapy: Secondary | ICD-10-CM | POA: Diagnosis not present

## 2018-02-14 DIAGNOSIS — C569 Malignant neoplasm of unspecified ovary: Secondary | ICD-10-CM | POA: Diagnosis not present

## 2018-02-14 DIAGNOSIS — Z7689 Persons encountering health services in other specified circumstances: Secondary | ICD-10-CM | POA: Diagnosis not present

## 2018-02-14 DIAGNOSIS — D701 Agranulocytosis secondary to cancer chemotherapy: Secondary | ICD-10-CM | POA: Diagnosis not present

## 2018-02-24 ENCOUNTER — Other Ambulatory Visit: Payer: Self-pay

## 2018-02-24 ENCOUNTER — Telehealth: Payer: Self-pay

## 2018-02-24 DIAGNOSIS — G4709 Other insomnia: Secondary | ICD-10-CM

## 2018-02-24 MED ORDER — ZOLPIDEM TARTRATE 5 MG PO TABS: 10.0000 mg | ORAL_TABLET | Freq: Every day | ORAL | 0 refills | Status: AC

## 2018-02-24 NOTE — Telephone Encounter (Signed)
Patient has been advised that her insurance company has recommended that we decrease her Zolpidem based on the recommendation of the American Geriatrics Society's [place] criteria.  Patient will start decreasing her use to half tab at a time and we will give her 5 mg on her next refill.

## 2018-02-26 ENCOUNTER — Ambulatory Visit (INDEPENDENT_AMBULATORY_CARE_PROVIDER_SITE_OTHER): Payer: Medicare Other | Admitting: Family Medicine

## 2018-02-26 ENCOUNTER — Ambulatory Visit (INDEPENDENT_AMBULATORY_CARE_PROVIDER_SITE_OTHER): Payer: Medicare Other

## 2018-02-26 ENCOUNTER — Encounter: Payer: Self-pay | Admitting: Family Medicine

## 2018-02-26 VITALS — BP 122/70 | HR 96 | Temp 97.9°F | Resp 15

## 2018-02-26 VITALS — BP 122/70 | HR 96 | Temp 97.9°F | Ht 67.0 in

## 2018-02-26 DIAGNOSIS — F419 Anxiety disorder, unspecified: Secondary | ICD-10-CM

## 2018-02-26 DIAGNOSIS — C569 Malignant neoplasm of unspecified ovary: Secondary | ICD-10-CM | POA: Diagnosis not present

## 2018-02-26 DIAGNOSIS — E785 Hyperlipidemia, unspecified: Secondary | ICD-10-CM

## 2018-02-26 DIAGNOSIS — Z0000 Encounter for general adult medical examination without abnormal findings: Secondary | ICD-10-CM

## 2018-02-26 NOTE — Consult Note (Signed)
Patient: Nancy Downs Female    DOB: 1936-09-19   82 y.o.   MRN: [id number] Visit Date: 02/26/2018  Today's Provider: Wilhemena Durie, MD   Chief Complaint  Patient presents with  . Follow-up   Subjective:    HPI   Lipid/Cholesterol, Follow-up:   Last seen for this 3 months ago.  Management since that visit includes none.  Last Lipid Panel:    Component Value Date/Time   CHOL 178 12/25/2016 1438   TRIG 144 12/25/2016 1438   HDL 96 12/25/2016 1438   CHOLHDL 2.5 10/07/2015 0821   LDLCALC 53 12/25/2016 1438    She reports excellent compliance with treatment. She is not having side effects.   Wt Readings from Last 3 Encounters:  02/05/18 176 lb (79.8 kg)  10/28/17 179 lb 12.8 oz (81.6 kg)  06/24/17 180 lb (81.6 kg)    Insomnia Follow Up  She presents today for follow up of insomnia. Insomnia is getting unchanged. She has trouble sleeping several times a month.   She has difficulty FALLING asleep. She has difficulty STAYING asleep. She does wake frequently to urinate. She does not have urge to move legs when resting. She is not having pain when trying to sleep  She is not having anxiety. She is not having a lot of stress in her life. . She is not having depression. Patient reports good compliance and tolerance on medication   Vitamin D Deficiency  Follow Up:  Patient returns to office today for follow up from 10/28/17   Anxiety Follow Up: Patient returns for follow up from 10/28/17, patients condition is stable and reports good compliance, tolerance and symptom control on medication   Depression screen [facility] 2/9 02/26/2018 06/24/2017 05/16/2016  Decreased Interest 0 1 1  Down, Depressed, Hopeless 0 0 0  PHQ - 2 Score 0 1 1    Allergies  Allergen Reactions  . Sodium Tetradecyl Sulfate Shortness Of Breath    SOB, coughing, rash neck/chest      Current Outpatient Medications:  .  ALPRAZolam (XANAX) 0.5 MG tablet, Take 1 tablet (0.5 mg total)  every 8 (eight) hours as needed by mouth., Disp: 180 tablet, Rfl: 1 .  aspirin 325 MG tablet, Take 325 mg by mouth daily., Disp: , Rfl:  .  ASPIRIN 81 PO, Take daily by mouth., Disp: , Rfl:  .  calcium carbonate (OS-CAL) 600 MG TABS tablet, Take 600 mg by mouth 2 (two) times daily with a meal., Disp: , Rfl:  .  celecoxib (CELEBREX) 200 MG capsule, Take 1 capsule (200 mg total) by mouth daily., Disp: 30 capsule, Rfl: 3 .  citalopram (CELEXA) 20 MG tablet, Take 1 tablet (20 mg total) by mouth every morning., Disp: 90 tablet, Rfl: 3 .  HYDROcodone-acetaminophen (NORCO) 10-325 MG tablet, Take 0.5 tablets by mouth 2 (two) times daily as needed. , Disp: , Rfl: 0 .  HYDROcodone-homatropine (HYCODAN) 5-1.5 MG/5ML syrup, Take 5 mLs by mouth at bedtime as needed for cough., Disp: 120 mL, Rfl: 0 .  loratadine (CLARITIN) 10 MG tablet, Take 10 mg by mouth daily., Disp: , Rfl:  .  montelukast (SINGULAIR) 10 MG tablet, Take 1 tablet (10 mg total) by mouth daily., Disp: 90 tablet, Rfl: 3 .  ondansetron (ZOFRAN) 4 MG tablet, Take 4 mg by mouth every 8 (eight) hours as needed for nausea or vomiting., Disp: , Rfl:  .  oxyCODONE-acetaminophen (PERCOCET/ROXICET) 5-325 MG tablet, Take 1 tablet by  mouth every 4 (four) hours as needed. , Disp: , Rfl:  .  rosuvastatin (CRESTOR) 10 MG tablet, TAKE ONE TABLET BY MOUTH EVERY DAY, Disp: 30 tablet, Rfl: 12 .  sennosides-docusate sodium (SENOKOT-S) 8.6-50 MG tablet, Take 2 tablets by mouth 2 (two) times daily as needed for constipation. For Constipation  , Disp: , Rfl:  .  Vitamin D, Ergocalciferol, (DRISDOL) 50000 units CAPS capsule, TAKE 1 CAPSULE BY MOUTH EVERY WEEK ON TUESDAYS, Disp: 12 capsule, Rfl: 3 .  zolpidem (AMBIEN) 5 MG tablet, Take 2 tablets (10 mg total) by mouth at bedtime., Disp: 90 tablet, Rfl: 0      Review of Systems  Constitutional: Positive for fatigue.  HENT: Positive for rhinorrhea.   Respiratory: Positive for cough.   Gastrointestinal: Positive for  constipation.  Musculoskeletal: Positive for arthralgias and back pain.  Allergic/Immunologic: Positive for environmental allergies.  Hematological: Bruises/bleeds easily.    Social History   Tobacco Use  . Smoking status: Never Smoker  . Smokeless tobacco: Never Used  Substance Use Topics  . Alcohol use: Yes    Comment: occasionally   Objective:   BP 122/70   Pulse 96   Temp 97.9 F (36.6 C) (Oral)   Resp 15  Vitals:   02/26/18 1129  BP: 122/70  Pulse: 96  Resp: 15  Temp: 97.9 F (36.6 C)  TempSrc: Oral     Physical Exam  Constitutional: She appears well-developed.  Neck: Normal range of motion.  Pulmonary/Chest: Effort normal.  Musculoskeletal: Normal range of motion.        Assessment & Plan:           Wilhemena Durie, MD  [facility]

## 2018-02-26 NOTE — Progress Notes (Signed)
Subjective:   Nancy Downs is a 82 y.o. female who presents for Medicare Annual (Subsequent) preventive examination.  Review of Systems:  N/A  Cardiac Risk Factors include: advanced age (>48men, >72 women);dyslipidemia     Objective:     Vitals: BP 122/70 (BP Location: Left Arm)   Pulse 96   Temp 97.9 F (36.6 C) (Oral)   Ht 5\' 7"  (1.702 m)   BMI 27.57 kg/m   Body mass index is 27.57 kg/m.  Advanced Directives 02/26/2018 12/25/2016 12/09/2014 10/22/2011  Does Patient Have a Medical Advance Directive? Yes No Yes Patient does not have advance directive  Type of Scientist, forensic Power of [place];Living will - - -  Copy of [place] in Chart? No - copy requested - - -    Tobacco Social History   Tobacco Use  Smoking Status Never Smoker  Smokeless Tobacco Never Used     Counseling given: Not Answered   Clinical Intake:  Pre-visit preparation completed: Yes  Pain : No/denies pain Pain Score: 0-No pain     Nutritional Status: BMI 25 -29 Overweight Nutritional Risks: Nausea/ vomitting/ diarrhea(nausea occasionally dut to chemotherapy.) Diabetes: No  How often do you need to have someone help you when you read instructions, pamphlets, or other written materials from your doctor or pharmacy?: 1 - Never  Interpreter Needed?: No  Information entered by :: [facility], LPN  Past Medical History:  Diagnosis Date  . Anemia   . Anxiety   . Arthritis   . Bronchitis 08/2011  . Cancer ([place])    liver  . Constipation   . Cough   . Easy bruising   . Fatigue   . Hyperlipidemia   . PONV (postoperative nausea and vomiting)    happened just with knee surgery  2010  . Primary localized osteoarthritis of left knee    Past Surgical History:  Procedure Laterality Date  . ABDOMINAL HYSTERECTOMY  1977   uterine cancer   . ANKLE SURGERY  1996   broken ankle plates and pins put in place   . APPENDECTOMY  1966  . KNEE SURGERY  2004/2012  .  OVARY SURGERY     robotic BSO for ovarian cancer '13  . PORTACATH PLACEMENT  10/23/2011   Procedure: INSERTION PORT-A-CATH;  Surgeon: Odis Hollingshead, MD;  Location: [place];  Service: General;  Laterality: Right;  insertion right internal jugular port-a-cath  . SPIDER VEIN INJECTION  1968   varicose veins stripped   . TONSILLECTOMY AND ADENOIDECTOMY  1945  . TOTAL KNEE ARTHROPLASTY Left 12/20/2014   Procedure: LEFT TOTAL KNEE ARTHROPLASTY;  Surgeon: Lorn Junes, MD;  Location: [place];  Service: Orthopedics;  Laterality: Left;   Family History  Problem Relation Age of Onset  . Cancer Mother 16       ovarian   . Cancer Father 78       lung cancer  . Cancer Sister        breast  . Cancer Sister        breast   Social History   Socioeconomic History  . Marital status: Divorced    Spouse name: None  . Number of children: 4  . Years of education: None  . Highest education level: Some college, no degree  Social Needs  . Financial resource strain: Not hard at all  . Food insecurity - worry: Never true  . Food insecurity - inability: Never true  . Transportation needs -  medical: No  . Transportation needs - non-medical: No  Occupational History  . Occupation: retired  Tobacco Use  . Smoking status: Never Smoker  . Smokeless tobacco: Never Used  Substance and Sexual Activity  . Alcohol use: Yes    Comment: occasionally  . Drug use: No  . Sexual activity: None  Other Topics Concern  . None  Social History Narrative  . None    Outpatient Encounter Medications as of 02/26/2018  Medication Sig  . ALPRAZolam (XANAX) 0.5 MG tablet Take 1 tablet (0.5 mg total) every 8 (eight) hours as needed by mouth.  Marland Kitchen aspirin 325 MG tablet Take 325 mg by mouth daily.  . calcium carbonate (OS-CAL) 600 MG TABS tablet Take 600 mg by mouth 2 (two) times daily with a meal.  . celecoxib (CELEBREX) 200 MG capsule Take 1 capsule (200 mg total) by mouth daily.  . citalopram (CELEXA) 20 MG tablet  Take 1 tablet (20 mg total) by mouth every morning.  Marland Kitchen HYDROcodone-acetaminophen (NORCO) 10-325 MG tablet Take 0.5 tablets by mouth 2 (two) times daily as needed.   Marland Kitchen HYDROcodone-homatropine (HYCODAN) 5-1.5 MG/5ML syrup Take 5 mLs by mouth at bedtime as needed for cough.  . loratadine (CLARITIN) 10 MG tablet Take 10 mg by mouth daily.  . montelukast (SINGULAIR) 10 MG tablet Take 1 tablet (10 mg total) by mouth daily.  . ondansetron (ZOFRAN) 4 MG tablet Take 4 mg by mouth every 8 (eight) hours as needed for nausea or vomiting.  Marland Kitchen oxyCODONE-acetaminophen (PERCOCET/ROXICET) 5-325 MG tablet Take 1 tablet by mouth every 4 (four) hours as needed.   . rosuvastatin (CRESTOR) 10 MG tablet TAKE ONE TABLET BY MOUTH EVERY DAY  . sennosides-docusate sodium (SENOKOT-S) 8.6-50 MG tablet Take 2 tablets by mouth 2 (two) times daily as needed for constipation. For Constipation    . Vitamin D, Ergocalciferol, (DRISDOL) 50000 units CAPS capsule TAKE 1 CAPSULE BY MOUTH EVERY WEEK ON TUESDAYS  . zolpidem (AMBIEN) 5 MG tablet Take 2 tablets (10 mg total) by mouth at bedtime.  . ASPIRIN 81 PO Take daily by mouth.   No facility-administered encounter medications on file as of 02/26/2018.     Activities of Daily Living In your present state of health, do you have any difficulty performing the following activities: 02/26/2018 06/24/2017  Hearing? N N  Vision? N N  Difficulty concentrating or making decisions? N N  Walking or climbing stairs? Y N  Comment Due to weak knees.  -  Dressing or bathing? N N  Doing errands, shopping? N N  Preparing Food and eating ? N -  Using the Toilet? N -  In the past six months, have you accidently leaked urine? N -  Do you have problems with loss of bowel control? N -  Managing your Medications? N -  Managing your Finances? N -  Housekeeping or managing your Housekeeping? N -  Some recent data might be hidden    Patient Care Team: Jerrol Banana., MD as PCP - General  (Family Medicine) Idelle Leech, OD as Consulting Physician (Optometry) Nancy Marus, MD as Attending Physician (Gynecologic Oncology) Ardell Isaacs, NP as Nurse Practitioner (Surgical Oncology)    Assessment:   This is a routine wellness examination for [place].  Exercise Activities and Dietary recommendations Current Exercise Habits: The patient does not participate in regular exercise at present, Exercise limited by: Other - see comments(Currently having chemo treatments.)  Goals    . DIET -  INCREASE WATER INTAKE     Recommend increasing water intake to 6-8 glasses a day.        Fall Risk Fall Risk  02/26/2018 06/24/2017 05/16/2016  Falls in the past year? No Yes No  Number falls in past yr: - 2 or more -  Injury with Fall? - Yes -  Follow up - Falls evaluation completed;Falls prevention discussed -   Is the patient's home free of loose throw rugs in walkways, pet beds, electrical cords, etc?   yes      Grab bars in the bathroom? yes      Handrails on the stairs?   no      Adequate lighting?   yes  Timed Get Up and Go performed: N/A  Depression Screen PHQ 2/9 Scores 02/26/2018 06/24/2017 05/16/2016  PHQ - 2 Score 0 1 1     Cognitive Function: Pt declined screening today.        Immunization History  Administered Date(s) Administered  . Influenza Whole 10/30/2007, 10/13/2008  . Influenza, High Dose Seasonal PF 10/11/2016, 10/28/2017  . Influenza-Unspecified 08/10/2014  . Pneumococcal Conjugate-13 03/02/2015  . Pneumococcal Polysaccharide-23 02/13/2012, 09/24/2012  . Td 12/10/2000, 04/12/2010  . Zoster 01/19/2013    Qualifies for Shingles Vaccine? Due for Shingles vaccine. Declined my offer to administer today. Education has been provided regarding the importance of this vaccine. Pt has been advised to call her insurance company to determine her out of pocket expense. Advised she may also receive this vaccine at her local pharmacy or Health Dept. Verbalized acceptance  and understanding.  Screening Tests Health Maintenance  Topic Date Due  . TETANUS/TDAP  04/12/2020  . INFLUENZA VACCINE  Completed  . DEXA SCAN  Completed  . PNA vac Low Risk Adult  Completed    Cancer Screenings: Lung: Low Dose CT Chest recommended if Age 91-80 years, 30 pack-year currently smoking OR have quit w/in 15years. Patient does not qualify. Breast:  Up to date on Mammogram? Yes   Up to date of Bone Density/Dexa? Yes Colorectal: N/A  Additional Screenings:  Hepatitis C Screening: N/A     Plan:  I have personally reviewed and addressed the Medicare Annual Wellness questionnaire and have noted the following in the patient's chart:  A. Medical and social history B. Use of alcohol, tobacco or illicit drugs  C. Current medications and supplements D. Functional ability and status E.  Nutritional status F.  Physical activity G. Advance directives H. List of other physicians I.  Hospitalizations, surgeries, and ER visits in previous 12 months J.  [place] such as hearing and vision if needed, cognitive and depression L. Referrals and appointments - none  In addition, I have reviewed and discussed with patient certain preventive protocols, quality metrics, and best practice recommendations. A written personalized care plan for preventive services as well as general preventive health recommendations were provided to patient.  See attached scanned questionnaire for additional information.   Signed,  Fabio Neighbors, LPN Nurse Health Advisor   Nurse Recommendations: None.

## 2018-02-26 NOTE — Patient Instructions (Addendum)
Nancy Downs , Thank you for taking time to come for your Medicare Wellness Visit. I appreciate your ongoing commitment to your health goals. Please review the following plan we discussed and let me know if I can assist you in the future.   Screening recommendations/referrals: Colonoscopy: N/A Mammogram: Up to date Bone Density: Up to date Recommended yearly ophthalmology/optometry visit for glaucoma screening and checkup Recommended yearly dental visit for hygiene and checkup  Vaccinations: Influenza vaccine: Up to date Pneumococcal vaccine: Up to date Tdap vaccine: Up to date Shingles vaccine: Pt declines today.     Advanced directives: Please bring a copy of your POA (Power of Attorney) and/or Living Will to your next appointment.   Conditions/risks identified: Recommend increasing water intake to 6-8 glasses a day.   Next appointment: 10:40 AM today with Dr Rosanna Randy.    Preventive Care 20 Years and Older, Female Preventive care refers to lifestyle choices and visits with your health care provider that can promote health and wellness. What does preventive care include?  A yearly physical exam. This is also called an annual well check.  Dental exams once or twice a year.  Routine eye exams. Ask your health care provider how often you should have your eyes checked.  Personal lifestyle choices, including:  Daily care of your teeth and gums.  Regular physical activity.  Eating a healthy diet.  Avoiding tobacco and drug use.  Limiting alcohol use.  Practicing safe sex.  Taking low-dose aspirin every day.  Taking vitamin and mineral supplements as recommended by your health care provider. What happens during an annual well check? The services and screenings done by your health care provider during your annual well check will depend on your age, overall health, lifestyle risk factors, and family history of disease. Counseling  Your health care provider may ask you  questions about your:  Alcohol use.  Tobacco use.  Drug use.  Emotional well-being.  Home and relationship well-being.  Sexual activity.  Eating habits.  History of falls.  Memory and ability to understand (cognition).  Work and work Statistician.  Reproductive health. Screening  You may have the following tests or measurements:  Height, weight, and BMI.  Blood pressure.  Lipid and cholesterol levels. These may be checked every 5 years, or more frequently if you are over 9 years old.  Skin check.  Lung cancer screening. You may have this screening every year starting at age 88 if you have a 30-pack-year history of smoking and currently smoke or have quit within the past 15 years.  Fecal occult blood test (FOBT) of the stool. You may have this test every year starting at age 30.  Flexible sigmoidoscopy or colonoscopy. You may have a sigmoidoscopy every 5 years or a colonoscopy every 10 years starting at age 68.  Hepatitis C blood test.  Hepatitis B blood test.  Sexually transmitted disease (STD) testing.  Diabetes screening. This is done by checking your blood sugar (glucose) after you have not eaten for a while (fasting). You may have this done every 1-3 years.  Bone density scan. This is done to screen for osteoporosis. You may have this done starting at age 64.  Mammogram. This may be done every 1-2 years. Talk to your health care provider about how often you should have regular mammograms. Talk with your health care provider about your test results, treatment options, and if necessary, the need for more tests. Vaccines  Your health care provider may recommend certain vaccines,  such as:  Influenza vaccine. This is recommended every year.  Tetanus, diphtheria, and acellular pertussis (Tdap, Td) vaccine. You may need a Td booster every 10 years.  Zoster vaccine. You may need this after age 66.  Pneumococcal 13-valent conjugate (PCV13) vaccine. One dose is  recommended after age 28.  Pneumococcal polysaccharide (PPSV23) vaccine. One dose is recommended after age 72. Talk to your health care provider about which screenings and vaccines you need and how often you need them. This information is not intended to replace advice given to you by your health care provider. Make sure you discuss any questions you have with your health care provider. Document Released: 12/23/2015 Document Revised: 08/15/2016 Document Reviewed: 09/27/2015 Elsevier Interactive Patient Education  2017 [place] Prevention in the Home Falls can cause injuries. They can happen to people of all ages. There are many things you can do to make your home safe and to help prevent falls. What can I do on the outside of my home?  Regularly fix the edges of walkways and driveways and fix any cracks.  Remove anything that might make you trip as you walk through a door, such as a raised step or threshold.  Trim any bushes or trees on the path to your home.  Use bright outdoor lighting.  Clear any walking paths of anything that might make someone trip, such as rocks or tools.  Regularly check to see if handrails are loose or broken. Make sure that both sides of any steps have handrails.  Any raised decks and porches should have guardrails on the edges.  Have any leaves, snow, or ice cleared regularly.  Use sand or salt on walking paths during winter.  Clean up any spills in your garage right away. This includes oil or grease spills. What can I do in the bathroom?  Use night lights.  Install grab bars by the toilet and in the tub and shower. Do not use towel bars as grab bars.  Use non-skid mats or decals in the tub or shower.  If you need to sit down in the shower, use a plastic, non-slip stool.  Keep the floor dry. Clean up any water that spills on the floor as soon as it happens.  Remove soap buildup in the tub or shower regularly.  Attach bath mats  securely with double-sided non-slip rug tape.  Do not have throw rugs and other things on the floor that can make you trip. What can I do in the bedroom?  Use night lights.  Make sure that you have a light by your bed that is easy to reach.  Do not use any sheets or blankets that are too big for your bed. They should not hang down onto the floor.  Have a firm chair that has side arms. You can use this for support while you get dressed.  Do not have throw rugs and other things on the floor that can make you trip. What can I do in the kitchen?  Clean up any spills right away.  Avoid walking on wet floors.  Keep items that you use a lot in easy-to-reach places.  If you need to reach something above you, use a strong step stool that has a grab bar.  Keep electrical cords out of the way.  Do not use floor polish or wax that makes floors slippery. If you must use wax, use non-skid floor wax.  Do not have throw rugs and other things on  the floor that can make you trip. What can I do with my stairs?  Do not leave any items on the stairs.  Make sure that there are handrails on both sides of the stairs and use them. Fix handrails that are broken or loose. Make sure that handrails are as long as the stairways.  Check any carpeting to make sure that it is firmly attached to the stairs. Fix any carpet that is loose or worn.  Avoid having throw rugs at the top or bottom of the stairs. If you do have throw rugs, attach them to the floor with carpet tape.  Make sure that you have a light switch at the top of the stairs and the bottom of the stairs. If you do not have them, ask someone to add them for you. What else can I do to help prevent falls?  Wear shoes that:  Do not have high heels.  Have rubber bottoms.  Are comfortable and fit you well.  Are closed at the toe. Do not wear sandals.  If you use a stepladder:  Make sure that it is fully opened. Do not climb a closed  stepladder.  Make sure that both sides of the stepladder are locked into place.  Ask someone to hold it for you, if possible.  Clearly mark and make sure that you can see:  Any grab bars or handrails.  First and last steps.  Where the edge of each step is.  Use tools that help you move around (mobility aids) if they are needed. These include:  Canes.  Walkers.  Scooters.  Crutches.  Turn on the lights when you go into a dark area. Replace any light bulbs as soon as they burn out.  Set up your furniture so you have a clear path. Avoid moving your furniture around.  If any of your floors are uneven, fix them.  If there are any pets around you, be aware of where they are.  Review your medicines with your doctor. Some medicines can make you feel dizzy. This can increase your chance of falling. Ask your doctor what other things that you can do to help prevent falls. This information is not intended to replace advice given to you by your health care provider. Make sure you discuss any questions you have with your health care provider. Document Released: 09/22/2009 Document Revised: 05/03/2016 Document Reviewed: 12/31/2014 Elsevier Interactive Patient Education  2017 [employer].

## 2018-02-27 DIAGNOSIS — Z5111 Encounter for antineoplastic chemotherapy: Secondary | ICD-10-CM | POA: Diagnosis not present

## 2018-02-27 DIAGNOSIS — R590 Localized enlarged lymph nodes: Secondary | ICD-10-CM | POA: Diagnosis not present

## 2018-02-27 DIAGNOSIS — C569 Malignant neoplasm of unspecified ovary: Secondary | ICD-10-CM | POA: Diagnosis not present

## 2018-02-27 DIAGNOSIS — R1900 Intra-abdominal and pelvic swelling, mass and lump, unspecified site: Secondary | ICD-10-CM | POA: Diagnosis not present

## 2018-02-27 DIAGNOSIS — K769 Liver disease, unspecified: Secondary | ICD-10-CM | POA: Diagnosis not present

## 2018-03-04 NOTE — Progress Notes (Signed)
Patient: Nancy Downs Female    DOB: 24-Sep-1936   82 y.o.   MRN: [id number] Visit Date: 02/26/2018  Today's Provider: Wilhemena Durie, MD      Chief Complaint  Patient presents with  . Follow-up   Subjective:  HPI   Lipid/Cholesterol, Follow-up:   Last seen for this 3 months ago.  Management since that visit includes none.  Last Lipid Panel: Labs(Brief)          Component Value Date/Time   CHOL 178 12/25/2016 1438   TRIG 144 12/25/2016 1438   HDL 96 12/25/2016 1438   CHOLHDL 2.5 10/07/2015 0821   LDLCALC 53 12/25/2016 1438      She reports excellent compliance with treatment. She is not having side effects.      Wt Readings from Last 3 Encounters:  02/05/18 176 lb (79.8 kg)  10/28/17 179 lb 12.8 oz (81.6 kg)  06/24/17 180 lb (81.6 kg)    Insomnia Follow Up  She presents today for follow up of insomnia. Insomnia is getting unchanged. She has trouble sleeping several times a month.       She has difficulty FALLING asleep. She has difficulty STAYING asleep. She does wake frequently to urinate. She does not have urge to move legs when resting. She is not having pain when trying to sleep  She is not having anxiety. She is not having a lot of stress in her life. . She is not having depression. Patient reports good compliance and tolerance on medication   Vitamin D Deficiency  Follow Up:  Patient returns to office today for follow up from 10/28/17   Anxiety Follow Up: Patient returns for follow up from 10/28/17, patients condition is stable and reports good compliance, tolerance and symptom control on medication   Depression screen [facility] 2/9 02/26/2018 06/24/2017 05/16/2016  Decreased Interest 0 1 1  Down, Depressed, Hopeless 0 0 0  PHQ - 2 Score 0 1 1         Allergies  Allergen Reactions  . Sodium Tetradecyl Sulfate Shortness Of Breath    SOB, coughing, rash neck/chest      Current Outpatient Medications:    .  ALPRAZolam (XANAX) 0.5 MG tablet, Take 1 tablet (0.5 mg total) every 8 (eight) hours as needed by mouth., Disp: 180 tablet, Rfl: 1 .  aspirin 325 MG tablet, Take 325 mg by mouth daily., Disp: , Rfl:  .  ASPIRIN 81 PO, Take daily by mouth., Disp: , Rfl:  .  calcium carbonate (OS-CAL) 600 MG TABS tablet, Take 600 mg by mouth 2 (two) times daily with a meal., Disp: , Rfl:  .  celecoxib (CELEBREX) 200 MG capsule, Take 1 capsule (200 mg total) by mouth daily., Disp: 30 capsule, Rfl: 3 .  citalopram (CELEXA) 20 MG tablet, Take 1 tablet (20 mg total) by mouth every morning., Disp: 90 tablet, Rfl: 3 .  HYDROcodone-acetaminophen (NORCO) 10-325 MG tablet, Take 0.5 tablets by mouth 2 (two) times daily as needed. , Disp: , Rfl: 0 .  HYDROcodone-homatropine (HYCODAN) 5-1.5 MG/5ML syrup, Take 5 mLs by mouth at bedtime as needed for cough., Disp: 120 mL, Rfl: 0 .  loratadine (CLARITIN) 10 MG tablet, Take 10 mg by mouth daily., Disp: , Rfl:  .  montelukast (SINGULAIR) 10 MG tablet, Take 1 tablet (10 mg total) by mouth daily., Disp: 90 tablet, Rfl: 3 .  ondansetron (ZOFRAN) 4 MG tablet, Take 4 mg by mouth every 8 (  eight) hours as needed for nausea or vomiting., Disp: , Rfl:  .  oxyCODONE-acetaminophen (PERCOCET/ROXICET) 5-325 MG tablet, Take 1 tablet by mouth every 4 (four) hours as needed. , Disp: , Rfl:  .  rosuvastatin (CRESTOR) 10 MG tablet, TAKE ONE TABLET BY MOUTH EVERY DAY, Disp: 30 tablet, Rfl: 12 .  sennosides-docusate sodium (SENOKOT-S) 8.6-50 MG tablet, Take 2 tablets by mouth 2 (two) times daily as needed for constipation. For Constipation  , Disp: , Rfl:  .  Vitamin D, Ergocalciferol, (DRISDOL) 50000 units CAPS capsule, TAKE 1 CAPSULE BY MOUTH EVERY WEEK ON TUESDAYS, Disp: 12 capsule, Rfl: 3 .  zolpidem (AMBIEN) 5 MG tablet, Take 2 tablets (10 mg total) by mouth at bedtime., Disp: 90 tablet, Rfl: 0      Review of Systems  Constitutional: Positive for fatigue.  HENT: Positive for  rhinorrhea.   Respiratory: Positive for cough.   Gastrointestinal: Positive for constipation.  Musculoskeletal: Positive for arthralgias and back pain.  Allergic/Immunologic: Positive for environmental allergies.  Hematological: Bruises/bleeds easily.    Social History        Tobacco Use  . Smoking status: Never Smoker  . Smokeless tobacco: Never Used  Substance Use Topics  . Alcohol use: Yes    Comment: occasionally   Objective:   BP 122/70   Pulse 96   Temp 97.9 F (36.6 C) (Oral)   Resp 15     Vitals:   02/26/18 1129  BP: 122/70  Pulse: 96  Resp: 15  Temp: 97.9 F (36.6 C)  TempSrc: Oral     Physical Exam  Constitutional: She appears well-developed.  Neck: Normal range of motion.  Pulmonary/Chest: Effort normal.  Musculoskeletal: Normal range of motion.  HEENT--WNL COR-RRR Abd--soft Ext--no CCE Neuro-nonfocal      Assessment & Plan:   1. Hyperlipidemia, unspecified hyperlipidemia type  - TSH - Lipid panel 2.Metastatic Ovarian cancer 3.GAD  I have done the exam and reviewed the above chart and it is accurate to the best of my knowledge. Development worker, community has been used in this note in any air is in the dictation or transcription are unintentional.  Wilhemena Durie, MD  [place]

## 2018-03-05 DIAGNOSIS — Z5111 Encounter for antineoplastic chemotherapy: Secondary | ICD-10-CM | POA: Diagnosis not present

## 2018-03-05 DIAGNOSIS — C569 Malignant neoplasm of unspecified ovary: Secondary | ICD-10-CM | POA: Diagnosis not present

## 2018-03-07 DIAGNOSIS — Z7689 Persons encountering health services in other specified circumstances: Secondary | ICD-10-CM | POA: Diagnosis not present

## 2018-03-07 DIAGNOSIS — C569 Malignant neoplasm of unspecified ovary: Secondary | ICD-10-CM | POA: Diagnosis not present

## 2018-03-07 DIAGNOSIS — D701 Agranulocytosis secondary to cancer chemotherapy: Secondary | ICD-10-CM | POA: Diagnosis not present

## 2018-03-07 DIAGNOSIS — Z5111 Encounter for antineoplastic chemotherapy: Secondary | ICD-10-CM | POA: Diagnosis not present

## 2018-03-07 DIAGNOSIS — T451X5A Adverse effect of antineoplastic and immunosuppressive drugs, initial encounter: Secondary | ICD-10-CM | POA: Diagnosis not present

## 2018-03-11 ENCOUNTER — Encounter: Payer: Self-pay | Admitting: Family Medicine

## 2018-03-11 ENCOUNTER — Ambulatory Visit (INDEPENDENT_AMBULATORY_CARE_PROVIDER_SITE_OTHER): Payer: Medicare Other | Admitting: Family Medicine

## 2018-03-11 VITALS — BP 104/60 | HR 72 | Temp 97.6°F | Resp 16 | Wt 181.0 lb

## 2018-03-11 DIAGNOSIS — N3000 Acute cystitis without hematuria: Secondary | ICD-10-CM | POA: Diagnosis not present

## 2018-03-11 DIAGNOSIS — R300 Dysuria: Secondary | ICD-10-CM

## 2018-03-11 DIAGNOSIS — E785 Hyperlipidemia, unspecified: Secondary | ICD-10-CM | POA: Diagnosis not present

## 2018-03-11 LAB — POCT URINALYSIS DIPSTICK
Bilirubin, UA: NEGATIVE
GLUCOSE UA: NEGATIVE
Ketones, UA: NEGATIVE
NITRITE UA: NEGATIVE
PH UA: 5 (ref 5.0–8.0)
PROTEIN UA: NEGATIVE
RBC UA: NEGATIVE
Spec Grav, UA: 1.025 (ref 1.010–1.025)
UROBILINOGEN UA: 0.2 U/dL

## 2018-03-11 NOTE — Patient Instructions (Signed)
Azo (over the counter, will change the color of your urine) and Cranberry Juice for your symptoms, increase fluids and especially water. If the culture grows anything we will treat with antibiotics.

## 2018-03-11 NOTE — Progress Notes (Signed)
Patient: Nancy Downs Female    DOB: 02-05-1936   82 y.o.   MRN: [id number] Visit Date: 03/11/2018  Today's Provider: Wilhemena Durie, MD   Chief Complaint  Patient presents with  . Urinary Tract Infection   Subjective:    HPI Pt is here for urinary symptoms. She reports that they started last week. She has pressure in her bladder and today she had dysuria. She reports that she also has urinary urgency, and retention. She reports that her daughter thought that she had a fever over the weekend, but pt did not feel like she did. She reports that she does feel more tired and does not feel as good but she just had chemo so she does not know which it is from.      Allergies  Allergen Reactions  . Sodium Tetradecyl Sulfate Shortness Of Breath    SOB, coughing, rash neck/chest      Current Outpatient Medications:  .  ALPRAZolam (XANAX) 0.5 MG tablet, Take 1 tablet (0.5 mg total) every 8 (eight) hours as needed by mouth., Disp: 180 tablet, Rfl: 1 .  aspirin 325 MG tablet, Take 325 mg by mouth daily., Disp: , Rfl:  .  ASPIRIN 81 PO, Take daily by mouth., Disp: , Rfl:  .  calcium carbonate (OS-CAL) 600 MG TABS tablet, Take 600 mg by mouth 2 (two) times daily with a meal., Disp: , Rfl:  .  celecoxib (CELEBREX) 200 MG capsule, Take 1 capsule (200 mg total) by mouth daily., Disp: 30 capsule, Rfl: 3 .  citalopram (CELEXA) 20 MG tablet, Take 1 tablet (20 mg total) by mouth every morning., Disp: 90 tablet, Rfl: 3 .  montelukast (SINGULAIR) 10 MG tablet, Take 1 tablet (10 mg total) by mouth daily., Disp: 90 tablet, Rfl: 3 .  ondansetron (ZOFRAN) 4 MG tablet, Take 4 mg by mouth every 8 (eight) hours as needed for nausea or vomiting., Disp: , Rfl:  .  oxyCODONE-acetaminophen (PERCOCET/ROXICET) 5-325 MG tablet, Take 1 tablet by mouth every 4 (four) hours as needed. , Disp: , Rfl:  .  rosuvastatin (CRESTOR) 10 MG tablet, TAKE ONE TABLET BY MOUTH EVERY DAY, Disp: 30 tablet, Rfl: 12 .   sennosides-docusate sodium (SENOKOT-S) 8.6-50 MG tablet, Take 2 tablets by mouth 2 (two) times daily as needed for constipation. For Constipation  , Disp: , Rfl:  .  Vitamin D, Ergocalciferol, (DRISDOL) 50000 units CAPS capsule, TAKE 1 CAPSULE BY MOUTH EVERY WEEK ON TUESDAYS, Disp: 12 capsule, Rfl: 3 .  zolpidem (AMBIEN) 5 MG tablet, Take 2 tablets (10 mg total) by mouth at bedtime., Disp: 90 tablet, Rfl: 0 .  HYDROcodone-acetaminophen (NORCO) 10-325 MG tablet, Take 0.5 tablets by mouth 2 (two) times daily as needed. , Disp: , Rfl: 0 .  HYDROcodone-homatropine (HYCODAN) 5-1.5 MG/5ML syrup, Take 5 mLs by mouth at bedtime as needed for cough. (Patient not taking: Reported on 03/11/2018), Disp: 120 mL, Rfl: 0 .  loratadine (CLARITIN) 10 MG tablet, Take 10 mg by mouth daily., Disp: , Rfl:   Review of Systems  Constitutional: Positive for fatigue.  HENT: Negative.   Eyes: Negative.   Respiratory: Negative.   Cardiovascular: Negative.   Gastrointestinal: Negative.   Endocrine: Negative.   Genitourinary: Positive for decreased urine volume, difficulty urinating, dysuria, frequency, pelvic pain (pressure) and urgency.  Musculoskeletal: Positive for arthralgias.  Skin: Negative.   Allergic/Immunologic: Negative.   Neurological: Negative.   Hematological: Negative.   Psychiatric/Behavioral: Negative.  Social History   Tobacco Use  . Smoking status: Never Smoker  . Smokeless tobacco: Never Used  Substance Use Topics  . Alcohol use: Yes    Comment: occasionally   Objective:   BP 104/60 (BP Location: Left Arm, Patient Position: Sitting, Cuff Size: Normal)   Pulse 72   Temp 97.6 F (36.4 C) (Oral)   Resp 16   Wt 181 lb (82.1 kg)   BMI 28.35 kg/m  Vitals:   03/11/18 1047  BP: 104/60  Pulse: 72  Resp: 16  Temp: 97.6 F (36.4 C)  TempSrc: Oral  Weight: 181 lb (82.1 kg)     Physical Exam  Constitutional: She is oriented to person, place, and time. She appears well-developed and  well-nourished.  HENT:  Head: Normocephalic and atraumatic.  Eyes: Pupils are equal, round, and reactive to light. Conjunctivae and EOM are normal. No scleral icterus.  Neck: Normal range of motion. Neck supple. No thyromegaly present.  Cardiovascular: Normal rate, regular rhythm, normal heart sounds and intact distal pulses.  Pulmonary/Chest: Effort normal and breath sounds normal.  Abdominal: Soft.  Musculoskeletal: Normal range of motion.  Lymphadenopathy:    She has no cervical adenopathy.  Neurological: She is alert and oriented to person, place, and time. She has normal reflexes.  Skin: Skin is warm and dry.  Psychiatric: She has a normal mood and affect. Her behavior is normal. Judgment and thought content normal.        Assessment & Plan:     1. Dysuria  - POCT urinalysis dipstick - Urine Culture  2. Acute cystitis without hematuria Azo and Cranberry Juice for your symptoms, increase fluids and especially water. If the culture grows anything we will treat with antibiotics.  3.Ovarian Cancer    HPI, Exam, and A&P Transcribed under the direction and in the presence of Richard L. Cranford Mon, MD  Electronically Signed: Katina Dung, CMA  I have done the exam and reviewed the above chart and it is accurate to the best of my knowledge. Development worker, community has been used in this note in any air is in the dictation or transcription are unintentional.  Wilhemena Durie, MD  [place]

## 2018-03-12 LAB — LIPID PANEL
CHOL/HDL RATIO: 2 ratio (ref 0.0–4.4)
CHOLESTEROL TOTAL: 128 mg/dL (ref 100–199)
HDL: 64 mg/dL (ref >39)
LDL Calculated: 40 mg/dL (ref 0–99)
Triglycerides: 120 mg/dL (ref 0–149)
VLDL Cholesterol Cal: 24 mg/dL (ref 5–40)

## 2018-03-12 LAB — TSH: TSH: 2.96 u[IU]/mL (ref 0.450–4.500)

## 2018-03-13 LAB — URINE CULTURE

## 2018-03-20 DIAGNOSIS — Z5111 Encounter for antineoplastic chemotherapy: Secondary | ICD-10-CM | POA: Diagnosis not present

## 2018-03-20 DIAGNOSIS — C569 Malignant neoplasm of unspecified ovary: Secondary | ICD-10-CM | POA: Diagnosis not present

## 2018-03-25 DIAGNOSIS — Z1231 Encounter for screening mammogram for malignant neoplasm of breast: Secondary | ICD-10-CM | POA: Diagnosis not present

## 2018-03-26 ENCOUNTER — Other Ambulatory Visit: Payer: Self-pay

## 2018-03-26 ENCOUNTER — Other Ambulatory Visit: Payer: Self-pay | Admitting: Family Medicine

## 2018-03-26 DIAGNOSIS — E785 Hyperlipidemia, unspecified: Secondary | ICD-10-CM

## 2018-03-26 MED ORDER — ROSUVASTATIN CALCIUM 10 MG PO TABS: 10.0000 mg | ORAL_TABLET | Freq: Every day | ORAL | 12 refills | Status: DC

## 2018-03-26 MED ORDER — ROSUVASTATIN CALCIUM 10 MG PO TABS: 10.0000 mg | ORAL_TABLET | Freq: Every day | ORAL | 12 refills | Status: AC

## 2018-03-26 NOTE — Telephone Encounter (Signed)
[facility] ( [facility])  faxed a refill request for a 90-days supply for the following medication. Thanks CC  rosuvastatin (CRESTOR) 10 MG tablet

## 2018-03-27 DIAGNOSIS — C569 Malignant neoplasm of unspecified ovary: Secondary | ICD-10-CM | POA: Diagnosis not present

## 2018-03-27 DIAGNOSIS — Z5111 Encounter for antineoplastic chemotherapy: Secondary | ICD-10-CM | POA: Diagnosis not present

## 2018-03-31 DIAGNOSIS — C569 Malignant neoplasm of unspecified ovary: Secondary | ICD-10-CM | POA: Diagnosis not present

## 2018-03-31 DIAGNOSIS — Z5111 Encounter for antineoplastic chemotherapy: Secondary | ICD-10-CM | POA: Diagnosis not present

## 2018-03-31 DIAGNOSIS — T451X5A Adverse effect of antineoplastic and immunosuppressive drugs, initial encounter: Secondary | ICD-10-CM | POA: Diagnosis not present

## 2018-03-31 DIAGNOSIS — Z79899 Other long term (current) drug therapy: Secondary | ICD-10-CM | POA: Diagnosis not present

## 2018-03-31 DIAGNOSIS — D701 Agranulocytosis secondary to cancer chemotherapy: Secondary | ICD-10-CM | POA: Diagnosis not present

## 2018-04-07 DIAGNOSIS — C569 Malignant neoplasm of unspecified ovary: Secondary | ICD-10-CM | POA: Diagnosis not present

## 2018-04-07 DIAGNOSIS — Z1231 Encounter for screening mammogram for malignant neoplasm of breast: Secondary | ICD-10-CM | POA: Diagnosis not present

## 2018-04-07 DIAGNOSIS — Z1502 Genetic susceptibility to malignant neoplasm of ovary: Secondary | ICD-10-CM | POA: Diagnosis not present

## 2018-04-07 DIAGNOSIS — Z803 Family history of malignant neoplasm of breast: Secondary | ICD-10-CM | POA: Diagnosis not present

## 2018-04-10 DIAGNOSIS — C569 Malignant neoplasm of unspecified ovary: Secondary | ICD-10-CM | POA: Diagnosis not present

## 2018-04-10 DIAGNOSIS — Z5111 Encounter for antineoplastic chemotherapy: Secondary | ICD-10-CM | POA: Diagnosis not present

## 2018-04-17 DIAGNOSIS — Z5111 Encounter for antineoplastic chemotherapy: Secondary | ICD-10-CM | POA: Diagnosis not present

## 2018-04-17 DIAGNOSIS — C569 Malignant neoplasm of unspecified ovary: Secondary | ICD-10-CM | POA: Diagnosis not present

## 2018-04-21 DIAGNOSIS — D701 Agranulocytosis secondary to cancer chemotherapy: Secondary | ICD-10-CM | POA: Diagnosis not present

## 2018-04-21 DIAGNOSIS — Z5111 Encounter for antineoplastic chemotherapy: Secondary | ICD-10-CM | POA: Diagnosis not present

## 2018-04-21 DIAGNOSIS — C569 Malignant neoplasm of unspecified ovary: Secondary | ICD-10-CM | POA: Diagnosis not present

## 2018-04-21 DIAGNOSIS — Z79899 Other long term (current) drug therapy: Secondary | ICD-10-CM | POA: Diagnosis not present

## 2018-04-21 DIAGNOSIS — T451X5A Adverse effect of antineoplastic and immunosuppressive drugs, initial encounter: Secondary | ICD-10-CM | POA: Diagnosis not present

## 2018-05-01 DIAGNOSIS — C569 Malignant neoplasm of unspecified ovary: Secondary | ICD-10-CM | POA: Diagnosis not present

## 2018-05-01 DIAGNOSIS — Z5111 Encounter for antineoplastic chemotherapy: Secondary | ICD-10-CM | POA: Diagnosis not present

## 2018-05-06 ENCOUNTER — Other Ambulatory Visit: Payer: Self-pay | Admitting: Family Medicine

## 2018-05-06 DIAGNOSIS — C562 Malignant neoplasm of left ovary: Secondary | ICD-10-CM | POA: Diagnosis not present

## 2018-05-06 DIAGNOSIS — C7989 Secondary malignant neoplasm of other specified sites: Secondary | ICD-10-CM | POA: Diagnosis not present

## 2018-05-09 ENCOUNTER — Other Ambulatory Visit: Payer: Self-pay | Admitting: Family Medicine

## 2018-05-12 DIAGNOSIS — E7849 Other hyperlipidemia: Secondary | ICD-10-CM | POA: Diagnosis not present

## 2018-05-12 DIAGNOSIS — Z1509 Genetic susceptibility to other malignant neoplasm: Secondary | ICD-10-CM | POA: Diagnosis not present

## 2018-05-12 DIAGNOSIS — C569 Malignant neoplasm of unspecified ovary: Secondary | ICD-10-CM | POA: Diagnosis not present

## 2018-05-12 DIAGNOSIS — Z1501 Genetic susceptibility to malignant neoplasm of breast: Secondary | ICD-10-CM | POA: Diagnosis not present

## 2018-05-12 DIAGNOSIS — Z6827 Body mass index (BMI) 27.0-27.9, adult: Secondary | ICD-10-CM | POA: Diagnosis not present

## 2018-05-12 DIAGNOSIS — Z5111 Encounter for antineoplastic chemotherapy: Secondary | ICD-10-CM | POA: Diagnosis not present

## 2018-05-15 DIAGNOSIS — R072 Precordial pain: Secondary | ICD-10-CM | POA: Diagnosis not present

## 2018-05-15 DIAGNOSIS — C569 Malignant neoplasm of unspecified ovary: Secondary | ICD-10-CM | POA: Diagnosis not present

## 2018-05-15 DIAGNOSIS — Z5111 Encounter for antineoplastic chemotherapy: Secondary | ICD-10-CM | POA: Diagnosis not present

## 2018-05-19 DIAGNOSIS — Z5111 Encounter for antineoplastic chemotherapy: Secondary | ICD-10-CM | POA: Diagnosis not present

## 2018-05-19 DIAGNOSIS — K5903 Drug induced constipation: Secondary | ICD-10-CM | POA: Diagnosis not present

## 2018-05-19 DIAGNOSIS — Z79899 Other long term (current) drug therapy: Secondary | ICD-10-CM | POA: Diagnosis not present

## 2018-05-19 DIAGNOSIS — C569 Malignant neoplasm of unspecified ovary: Secondary | ICD-10-CM | POA: Diagnosis not present

## 2018-05-19 DIAGNOSIS — T402X5A Adverse effect of other opioids, initial encounter: Secondary | ICD-10-CM | POA: Diagnosis not present

## 2018-05-19 DIAGNOSIS — Z79891 Long term (current) use of opiate analgesic: Secondary | ICD-10-CM | POA: Diagnosis not present

## 2018-05-19 DIAGNOSIS — G893 Neoplasm related pain (acute) (chronic): Secondary | ICD-10-CM | POA: Diagnosis not present

## 2018-05-23 ENCOUNTER — Other Ambulatory Visit: Payer: Self-pay | Admitting: Family Medicine

## 2018-06-02 DIAGNOSIS — C569 Malignant neoplasm of unspecified ovary: Secondary | ICD-10-CM | POA: Diagnosis not present

## 2018-06-02 DIAGNOSIS — T451X5A Adverse effect of antineoplastic and immunosuppressive drugs, initial encounter: Secondary | ICD-10-CM | POA: Diagnosis not present

## 2018-06-02 DIAGNOSIS — Z5112 Encounter for antineoplastic immunotherapy: Secondary | ICD-10-CM | POA: Diagnosis not present

## 2018-06-11 DIAGNOSIS — Z5111 Encounter for antineoplastic chemotherapy: Secondary | ICD-10-CM | POA: Diagnosis not present

## 2018-06-11 DIAGNOSIS — C569 Malignant neoplasm of unspecified ovary: Secondary | ICD-10-CM | POA: Diagnosis not present

## 2018-06-16 DIAGNOSIS — T451X5A Adverse effect of antineoplastic and immunosuppressive drugs, initial encounter: Secondary | ICD-10-CM | POA: Diagnosis not present

## 2018-06-16 DIAGNOSIS — D701 Agranulocytosis secondary to cancer chemotherapy: Secondary | ICD-10-CM | POA: Diagnosis not present

## 2018-06-16 DIAGNOSIS — Z5111 Encounter for antineoplastic chemotherapy: Secondary | ICD-10-CM | POA: Diagnosis not present

## 2018-06-16 DIAGNOSIS — C569 Malignant neoplasm of unspecified ovary: Secondary | ICD-10-CM | POA: Diagnosis not present

## 2018-06-16 DIAGNOSIS — Z5112 Encounter for antineoplastic immunotherapy: Secondary | ICD-10-CM | POA: Diagnosis not present

## 2018-06-30 DIAGNOSIS — Z5112 Encounter for antineoplastic immunotherapy: Secondary | ICD-10-CM | POA: Diagnosis not present

## 2018-06-30 DIAGNOSIS — T451X5A Adverse effect of antineoplastic and immunosuppressive drugs, initial encounter: Secondary | ICD-10-CM | POA: Diagnosis not present

## 2018-06-30 DIAGNOSIS — C569 Malignant neoplasm of unspecified ovary: Secondary | ICD-10-CM | POA: Diagnosis not present

## 2018-06-30 DIAGNOSIS — D701 Agranulocytosis secondary to cancer chemotherapy: Secondary | ICD-10-CM | POA: Diagnosis not present

## 2018-07-01 ENCOUNTER — Ambulatory Visit: Payer: Self-pay | Admitting: Family Medicine

## 2018-07-09 ENCOUNTER — Encounter: Payer: Self-pay | Admitting: Family Medicine

## 2018-07-09 ENCOUNTER — Ambulatory Visit (INDEPENDENT_AMBULATORY_CARE_PROVIDER_SITE_OTHER): Payer: Medicare Other | Admitting: Family Medicine

## 2018-07-09 VITALS — BP 126/68 | HR 68 | Temp 97.9°F | Resp 16 | Ht 67.0 in | Wt 174.0 lb

## 2018-07-09 DIAGNOSIS — F3289 Other specified depressive episodes: Secondary | ICD-10-CM

## 2018-07-09 DIAGNOSIS — C569 Malignant neoplasm of unspecified ovary: Secondary | ICD-10-CM | POA: Diagnosis not present

## 2018-07-09 DIAGNOSIS — E785 Hyperlipidemia, unspecified: Secondary | ICD-10-CM | POA: Diagnosis not present

## 2018-07-09 NOTE — Progress Notes (Signed)
Patient: Nancy Downs Female    DOB: 1936-10-31   82 y.o.   MRN: [id number] Visit Date: 07/09/2018  Today's Provider: Wilhemena Durie, MD   Chief Complaint  Patient presents with  . Hyperlipidemia  . Depression  . Insomnia  . vitamin d deficiency   Subjective:    HPI Patient comes in today for a follow up on chronic issues. She was last seen in the office 4 months ago.  Pt only complaint today is fatigue she attributes to chemotherapy. Hypertension- she does check her BP occasionally. Denies any headaches, chest pain, shortness of breath, or dizziness.  Insomnia- Patient reports that she only takes 1/2 tablet (5mg ) of Ambien most nights. She does have the 10mg  tablet. She prefers this just incase she has a bad night. She reports that she has difficultly both falling asleep and staying asleep.    Depression- patient reports that she is doing well emotionally. She is currently taking citalopram 20mg  daily and takes alprazolam as needed for anxiety. She feels that both medications controls her symptoms.     Allergies  Allergen Reactions  . Sodium Tetradecyl Sulfate Shortness Of Breath    SOB, coughing, rash neck/chest      Current Outpatient Medications:  .  ALPRAZolam (XANAX) 0.5 MG tablet, TAKE ONE TABLET BY MOUTH EVERY EIGHT HOURS AS NEEDED, Disp: 180 tablet, Rfl: 0 .  aspirin 325 MG tablet, Take 325 mg by mouth daily., Disp: , Rfl:  .  ASPIRIN 81 PO, Take daily by mouth., Disp: , Rfl:  .  calcium carbonate (OS-CAL) 600 MG TABS tablet, Take 600 mg by mouth 2 (two) times daily with a meal., Disp: , Rfl:  .  celecoxib (CELEBREX) 200 MG capsule, Take 1 capsule (200 mg total) by mouth daily., Disp: 30 capsule, Rfl: 3 .  citalopram (CELEXA) 20 MG tablet, TAKE ONE TABLET BY MOUTH EVERY MORNING, Disp: 90 tablet, Rfl: 3 .  HYDROcodone-acetaminophen (NORCO) 10-325 MG tablet, Take 0.5 tablets by mouth 2 (two) times daily as needed. , Disp: , Rfl: 0 .  loratadine (CLARITIN) 10  MG tablet, Take 10 mg by mouth daily., Disp: , Rfl:  .  ondansetron (ZOFRAN) 4 MG tablet, Take 4 mg by mouth every 8 (eight) hours as needed for nausea or vomiting., Disp: , Rfl:  .  oxyCODONE-acetaminophen (PERCOCET/ROXICET) 5-325 MG tablet, Take 1 tablet by mouth every 4 (four) hours as needed. , Disp: , Rfl:  .  rosuvastatin (CRESTOR) 10 MG tablet, Take 1 tablet (10 mg total) by mouth daily., Disp: 30 tablet, Rfl: 12 .  sennosides-docusate sodium (SENOKOT-S) 8.6-50 MG tablet, Take 2 tablets by mouth 2 (two) times daily as needed for constipation. For Constipation  , Disp: , Rfl:  .  Vitamin D, Ergocalciferol, (DRISDOL) 50000 units CAPS capsule, TAKE 1 CAPSULE BY MOUTH EVERY WEEK ON TUESDAYS, Disp: 12 capsule, Rfl: 3 .  zolpidem (AMBIEN) 10 MG tablet, TAKE ONE TABLET BY MOUTH AT BEDTIME , Disp: 90 tablet, Rfl: 0 .  zolpidem (AMBIEN) 5 MG tablet, Take 2 tablets (10 mg total) by mouth at bedtime., Disp: 90 tablet, Rfl: 0 .  HYDROcodone-homatropine (HYCODAN) 5-1.5 MG/5ML syrup, Take 5 mLs by mouth at bedtime as needed for cough. (Patient not taking: Reported on 03/11/2018), Disp: 120 mL, Rfl: 0 .  montelukast (SINGULAIR) 10 MG tablet, TAKE ONE TABLET BY MOUTH EVERY DAY, Disp: 90 tablet, Rfl: 3  Review of Systems  Constitutional: Positive for fatigue.  HENT: Negative.   Eyes: Negative.   Respiratory: Negative.  Negative for cough and shortness of breath.   Cardiovascular: Negative for chest pain, palpitations and leg swelling.  Endocrine: Negative for cold intolerance, heat intolerance, polydipsia, polyphagia and polyuria.  Musculoskeletal: Positive for arthralgias.  Allergic/Immunologic: Negative.   Neurological: Negative for dizziness and headaches.  Psychiatric/Behavioral: Negative.     Social History   Tobacco Use  . Smoking status: Never Smoker  . Smokeless tobacco: Never Used  Substance Use Topics  . Alcohol use: Yes    Comment: occasionally   Objective:   BP 126/68 (BP Location:  Right Arm, Patient Position: Sitting, Cuff Size: Normal)   Pulse 68   Temp 97.9 F (36.6 C)   Resp 16   Ht 5\' 7"  (1.702 m)   Wt 174 lb (78.9 kg)   BMI 27.25 kg/m  Vitals:   07/09/18 1039  BP: 126/68  Pulse: 68  Resp: 16  Temp: 97.9 F (36.6 C)  Weight: 174 lb (78.9 kg)  Height: 5\' 7"  (1.702 m)     Physical Exam  Constitutional: She is oriented to person, place, and time. She appears well-developed and well-nourished.  HENT:  Head: Normocephalic and atraumatic.  Right Ear: External ear normal.  Left Ear: External ear normal.  Nose: Nose normal.  Eyes: Conjunctivae are normal. No scleral icterus.  Neck: No thyromegaly present.  Cardiovascular: Normal rate, regular rhythm and normal heart sounds.  Pulmonary/Chest: Effort normal and breath sounds normal.  Abdominal: Soft.  Musculoskeletal: She exhibits no edema.  Neurological: She is alert and oriented to person, place, and time.  Skin: Skin is warm and dry.  Psychiatric: She has a normal mood and affect. Her behavior is normal. Judgment and thought content normal.        Assessment & Plan:     Ovarian Cancer Per [facility] oncology MDD/GAD Stable--RTC 3 months. HLD Consider stopping crestor in future.     I have done the exam and reviewed the above chart and it is accurate to the best of my knowledge. Development worker, community has been used in this note in any air is in the dictation or transcription are unintentional.  Wilhemena Durie, MD  [place]

## 2018-07-10 DIAGNOSIS — Z5111 Encounter for antineoplastic chemotherapy: Secondary | ICD-10-CM | POA: Diagnosis not present

## 2018-07-10 DIAGNOSIS — C569 Malignant neoplasm of unspecified ovary: Secondary | ICD-10-CM | POA: Diagnosis not present

## 2018-07-14 DIAGNOSIS — L299 Pruritus, unspecified: Secondary | ICD-10-CM | POA: Diagnosis not present

## 2018-07-14 DIAGNOSIS — D701 Agranulocytosis secondary to cancer chemotherapy: Secondary | ICD-10-CM | POA: Diagnosis not present

## 2018-07-14 DIAGNOSIS — R630 Anorexia: Secondary | ICD-10-CM | POA: Diagnosis not present

## 2018-07-14 DIAGNOSIS — T451X5A Adverse effect of antineoplastic and immunosuppressive drugs, initial encounter: Secondary | ICD-10-CM | POA: Diagnosis not present

## 2018-07-14 DIAGNOSIS — Z79891 Long term (current) use of opiate analgesic: Secondary | ICD-10-CM | POA: Diagnosis not present

## 2018-07-14 DIAGNOSIS — C569 Malignant neoplasm of unspecified ovary: Secondary | ICD-10-CM | POA: Diagnosis not present

## 2018-07-14 DIAGNOSIS — Z5112 Encounter for antineoplastic immunotherapy: Secondary | ICD-10-CM | POA: Diagnosis not present

## 2018-07-14 DIAGNOSIS — Z79899 Other long term (current) drug therapy: Secondary | ICD-10-CM | POA: Diagnosis not present

## 2018-07-14 DIAGNOSIS — R110 Nausea: Secondary | ICD-10-CM | POA: Diagnosis not present

## 2018-07-14 DIAGNOSIS — M898X9 Other specified disorders of bone, unspecified site: Secondary | ICD-10-CM | POA: Diagnosis not present

## 2018-07-28 DIAGNOSIS — Z5112 Encounter for antineoplastic immunotherapy: Secondary | ICD-10-CM | POA: Diagnosis not present

## 2018-07-28 DIAGNOSIS — C569 Malignant neoplasm of unspecified ovary: Secondary | ICD-10-CM | POA: Diagnosis not present

## 2018-07-28 DIAGNOSIS — T451X5A Adverse effect of antineoplastic and immunosuppressive drugs, initial encounter: Secondary | ICD-10-CM | POA: Diagnosis not present

## 2018-07-28 DIAGNOSIS — D701 Agranulocytosis secondary to cancer chemotherapy: Secondary | ICD-10-CM | POA: Diagnosis not present

## 2018-07-28 DIAGNOSIS — Z79899 Other long term (current) drug therapy: Secondary | ICD-10-CM | POA: Diagnosis not present

## 2018-08-08 DIAGNOSIS — R590 Localized enlarged lymph nodes: Secondary | ICD-10-CM | POA: Diagnosis not present

## 2018-08-08 DIAGNOSIS — C561 Malignant neoplasm of right ovary: Secondary | ICD-10-CM | POA: Diagnosis not present

## 2018-08-08 DIAGNOSIS — C562 Malignant neoplasm of left ovary: Secondary | ICD-10-CM | POA: Diagnosis not present

## 2018-08-08 DIAGNOSIS — C569 Malignant neoplasm of unspecified ovary: Secondary | ICD-10-CM | POA: Diagnosis not present

## 2018-08-08 DIAGNOSIS — R591 Generalized enlarged lymph nodes: Secondary | ICD-10-CM | POA: Diagnosis not present

## 2018-08-08 DIAGNOSIS — R918 Other nonspecific abnormal finding of lung field: Secondary | ICD-10-CM | POA: Diagnosis not present

## 2018-08-18 DIAGNOSIS — Z6826 Body mass index (BMI) 26.0-26.9, adult: Secondary | ICD-10-CM | POA: Diagnosis not present

## 2018-08-18 DIAGNOSIS — C562 Malignant neoplasm of left ovary: Secondary | ICD-10-CM | POA: Diagnosis not present

## 2018-08-18 DIAGNOSIS — C561 Malignant neoplasm of right ovary: Secondary | ICD-10-CM | POA: Diagnosis not present

## 2018-08-26 DIAGNOSIS — C569 Malignant neoplasm of unspecified ovary: Secondary | ICD-10-CM | POA: Diagnosis not present

## 2018-09-04 DIAGNOSIS — Z5111 Encounter for antineoplastic chemotherapy: Secondary | ICD-10-CM | POA: Diagnosis not present

## 2018-09-04 DIAGNOSIS — C569 Malignant neoplasm of unspecified ovary: Secondary | ICD-10-CM | POA: Diagnosis not present

## 2018-09-08 ENCOUNTER — Other Ambulatory Visit: Payer: Self-pay | Admitting: Family Medicine

## 2018-09-11 DIAGNOSIS — Z5111 Encounter for antineoplastic chemotherapy: Secondary | ICD-10-CM | POA: Diagnosis not present

## 2018-09-11 DIAGNOSIS — C569 Malignant neoplasm of unspecified ovary: Secondary | ICD-10-CM | POA: Diagnosis not present

## 2018-09-15 DIAGNOSIS — D701 Agranulocytosis secondary to cancer chemotherapy: Secondary | ICD-10-CM | POA: Diagnosis not present

## 2018-09-15 DIAGNOSIS — T451X5A Adverse effect of antineoplastic and immunosuppressive drugs, initial encounter: Secondary | ICD-10-CM | POA: Diagnosis not present

## 2018-09-15 DIAGNOSIS — Z5111 Encounter for antineoplastic chemotherapy: Secondary | ICD-10-CM | POA: Diagnosis not present

## 2018-09-15 DIAGNOSIS — C569 Malignant neoplasm of unspecified ovary: Secondary | ICD-10-CM | POA: Diagnosis not present

## 2018-09-15 DIAGNOSIS — Z1501 Genetic susceptibility to malignant neoplasm of breast: Secondary | ICD-10-CM | POA: Diagnosis not present

## 2018-09-15 DIAGNOSIS — Z1509 Genetic susceptibility to other malignant neoplasm: Secondary | ICD-10-CM | POA: Diagnosis not present

## 2018-09-16 ENCOUNTER — Telehealth: Payer: Self-pay | Admitting: Family Medicine

## 2018-09-16 NOTE — Telephone Encounter (Signed)
Rodena Piety with [facility] called needing clinical information for the rx for the Zolpidem 10 mg.  They will fax the requesting form to [place] and ask that we fax it back to them when completed  Thanks Con Memos

## 2018-09-17 ENCOUNTER — Telehealth: Payer: Self-pay | Admitting: Family Medicine

## 2018-09-17 NOTE — Telephone Encounter (Signed)
Spoke with [facility] as below. Medication was approved.

## 2018-09-17 NOTE — Telephone Encounter (Signed)
Darrell w/ [employer] [phone]  Opt 5 Reference #  [id number]  Faxed Prior Authorizaton form on Oct. 8.  Needing a call back to answer questions on Rx:  zolpidem (AMBIEN) 10 MG tablet   Please call back to speed the approval process.  Thanks, [employer]

## 2018-09-17 NOTE — Telephone Encounter (Signed)
Spoke with [facility] and medication was approved.

## 2018-09-18 DIAGNOSIS — C569 Malignant neoplasm of unspecified ovary: Secondary | ICD-10-CM | POA: Diagnosis not present

## 2018-09-18 DIAGNOSIS — Z5111 Encounter for antineoplastic chemotherapy: Secondary | ICD-10-CM | POA: Diagnosis not present

## 2018-09-19 DIAGNOSIS — Z9221 Personal history of antineoplastic chemotherapy: Secondary | ICD-10-CM | POA: Diagnosis not present

## 2018-09-19 DIAGNOSIS — R918 Other nonspecific abnormal finding of lung field: Secondary | ICD-10-CM | POA: Diagnosis not present

## 2018-09-19 DIAGNOSIS — Z86718 Personal history of other venous thrombosis and embolism: Secondary | ICD-10-CM | POA: Diagnosis not present

## 2018-09-19 DIAGNOSIS — C569 Malignant neoplasm of unspecified ovary: Secondary | ICD-10-CM | POA: Diagnosis not present

## 2018-09-19 DIAGNOSIS — E785 Hyperlipidemia, unspecified: Secondary | ICD-10-CM | POA: Diagnosis not present

## 2018-09-19 DIAGNOSIS — Z803 Family history of malignant neoplasm of breast: Secondary | ICD-10-CM | POA: Diagnosis not present

## 2018-09-19 DIAGNOSIS — Z79899 Other long term (current) drug therapy: Secondary | ICD-10-CM | POA: Diagnosis not present

## 2018-09-19 DIAGNOSIS — R4182 Altered mental status, unspecified: Secondary | ICD-10-CM | POA: Diagnosis not present

## 2018-09-19 DIAGNOSIS — J90 Pleural effusion, not elsewhere classified: Secondary | ICD-10-CM | POA: Diagnosis not present

## 2018-09-19 DIAGNOSIS — F419 Anxiety disorder, unspecified: Secondary | ICD-10-CM | POA: Diagnosis not present

## 2018-09-22 DIAGNOSIS — C787 Secondary malignant neoplasm of liver and intrahepatic bile duct: Secondary | ICD-10-CM | POA: Diagnosis not present

## 2018-09-22 DIAGNOSIS — Z90722 Acquired absence of ovaries, bilateral: Secondary | ICD-10-CM | POA: Diagnosis not present

## 2018-09-22 DIAGNOSIS — K769 Liver disease, unspecified: Secondary | ICD-10-CM | POA: Diagnosis not present

## 2018-09-22 DIAGNOSIS — G4700 Insomnia, unspecified: Secondary | ICD-10-CM | POA: Diagnosis not present

## 2018-09-22 DIAGNOSIS — Z90710 Acquired absence of both cervix and uterus: Secondary | ICD-10-CM | POA: Diagnosis not present

## 2018-09-22 DIAGNOSIS — Z1509 Genetic susceptibility to other malignant neoplasm: Secondary | ICD-10-CM | POA: Diagnosis not present

## 2018-09-22 DIAGNOSIS — Z79891 Long term (current) use of opiate analgesic: Secondary | ICD-10-CM | POA: Diagnosis not present

## 2018-09-22 DIAGNOSIS — R590 Localized enlarged lymph nodes: Secondary | ICD-10-CM | POA: Diagnosis not present

## 2018-09-22 DIAGNOSIS — Z5111 Encounter for antineoplastic chemotherapy: Secondary | ICD-10-CM | POA: Diagnosis not present

## 2018-09-22 DIAGNOSIS — F419 Anxiety disorder, unspecified: Secondary | ICD-10-CM | POA: Diagnosis not present

## 2018-09-22 DIAGNOSIS — C562 Malignant neoplasm of left ovary: Secondary | ICD-10-CM | POA: Diagnosis not present

## 2018-09-22 DIAGNOSIS — Z803 Family history of malignant neoplasm of breast: Secondary | ICD-10-CM | POA: Diagnosis not present

## 2018-09-22 DIAGNOSIS — C561 Malignant neoplasm of right ovary: Secondary | ICD-10-CM | POA: Diagnosis not present

## 2018-09-22 DIAGNOSIS — Z7982 Long term (current) use of aspirin: Secondary | ICD-10-CM | POA: Diagnosis not present

## 2018-09-22 DIAGNOSIS — Z86718 Personal history of other venous thrombosis and embolism: Secondary | ICD-10-CM | POA: Diagnosis not present

## 2018-09-22 DIAGNOSIS — E785 Hyperlipidemia, unspecified: Secondary | ICD-10-CM | POA: Diagnosis not present

## 2018-09-22 DIAGNOSIS — Z79899 Other long term (current) drug therapy: Secondary | ICD-10-CM | POA: Diagnosis not present

## 2018-09-22 DIAGNOSIS — Z8541 Personal history of malignant neoplasm of cervix uteri: Secondary | ICD-10-CM | POA: Diagnosis not present

## 2018-09-22 DIAGNOSIS — Z8041 Family history of malignant neoplasm of ovary: Secondary | ICD-10-CM | POA: Diagnosis not present

## 2018-09-22 DIAGNOSIS — Z1501 Genetic susceptibility to malignant neoplasm of breast: Secondary | ICD-10-CM | POA: Diagnosis not present

## 2018-09-24 DIAGNOSIS — R4182 Altered mental status, unspecified: Secondary | ICD-10-CM | POA: Diagnosis not present

## 2018-09-24 DIAGNOSIS — R509 Fever, unspecified: Secondary | ICD-10-CM | POA: Diagnosis not present

## 2018-09-24 DIAGNOSIS — Z8541 Personal history of malignant neoplasm of cervix uteri: Secondary | ICD-10-CM | POA: Diagnosis not present

## 2018-09-24 DIAGNOSIS — F329 Major depressive disorder, single episode, unspecified: Secondary | ICD-10-CM | POA: Diagnosis not present

## 2018-09-24 DIAGNOSIS — Z6826 Body mass index (BMI) 26.0-26.9, adult: Secondary | ICD-10-CM | POA: Diagnosis not present

## 2018-09-24 DIAGNOSIS — D630 Anemia in neoplastic disease: Secondary | ICD-10-CM | POA: Diagnosis not present

## 2018-09-24 DIAGNOSIS — Z90710 Acquired absence of both cervix and uterus: Secondary | ICD-10-CM | POA: Diagnosis not present

## 2018-09-24 DIAGNOSIS — Z86718 Personal history of other venous thrombosis and embolism: Secondary | ICD-10-CM | POA: Diagnosis not present

## 2018-09-24 DIAGNOSIS — J90 Pleural effusion, not elsewhere classified: Secondary | ICD-10-CM | POA: Diagnosis not present

## 2018-09-24 DIAGNOSIS — Z79899 Other long term (current) drug therapy: Secondary | ICD-10-CM | POA: Diagnosis not present

## 2018-09-24 DIAGNOSIS — E785 Hyperlipidemia, unspecified: Secondary | ICD-10-CM | POA: Diagnosis not present

## 2018-09-24 DIAGNOSIS — G4700 Insomnia, unspecified: Secondary | ICD-10-CM | POA: Diagnosis not present

## 2018-09-24 DIAGNOSIS — R110 Nausea: Secondary | ICD-10-CM | POA: Diagnosis not present

## 2018-09-24 DIAGNOSIS — G893 Neoplasm related pain (acute) (chronic): Secondary | ICD-10-CM | POA: Diagnosis not present

## 2018-09-24 DIAGNOSIS — E871 Hypo-osmolality and hyponatremia: Secondary | ICD-10-CM | POA: Diagnosis not present

## 2018-09-24 DIAGNOSIS — E861 Hypovolemia: Secondary | ICD-10-CM | POA: Diagnosis not present

## 2018-09-24 DIAGNOSIS — Z7982 Long term (current) use of aspirin: Secondary | ICD-10-CM | POA: Diagnosis not present

## 2018-09-24 DIAGNOSIS — D899 Disorder involving the immune mechanism, unspecified: Secondary | ICD-10-CM | POA: Diagnosis not present

## 2018-09-24 DIAGNOSIS — Z90722 Acquired absence of ovaries, bilateral: Secondary | ICD-10-CM | POA: Diagnosis not present

## 2018-09-24 DIAGNOSIS — Z515 Encounter for palliative care: Secondary | ICD-10-CM | POA: Diagnosis not present

## 2018-09-24 DIAGNOSIS — D696 Thrombocytopenia, unspecified: Secondary | ICD-10-CM | POA: Diagnosis not present

## 2018-09-24 DIAGNOSIS — F419 Anxiety disorder, unspecified: Secondary | ICD-10-CM | POA: Diagnosis not present

## 2018-09-24 DIAGNOSIS — C569 Malignant neoplasm of unspecified ovary: Secondary | ICD-10-CM | POA: Diagnosis not present

## 2018-09-24 DIAGNOSIS — D849 Immunodeficiency, unspecified: Secondary | ICD-10-CM | POA: Diagnosis not present

## 2018-09-26 DIAGNOSIS — C569 Malignant neoplasm of unspecified ovary: Secondary | ICD-10-CM | POA: Diagnosis not present

## 2018-10-02 DIAGNOSIS — Z5111 Encounter for antineoplastic chemotherapy: Secondary | ICD-10-CM | POA: Diagnosis not present

## 2018-10-02 DIAGNOSIS — C569 Malignant neoplasm of unspecified ovary: Secondary | ICD-10-CM | POA: Diagnosis not present

## 2018-10-06 DIAGNOSIS — Z1501 Genetic susceptibility to malignant neoplasm of breast: Secondary | ICD-10-CM | POA: Diagnosis not present

## 2018-10-06 DIAGNOSIS — D701 Agranulocytosis secondary to cancer chemotherapy: Secondary | ICD-10-CM | POA: Diagnosis not present

## 2018-10-06 DIAGNOSIS — T451X5A Adverse effect of antineoplastic and immunosuppressive drugs, initial encounter: Secondary | ICD-10-CM | POA: Diagnosis not present

## 2018-10-06 DIAGNOSIS — Z5111 Encounter for antineoplastic chemotherapy: Secondary | ICD-10-CM | POA: Diagnosis not present

## 2018-10-06 DIAGNOSIS — C569 Malignant neoplasm of unspecified ovary: Secondary | ICD-10-CM | POA: Diagnosis not present

## 2018-10-09 DIAGNOSIS — R82998 Other abnormal findings in urine: Secondary | ICD-10-CM | POA: Diagnosis not present

## 2018-10-09 DIAGNOSIS — C569 Malignant neoplasm of unspecified ovary: Secondary | ICD-10-CM | POA: Diagnosis not present

## 2018-10-13 DIAGNOSIS — Z1501 Genetic susceptibility to malignant neoplasm of breast: Secondary | ICD-10-CM | POA: Diagnosis not present

## 2018-10-13 DIAGNOSIS — C569 Malignant neoplasm of unspecified ovary: Secondary | ICD-10-CM | POA: Diagnosis not present

## 2018-10-13 DIAGNOSIS — Z23 Encounter for immunization: Secondary | ICD-10-CM | POA: Diagnosis not present

## 2018-10-13 DIAGNOSIS — D701 Agranulocytosis secondary to cancer chemotherapy: Secondary | ICD-10-CM | POA: Diagnosis not present

## 2018-10-13 DIAGNOSIS — Z1509 Genetic susceptibility to other malignant neoplasm: Secondary | ICD-10-CM | POA: Diagnosis not present

## 2018-10-13 DIAGNOSIS — T451X5A Adverse effect of antineoplastic and immunosuppressive drugs, initial encounter: Secondary | ICD-10-CM | POA: Diagnosis not present

## 2018-10-13 DIAGNOSIS — Z5111 Encounter for antineoplastic chemotherapy: Secondary | ICD-10-CM | POA: Diagnosis not present

## 2018-10-16 DIAGNOSIS — Z5111 Encounter for antineoplastic chemotherapy: Secondary | ICD-10-CM | POA: Diagnosis not present

## 2018-10-16 DIAGNOSIS — C569 Malignant neoplasm of unspecified ovary: Secondary | ICD-10-CM | POA: Diagnosis not present

## 2018-10-23 DIAGNOSIS — C569 Malignant neoplasm of unspecified ovary: Secondary | ICD-10-CM | POA: Diagnosis not present

## 2018-10-23 DIAGNOSIS — Z5111 Encounter for antineoplastic chemotherapy: Secondary | ICD-10-CM | POA: Diagnosis not present

## 2018-10-27 DIAGNOSIS — C569 Malignant neoplasm of unspecified ovary: Secondary | ICD-10-CM | POA: Diagnosis not present

## 2018-10-27 DIAGNOSIS — Z5111 Encounter for antineoplastic chemotherapy: Secondary | ICD-10-CM | POA: Diagnosis not present

## 2018-10-29 ENCOUNTER — Ambulatory Visit: Payer: Self-pay | Admitting: Family Medicine

## 2018-10-30 DIAGNOSIS — C569 Malignant neoplasm of unspecified ovary: Secondary | ICD-10-CM | POA: Diagnosis not present

## 2018-11-03 DIAGNOSIS — Z1509 Genetic susceptibility to other malignant neoplasm: Secondary | ICD-10-CM | POA: Diagnosis not present

## 2018-11-03 DIAGNOSIS — T451X5A Adverse effect of antineoplastic and immunosuppressive drugs, initial encounter: Secondary | ICD-10-CM | POA: Diagnosis not present

## 2018-11-03 DIAGNOSIS — C569 Malignant neoplasm of unspecified ovary: Secondary | ICD-10-CM | POA: Diagnosis not present

## 2018-11-03 DIAGNOSIS — Z1501 Genetic susceptibility to malignant neoplasm of breast: Secondary | ICD-10-CM | POA: Diagnosis not present

## 2018-11-03 DIAGNOSIS — D701 Agranulocytosis secondary to cancer chemotherapy: Secondary | ICD-10-CM | POA: Diagnosis not present

## 2018-11-03 DIAGNOSIS — Z5111 Encounter for antineoplastic chemotherapy: Secondary | ICD-10-CM | POA: Diagnosis not present

## 2018-11-12 DIAGNOSIS — I509 Heart failure, unspecified: Secondary | ICD-10-CM | POA: Diagnosis not present

## 2018-11-12 DIAGNOSIS — M7989 Other specified soft tissue disorders: Secondary | ICD-10-CM | POA: Diagnosis not present

## 2018-11-12 DIAGNOSIS — B199 Unspecified viral hepatitis without hepatic coma: Secondary | ICD-10-CM | POA: Diagnosis not present

## 2018-11-12 DIAGNOSIS — D696 Thrombocytopenia, unspecified: Secondary | ICD-10-CM | POA: Diagnosis not present

## 2018-11-12 DIAGNOSIS — R110 Nausea: Secondary | ICD-10-CM | POA: Diagnosis not present

## 2018-11-12 DIAGNOSIS — D6481 Anemia due to antineoplastic chemotherapy: Secondary | ICD-10-CM | POA: Diagnosis not present

## 2018-11-12 DIAGNOSIS — R5381 Other malaise: Secondary | ICD-10-CM | POA: Diagnosis not present

## 2018-11-12 DIAGNOSIS — M311 Thrombotic microangiopathy: Secondary | ICD-10-CM | POA: Diagnosis not present

## 2018-11-12 DIAGNOSIS — K7589 Other specified inflammatory liver diseases: Secondary | ICD-10-CM | POA: Diagnosis present

## 2018-11-12 DIAGNOSIS — R531 Weakness: Secondary | ICD-10-CM | POA: Diagnosis not present

## 2018-11-12 DIAGNOSIS — J90 Pleural effusion, not elsewhere classified: Secondary | ICD-10-CM | POA: Diagnosis not present

## 2018-11-12 DIAGNOSIS — G8929 Other chronic pain: Secondary | ICD-10-CM | POA: Diagnosis not present

## 2018-11-12 DIAGNOSIS — R55 Syncope and collapse: Secondary | ICD-10-CM | POA: Diagnosis not present

## 2018-11-12 DIAGNOSIS — I272 Pulmonary hypertension, unspecified: Secondary | ICD-10-CM | POA: Diagnosis not present

## 2018-11-12 DIAGNOSIS — R600 Localized edema: Secondary | ICD-10-CM | POA: Diagnosis not present

## 2018-11-12 DIAGNOSIS — N179 Acute kidney failure, unspecified: Secondary | ICD-10-CM | POA: Diagnosis not present

## 2018-11-12 DIAGNOSIS — R791 Abnormal coagulation profile: Secondary | ICD-10-CM | POA: Diagnosis not present

## 2018-11-12 DIAGNOSIS — T451X5A Adverse effect of antineoplastic and immunosuppressive drugs, initial encounter: Secondary | ICD-10-CM | POA: Diagnosis not present

## 2018-11-12 DIAGNOSIS — C569 Malignant neoplasm of unspecified ovary: Secondary | ICD-10-CM | POA: Diagnosis not present

## 2018-11-12 DIAGNOSIS — I499 Cardiac arrhythmia, unspecified: Secondary | ICD-10-CM | POA: Diagnosis not present

## 2018-11-12 DIAGNOSIS — J9690 Respiratory failure, unspecified, unspecified whether with hypoxia or hypercapnia: Secondary | ICD-10-CM | POA: Diagnosis not present

## 2018-11-12 DIAGNOSIS — C787 Secondary malignant neoplasm of liver and intrahepatic bile duct: Secondary | ICD-10-CM | POA: Diagnosis not present

## 2018-11-12 DIAGNOSIS — Z86718 Personal history of other venous thrombosis and embolism: Secondary | ICD-10-CM | POA: Diagnosis not present

## 2018-11-12 DIAGNOSIS — I444 Left anterior fascicular block: Secondary | ICD-10-CM | POA: Diagnosis not present

## 2018-11-12 DIAGNOSIS — Z743 Need for continuous supervision: Secondary | ICD-10-CM | POA: Diagnosis not present

## 2018-11-12 DIAGNOSIS — Z96652 Presence of left artificial knee joint: Secondary | ICD-10-CM | POA: Diagnosis present

## 2018-11-12 DIAGNOSIS — J9601 Acute respiratory failure with hypoxia: Secondary | ICD-10-CM | POA: Diagnosis not present

## 2018-11-12 DIAGNOSIS — R0902 Hypoxemia: Secondary | ICD-10-CM | POA: Diagnosis not present

## 2018-11-12 DIAGNOSIS — R112 Nausea with vomiting, unspecified: Secondary | ICD-10-CM | POA: Diagnosis not present

## 2018-11-12 DIAGNOSIS — I340 Nonrheumatic mitral (valve) insufficiency: Secondary | ICD-10-CM | POA: Diagnosis not present

## 2018-11-12 DIAGNOSIS — Z90710 Acquired absence of both cervix and uterus: Secondary | ICD-10-CM | POA: Diagnosis not present

## 2018-11-12 DIAGNOSIS — D649 Anemia, unspecified: Secondary | ICD-10-CM | POA: Diagnosis not present

## 2018-11-12 DIAGNOSIS — C786 Secondary malignant neoplasm of retroperitoneum and peritoneum: Secondary | ICD-10-CM | POA: Diagnosis not present

## 2018-11-12 DIAGNOSIS — R0602 Shortness of breath: Secondary | ICD-10-CM | POA: Diagnosis not present

## 2018-11-12 DIAGNOSIS — R7989 Other specified abnormal findings of blood chemistry: Secondary | ICD-10-CM | POA: Diagnosis not present

## 2018-11-12 DIAGNOSIS — I10 Essential (primary) hypertension: Secondary | ICD-10-CM | POA: Diagnosis not present

## 2018-11-12 DIAGNOSIS — R630 Anorexia: Secondary | ICD-10-CM | POA: Diagnosis not present

## 2018-11-14 DIAGNOSIS — Z9889 Other specified postprocedural states: Secondary | ICD-10-CM | POA: Diagnosis not present

## 2018-11-14 DIAGNOSIS — J029 Acute pharyngitis, unspecified: Secondary | ICD-10-CM | POA: Diagnosis not present

## 2018-11-14 DIAGNOSIS — I160 Hypertensive urgency: Secondary | ICD-10-CM | POA: Diagnosis not present

## 2018-11-14 DIAGNOSIS — Z4901 Encounter for fitting and adjustment of extracorporeal dialysis catheter: Secondary | ICD-10-CM | POA: Diagnosis not present

## 2018-11-14 DIAGNOSIS — Z8543 Personal history of malignant neoplasm of ovary: Secondary | ICD-10-CM | POA: Diagnosis not present

## 2018-11-14 DIAGNOSIS — I348 Other nonrheumatic mitral valve disorders: Secondary | ICD-10-CM | POA: Diagnosis not present

## 2018-11-14 DIAGNOSIS — J948 Other specified pleural conditions: Secondary | ICD-10-CM | POA: Diagnosis not present

## 2018-11-14 DIAGNOSIS — R0600 Dyspnea, unspecified: Secondary | ICD-10-CM | POA: Diagnosis not present

## 2018-11-14 DIAGNOSIS — T380X5A Adverse effect of glucocorticoids and synthetic analogues, initial encounter: Secondary | ICD-10-CM | POA: Diagnosis not present

## 2018-11-14 DIAGNOSIS — M6281 Muscle weakness (generalized): Secondary | ICD-10-CM | POA: Diagnosis not present

## 2018-11-14 DIAGNOSIS — T451X5A Adverse effect of antineoplastic and immunosuppressive drugs, initial encounter: Secondary | ICD-10-CM | POA: Diagnosis present

## 2018-11-14 DIAGNOSIS — R601 Generalized edema: Secondary | ICD-10-CM | POA: Diagnosis not present

## 2018-11-14 DIAGNOSIS — R0989 Other specified symptoms and signs involving the circulatory and respiratory systems: Secondary | ICD-10-CM | POA: Diagnosis not present

## 2018-11-14 DIAGNOSIS — I361 Nonrheumatic tricuspid (valve) insufficiency: Secondary | ICD-10-CM | POA: Diagnosis not present

## 2018-11-14 DIAGNOSIS — C799 Secondary malignant neoplasm of unspecified site: Secondary | ICD-10-CM | POA: Diagnosis present

## 2018-11-14 DIAGNOSIS — Z6827 Body mass index (BMI) 27.0-27.9, adult: Secondary | ICD-10-CM | POA: Diagnosis not present

## 2018-11-14 DIAGNOSIS — I161 Hypertensive emergency: Secondary | ICD-10-CM | POA: Diagnosis not present

## 2018-11-14 DIAGNOSIS — M47819 Spondylosis without myelopathy or radiculopathy, site unspecified: Secondary | ICD-10-CM | POA: Diagnosis not present

## 2018-11-14 DIAGNOSIS — I519 Heart disease, unspecified: Secondary | ICD-10-CM | POA: Diagnosis not present

## 2018-11-14 DIAGNOSIS — D6481 Anemia due to antineoplastic chemotherapy: Secondary | ICD-10-CM | POA: Diagnosis present

## 2018-11-14 DIAGNOSIS — M311 Thrombotic microangiopathy: Secondary | ICD-10-CM | POA: Diagnosis not present

## 2018-11-14 DIAGNOSIS — D599 Acquired hemolytic anemia, unspecified: Secondary | ICD-10-CM | POA: Diagnosis not present

## 2018-11-14 DIAGNOSIS — D696 Thrombocytopenia, unspecified: Secondary | ICD-10-CM | POA: Diagnosis not present

## 2018-11-14 DIAGNOSIS — J90 Pleural effusion, not elsewhere classified: Secondary | ICD-10-CM | POA: Diagnosis not present

## 2018-11-14 DIAGNOSIS — I358 Other nonrheumatic aortic valve disorders: Secondary | ICD-10-CM | POA: Diagnosis not present

## 2018-11-14 DIAGNOSIS — Z79899 Other long term (current) drug therapy: Secondary | ICD-10-CM | POA: Diagnosis not present

## 2018-11-14 DIAGNOSIS — N179 Acute kidney failure, unspecified: Secondary | ICD-10-CM | POA: Diagnosis not present

## 2018-11-14 DIAGNOSIS — I371 Nonrheumatic pulmonary valve insufficiency: Secondary | ICD-10-CM | POA: Diagnosis not present

## 2018-11-14 DIAGNOSIS — C569 Malignant neoplasm of unspecified ovary: Secondary | ICD-10-CM | POA: Diagnosis not present

## 2018-11-14 DIAGNOSIS — R718 Other abnormality of red blood cells: Secondary | ICD-10-CM | POA: Diagnosis not present

## 2018-11-14 DIAGNOSIS — E440 Moderate protein-calorie malnutrition: Secondary | ICD-10-CM | POA: Diagnosis not present

## 2018-11-14 DIAGNOSIS — I340 Nonrheumatic mitral (valve) insufficiency: Secondary | ICD-10-CM | POA: Diagnosis not present

## 2018-11-14 DIAGNOSIS — I10 Essential (primary) hypertension: Secondary | ICD-10-CM | POA: Diagnosis not present

## 2018-11-14 DIAGNOSIS — R739 Hyperglycemia, unspecified: Secondary | ICD-10-CM | POA: Diagnosis not present

## 2018-11-14 DIAGNOSIS — J9811 Atelectasis: Secondary | ICD-10-CM | POA: Diagnosis not present

## 2018-11-14 DIAGNOSIS — I517 Cardiomegaly: Secondary | ICD-10-CM | POA: Diagnosis not present

## 2018-11-14 DIAGNOSIS — Z23 Encounter for immunization: Secondary | ICD-10-CM | POA: Diagnosis not present

## 2018-11-14 DIAGNOSIS — K429 Umbilical hernia without obstruction or gangrene: Secondary | ICD-10-CM | POA: Diagnosis not present

## 2018-11-28 DIAGNOSIS — Z6831 Body mass index (BMI) 31.0-31.9, adult: Secondary | ICD-10-CM | POA: Diagnosis not present

## 2018-11-28 DIAGNOSIS — C569 Malignant neoplasm of unspecified ovary: Secondary | ICD-10-CM | POA: Diagnosis not present

## 2018-11-28 DIAGNOSIS — Z79899 Other long term (current) drug therapy: Secondary | ICD-10-CM | POA: Diagnosis not present

## 2018-11-28 DIAGNOSIS — M311 Thrombotic microangiopathy: Secondary | ICD-10-CM | POA: Diagnosis not present

## 2018-11-29 DIAGNOSIS — N179 Acute kidney failure, unspecified: Secondary | ICD-10-CM | POA: Diagnosis not present

## 2018-11-30 DIAGNOSIS — F419 Anxiety disorder, unspecified: Secondary | ICD-10-CM | POA: Diagnosis not present

## 2018-11-30 DIAGNOSIS — E440 Moderate protein-calorie malnutrition: Secondary | ICD-10-CM | POA: Diagnosis not present

## 2018-11-30 DIAGNOSIS — Z90711 Acquired absence of uterus with remaining cervical stump: Secondary | ICD-10-CM | POA: Diagnosis not present

## 2018-11-30 DIAGNOSIS — J90 Pleural effusion, not elsewhere classified: Secondary | ICD-10-CM | POA: Diagnosis not present

## 2018-11-30 DIAGNOSIS — Z8541 Personal history of malignant neoplasm of cervix uteri: Secondary | ICD-10-CM | POA: Diagnosis not present

## 2018-11-30 DIAGNOSIS — Z9049 Acquired absence of other specified parts of digestive tract: Secondary | ICD-10-CM | POA: Diagnosis not present

## 2018-11-30 DIAGNOSIS — C569 Malignant neoplasm of unspecified ovary: Secondary | ICD-10-CM | POA: Diagnosis not present

## 2018-11-30 DIAGNOSIS — Z8542 Personal history of malignant neoplasm of other parts of uterus: Secondary | ICD-10-CM | POA: Diagnosis not present

## 2018-11-30 DIAGNOSIS — D599 Acquired hemolytic anemia, unspecified: Secondary | ICD-10-CM | POA: Diagnosis not present

## 2018-11-30 DIAGNOSIS — Z86718 Personal history of other venous thrombosis and embolism: Secondary | ICD-10-CM | POA: Diagnosis not present

## 2018-11-30 DIAGNOSIS — Z96659 Presence of unspecified artificial knee joint: Secondary | ICD-10-CM | POA: Diagnosis not present

## 2018-11-30 DIAGNOSIS — M311 Thrombotic microangiopathy: Secondary | ICD-10-CM | POA: Diagnosis not present

## 2018-11-30 DIAGNOSIS — G4700 Insomnia, unspecified: Secondary | ICD-10-CM | POA: Diagnosis not present

## 2018-11-30 DIAGNOSIS — N179 Acute kidney failure, unspecified: Secondary | ICD-10-CM | POA: Diagnosis not present

## 2018-11-30 DIAGNOSIS — N390 Urinary tract infection, site not specified: Secondary | ICD-10-CM | POA: Diagnosis not present

## 2018-11-30 DIAGNOSIS — Z7952 Long term (current) use of systemic steroids: Secondary | ICD-10-CM | POA: Diagnosis not present

## 2018-11-30 DIAGNOSIS — Z90722 Acquired absence of ovaries, bilateral: Secondary | ICD-10-CM | POA: Diagnosis not present

## 2018-11-30 DIAGNOSIS — N186 End stage renal disease: Secondary | ICD-10-CM | POA: Diagnosis not present

## 2018-11-30 DIAGNOSIS — Z792 Long term (current) use of antibiotics: Secondary | ICD-10-CM | POA: Diagnosis not present

## 2018-11-30 DIAGNOSIS — D630 Anemia in neoplastic disease: Secondary | ICD-10-CM | POA: Diagnosis not present

## 2018-11-30 DIAGNOSIS — Z992 Dependence on renal dialysis: Secondary | ICD-10-CM | POA: Diagnosis not present

## 2018-11-30 DIAGNOSIS — D631 Anemia in chronic kidney disease: Secondary | ICD-10-CM | POA: Diagnosis not present

## 2018-11-30 DIAGNOSIS — E785 Hyperlipidemia, unspecified: Secondary | ICD-10-CM | POA: Diagnosis not present

## 2018-11-30 DIAGNOSIS — J302 Other seasonal allergic rhinitis: Secondary | ICD-10-CM | POA: Diagnosis not present

## 2018-11-30 DIAGNOSIS — I120 Hypertensive chronic kidney disease with stage 5 chronic kidney disease or end stage renal disease: Secondary | ICD-10-CM | POA: Diagnosis not present

## 2018-12-01 ENCOUNTER — Telehealth: Payer: Self-pay

## 2018-12-01 DIAGNOSIS — M311 Thrombotic microangiopathy: Secondary | ICD-10-CM | POA: Diagnosis not present

## 2018-12-01 DIAGNOSIS — N179 Acute kidney failure, unspecified: Secondary | ICD-10-CM | POA: Diagnosis not present

## 2018-12-01 NOTE — Telephone Encounter (Signed)
Please review. Thanks!  

## 2018-12-01 NOTE — Telephone Encounter (Signed)
Nancy Downs with [facility] will be faxing a order to be signed. Patient started [name] with [facility] yesterday.

## 2018-12-02 DIAGNOSIS — R0902 Hypoxemia: Secondary | ICD-10-CM | POA: Diagnosis not present

## 2018-12-02 DIAGNOSIS — Z992 Dependence on renal dialysis: Secondary | ICD-10-CM | POA: Diagnosis not present

## 2018-12-02 DIAGNOSIS — D594 Other nonautoimmune hemolytic anemias: Secondary | ICD-10-CM | POA: Diagnosis not present

## 2018-12-02 DIAGNOSIS — M311 Thrombotic microangiopathy: Secondary | ICD-10-CM | POA: Diagnosis not present

## 2018-12-02 DIAGNOSIS — R110 Nausea: Secondary | ICD-10-CM | POA: Diagnosis not present

## 2018-12-02 DIAGNOSIS — D649 Anemia, unspecified: Secondary | ICD-10-CM | POA: Diagnosis not present

## 2018-12-02 DIAGNOSIS — D590 Drug-induced autoimmune hemolytic anemia: Secondary | ICD-10-CM | POA: Diagnosis not present

## 2018-12-02 DIAGNOSIS — K254 Chronic or unspecified gastric ulcer with hemorrhage: Secondary | ICD-10-CM | POA: Diagnosis not present

## 2018-12-02 DIAGNOSIS — C569 Malignant neoplasm of unspecified ovary: Secondary | ICD-10-CM | POA: Diagnosis not present

## 2018-12-02 DIAGNOSIS — Z6830 Body mass index (BMI) 30.0-30.9, adult: Secondary | ICD-10-CM | POA: Diagnosis not present

## 2018-12-02 DIAGNOSIS — N179 Acute kidney failure, unspecified: Secondary | ICD-10-CM | POA: Diagnosis not present

## 2018-12-02 DIAGNOSIS — N178 Other acute kidney failure: Secondary | ICD-10-CM | POA: Diagnosis not present

## 2018-12-02 DIAGNOSIS — R112 Nausea with vomiting, unspecified: Secondary | ICD-10-CM | POA: Diagnosis not present

## 2018-12-02 DIAGNOSIS — I959 Hypotension, unspecified: Secondary | ICD-10-CM | POA: Diagnosis not present

## 2018-12-02 DIAGNOSIS — R0689 Other abnormalities of breathing: Secondary | ICD-10-CM | POA: Diagnosis not present

## 2018-12-02 DIAGNOSIS — R42 Dizziness and giddiness: Secondary | ICD-10-CM | POA: Diagnosis not present

## 2018-12-03 DIAGNOSIS — C569 Malignant neoplasm of unspecified ovary: Secondary | ICD-10-CM | POA: Diagnosis not present

## 2018-12-03 DIAGNOSIS — D649 Anemia, unspecified: Secondary | ICD-10-CM | POA: Diagnosis not present

## 2018-12-03 DIAGNOSIS — R233 Spontaneous ecchymoses: Secondary | ICD-10-CM | POA: Diagnosis not present

## 2018-12-03 DIAGNOSIS — D590 Drug-induced autoimmune hemolytic anemia: Secondary | ICD-10-CM | POA: Diagnosis not present

## 2018-12-03 DIAGNOSIS — M311 Thrombotic microangiopathy: Secondary | ICD-10-CM | POA: Diagnosis not present

## 2018-12-03 DIAGNOSIS — N178 Other acute kidney failure: Secondary | ICD-10-CM | POA: Diagnosis not present

## 2018-12-04 DIAGNOSIS — J9811 Atelectasis: Secondary | ICD-10-CM | POA: Diagnosis not present

## 2018-12-04 DIAGNOSIS — Z992 Dependence on renal dialysis: Secondary | ICD-10-CM | POA: Diagnosis not present

## 2018-12-04 DIAGNOSIS — D62 Acute posthemorrhagic anemia: Secondary | ICD-10-CM | POA: Diagnosis not present

## 2018-12-04 DIAGNOSIS — K921 Melena: Secondary | ICD-10-CM | POA: Diagnosis not present

## 2018-12-04 DIAGNOSIS — N179 Acute kidney failure, unspecified: Secondary | ICD-10-CM | POA: Diagnosis not present

## 2018-12-04 DIAGNOSIS — D599 Acquired hemolytic anemia, unspecified: Secondary | ICD-10-CM | POA: Diagnosis not present

## 2018-12-04 DIAGNOSIS — R918 Other nonspecific abnormal finding of lung field: Secondary | ICD-10-CM | POA: Diagnosis not present

## 2018-12-04 DIAGNOSIS — J90 Pleural effusion, not elsewhere classified: Secondary | ICD-10-CM | POA: Diagnosis not present

## 2018-12-04 DIAGNOSIS — K8689 Other specified diseases of pancreas: Secondary | ICD-10-CM | POA: Diagnosis not present

## 2018-12-04 DIAGNOSIS — D594 Other nonautoimmune hemolytic anemias: Secondary | ICD-10-CM | POA: Diagnosis present

## 2018-12-04 DIAGNOSIS — D1803 Hemangioma of intra-abdominal structures: Secondary | ICD-10-CM | POA: Diagnosis not present

## 2018-12-04 DIAGNOSIS — D631 Anemia in chronic kidney disease: Secondary | ICD-10-CM | POA: Diagnosis not present

## 2018-12-04 DIAGNOSIS — K625 Hemorrhage of anus and rectum: Secondary | ICD-10-CM | POA: Diagnosis not present

## 2018-12-04 DIAGNOSIS — R188 Other ascites: Secondary | ICD-10-CM | POA: Diagnosis not present

## 2018-12-04 DIAGNOSIS — E8770 Fluid overload, unspecified: Secondary | ICD-10-CM | POA: Diagnosis not present

## 2018-12-04 DIAGNOSIS — K259 Gastric ulcer, unspecified as acute or chronic, without hemorrhage or perforation: Secondary | ICD-10-CM | POA: Diagnosis not present

## 2018-12-04 DIAGNOSIS — I120 Hypertensive chronic kidney disease with stage 5 chronic kidney disease or end stage renal disease: Secondary | ICD-10-CM | POA: Diagnosis not present

## 2018-12-04 DIAGNOSIS — D649 Anemia, unspecified: Secondary | ICD-10-CM | POA: Diagnosis not present

## 2018-12-04 DIAGNOSIS — K648 Other hemorrhoids: Secondary | ICD-10-CM | POA: Diagnosis not present

## 2018-12-04 DIAGNOSIS — M311 Thrombotic microangiopathy: Secondary | ICD-10-CM | POA: Diagnosis not present

## 2018-12-04 DIAGNOSIS — T451X5A Adverse effect of antineoplastic and immunosuppressive drugs, initial encounter: Secondary | ICD-10-CM | POA: Diagnosis present

## 2018-12-04 DIAGNOSIS — C569 Malignant neoplasm of unspecified ovary: Secondary | ICD-10-CM | POA: Diagnosis not present

## 2018-12-04 DIAGNOSIS — D500 Iron deficiency anemia secondary to blood loss (chronic): Secondary | ICD-10-CM | POA: Diagnosis not present

## 2018-12-04 DIAGNOSIS — N186 End stage renal disease: Secondary | ICD-10-CM | POA: Diagnosis not present

## 2018-12-04 DIAGNOSIS — N178 Other acute kidney failure: Secondary | ICD-10-CM | POA: Diagnosis not present

## 2018-12-04 DIAGNOSIS — K254 Chronic or unspecified gastric ulcer with hemorrhage: Secondary | ICD-10-CM | POA: Diagnosis present

## 2018-12-04 DIAGNOSIS — D590 Drug-induced autoimmune hemolytic anemia: Secondary | ICD-10-CM | POA: Diagnosis not present

## 2018-12-04 DIAGNOSIS — K922 Gastrointestinal hemorrhage, unspecified: Secondary | ICD-10-CM | POA: Diagnosis not present

## 2018-12-09 MED ORDER — SENNOSIDES 8.6 MG PO TABS
1.00 | ORAL_TABLET | ORAL | Status: DC
Start: 2018-12-09 — End: 2018-12-09

## 2018-12-09 MED ORDER — MORPHINE SULFATE ER 15 MG PO TBCR
15.00 | EXTENDED_RELEASE_TABLET | ORAL | Status: DC
Start: 2018-12-09 — End: 2018-12-09

## 2018-12-09 MED ORDER — MAGNESIUM OXIDE 400 MG PO TABS
400.00 | ORAL_TABLET | ORAL | Status: DC
Start: 2018-12-09 — End: 2018-12-09

## 2018-12-09 MED ORDER — PANTOPRAZOLE SODIUM 40 MG IV SOLR
40.00 | INTRAVENOUS | Status: DC
Start: 2018-12-09 — End: 2018-12-09

## 2018-12-09 MED ORDER — OXYCODONE-ACETAMINOPHEN 5-325 MG PO TABS
1.00 | ORAL_TABLET | ORAL | Status: DC
Start: ? — End: 2018-12-09

## 2018-12-09 MED ORDER — DOCUSATE SODIUM 100 MG PO CAPS
100.00 | ORAL_CAPSULE | ORAL | Status: DC
Start: 2018-12-09 — End: 2018-12-09

## 2018-12-09 MED ORDER — FUROSEMIDE 10 MG/ML IJ SOLN
80.00 | INTRAMUSCULAR | Status: DC
Start: 2018-12-09 — End: 2018-12-09

## 2018-12-09 MED ORDER — MONTELUKAST SODIUM 10 MG PO TABS
10.00 | ORAL_TABLET | ORAL | Status: DC
Start: 2018-12-10 — End: 2018-12-09

## 2018-12-09 MED ORDER — GENERIC EXTERNAL MEDICATION
1.80 | Status: DC
Start: ? — End: 2018-12-09

## 2018-12-09 MED ORDER — ALPRAZOLAM 0.5 MG PO TABS
.50 | ORAL_TABLET | ORAL | Status: DC
Start: ? — End: 2018-12-09

## 2018-12-09 MED ORDER — ACETAMINOPHEN 325 MG PO TABS
650.00 | ORAL_TABLET | ORAL | Status: DC
Start: ? — End: 2018-12-09

## 2018-12-09 MED ORDER — GENERIC EXTERNAL MEDICATION
Status: DC
Start: ? — End: 2018-12-09

## 2018-12-09 MED ORDER — MELATONIN 3 MG PO TABS
6.00 | ORAL_TABLET | ORAL | Status: DC
Start: ? — End: 2018-12-09

## 2018-12-09 MED ORDER — POLYETHYLENE GLYCOL 3350 17 G PO PACK
17.00 | PACK | ORAL | Status: DC
Start: 2018-12-10 — End: 2018-12-09

## 2018-12-09 MED ORDER — CITALOPRAM HYDROBROMIDE 20 MG PO TABS
20.00 | ORAL_TABLET | ORAL | Status: DC
Start: 2018-12-10 — End: 2018-12-09

## 2018-12-09 MED ORDER — ZOLPIDEM TARTRATE 5 MG PO TABS
5.00 | ORAL_TABLET | ORAL | Status: DC
Start: 2018-12-09 — End: 2018-12-09

## 2018-12-09 MED ORDER — HYDRALAZINE HCL 50 MG PO TABS
50.00 | ORAL_TABLET | ORAL | Status: DC
Start: 2018-12-09 — End: 2018-12-09

## 2018-12-09 MED ORDER — GENERIC EXTERNAL MEDICATION
60.00 | Status: DC
Start: ? — End: 2018-12-09

## 2018-12-09 MED ORDER — DERMACERIN EX CREA
TOPICAL_CREAM | CUTANEOUS | Status: DC
Start: 2018-12-09 — End: 2018-12-09

## 2018-12-09 MED ORDER — LABETALOL HCL 200 MG PO TABS
400.00 | ORAL_TABLET | ORAL | Status: DC
Start: 2018-12-09 — End: 2018-12-09

## 2018-12-09 MED ORDER — ONDANSETRON 4 MG PO TBDP
8.00 | ORAL_TABLET | ORAL | Status: DC
Start: ? — End: 2018-12-09

## 2018-12-09 MED ORDER — AMLODIPINE BESYLATE 10 MG PO TABS
10.00 | ORAL_TABLET | ORAL | Status: DC
Start: 2018-12-10 — End: 2018-12-09

## 2018-12-09 MED ORDER — LIDOCAINE 5 % EX OINT
TOPICAL_OINTMENT | CUTANEOUS | Status: DC
Start: ? — End: 2018-12-09

## 2018-12-09 MED ORDER — FOLIC ACID 1 MG PO TABS
1.00 | ORAL_TABLET | ORAL | Status: DC
Start: 2018-12-10 — End: 2018-12-09

## 2018-12-11 ENCOUNTER — Other Ambulatory Visit: Payer: Self-pay | Admitting: Family Medicine

## 2018-12-11 DIAGNOSIS — M311 Thrombotic microangiopathy: Secondary | ICD-10-CM | POA: Diagnosis not present

## 2018-12-11 DIAGNOSIS — N179 Acute kidney failure, unspecified: Secondary | ICD-10-CM | POA: Diagnosis not present

## 2018-12-11 DIAGNOSIS — R319 Hematuria, unspecified: Secondary | ICD-10-CM | POA: Diagnosis not present

## 2018-12-11 DIAGNOSIS — K259 Gastric ulcer, unspecified as acute or chronic, without hemorrhage or perforation: Secondary | ICD-10-CM | POA: Diagnosis not present

## 2018-12-11 DIAGNOSIS — D509 Iron deficiency anemia, unspecified: Secondary | ICD-10-CM | POA: Diagnosis not present

## 2018-12-11 DIAGNOSIS — N2581 Secondary hyperparathyroidism of renal origin: Secondary | ICD-10-CM | POA: Diagnosis not present

## 2018-12-11 DIAGNOSIS — Z6832 Body mass index (BMI) 32.0-32.9, adult: Secondary | ICD-10-CM | POA: Diagnosis not present

## 2018-12-12 DIAGNOSIS — D593 Hemolytic-uremic syndrome: Secondary | ICD-10-CM | POA: Diagnosis not present

## 2018-12-13 DIAGNOSIS — N2581 Secondary hyperparathyroidism of renal origin: Secondary | ICD-10-CM | POA: Diagnosis not present

## 2018-12-13 DIAGNOSIS — D509 Iron deficiency anemia, unspecified: Secondary | ICD-10-CM | POA: Diagnosis not present

## 2018-12-13 DIAGNOSIS — N179 Acute kidney failure, unspecified: Secondary | ICD-10-CM | POA: Diagnosis not present

## 2018-12-16 DIAGNOSIS — D509 Iron deficiency anemia, unspecified: Secondary | ICD-10-CM | POA: Diagnosis not present

## 2018-12-16 DIAGNOSIS — N2581 Secondary hyperparathyroidism of renal origin: Secondary | ICD-10-CM | POA: Diagnosis not present

## 2018-12-16 DIAGNOSIS — N179 Acute kidney failure, unspecified: Secondary | ICD-10-CM | POA: Diagnosis not present

## 2018-12-17 ENCOUNTER — Ambulatory Visit (INDEPENDENT_AMBULATORY_CARE_PROVIDER_SITE_OTHER): Payer: Medicare Other | Admitting: Family Medicine

## 2018-12-17 ENCOUNTER — Encounter: Payer: Self-pay | Admitting: Family Medicine

## 2018-12-17 VITALS — BP 142/72 | HR 80 | Temp 98.2°F | Resp 16 | Wt 197.0 lb

## 2018-12-17 DIAGNOSIS — C569 Malignant neoplasm of unspecified ovary: Secondary | ICD-10-CM

## 2018-12-17 DIAGNOSIS — N179 Acute kidney failure, unspecified: Secondary | ICD-10-CM | POA: Diagnosis not present

## 2018-12-17 DIAGNOSIS — C787 Secondary malignant neoplasm of liver and intrahepatic bile duct: Secondary | ICD-10-CM

## 2018-12-17 DIAGNOSIS — K922 Gastrointestinal hemorrhage, unspecified: Secondary | ICD-10-CM

## 2018-12-17 DIAGNOSIS — H109 Unspecified conjunctivitis: Secondary | ICD-10-CM | POA: Diagnosis not present

## 2018-12-17 MED ORDER — GENTAMICIN SULFATE 0.3 % OP SOLN: 1.0000 [drp] | Freq: Three times a day (TID) | OPHTHALMIC | 0 refills | Status: AC

## 2018-12-17 NOTE — Progress Notes (Signed)
Patient: Nancy Downs Female    DOB: 02-05-1936   83 y.o.   MRN: [id number] Visit Date: 12/17/2018  Today's Provider: Wilhemena Durie, MD   Chief Complaint  Patient presents with  . Follow-up   Subjective:     HPI  Patient comes in today for a follow up. She was last seen in the office 6 months ago. Since then, she has started on dialysis 3x a week. She has also started on amlodipine 10mg  daily and reports that she has tolerated well.  She had a GI bleed and is slowlt starting to feel better. I still have not seen a discharge summary for this admission/ She also mentions that she needs a refill on alprazolam and Ambien. She has taken this for several years, and it has worked well with her anxiety and insomnia.   Patient has also had to start Protonix due to 2 bleeding ulcers. She reports that she still does not have a GI follow up yet.   Allergies  Allergen Reactions  . Sodium Tetradecyl Sulfate Shortness Of Breath    SOB, coughing, rash neck/chest      Current Outpatient Medications:  .  ALPRAZolam (XANAX) 0.5 MG tablet, TAKE ONE TABLET BY MOUTH EVERY EIGHT HOURS AS NEEDED , Disp: 180 tablet, Rfl: 0 .  amLODipine (NORVASC) 10 MG tablet, Take 1 tablet by mouth daily., Disp: , Rfl:  .  amoxicillin (AMOXIL) 500 MG capsule, Take 1 capsule by mouth 2 (two) times daily., Disp: , Rfl:  .  calcium carbonate (OS-CAL) 600 MG TABS tablet, Take 600 mg by mouth 2 (two) times daily with a meal., Disp: , Rfl:  .  citalopram (CELEXA) 20 MG tablet, TAKE ONE TABLET BY MOUTH EVERY MORNING, Disp: 90 tablet, Rfl: 3 .  hydrALAZINE (APRESOLINE) 50 MG tablet, Take 1 tablet by mouth every 6 (six) hours., Disp: , Rfl:  .  HYDROcodone-acetaminophen (NORCO) 10-325 MG tablet, Take 0.5 tablets by mouth 2 (two) times daily as needed. , Disp: , Rfl: 0 .  labetalol (NORMODYNE) 200 MG tablet, Take 1 tablet by mouth 2 (two) times daily., Disp: , Rfl:  .  loratadine (CLARITIN) 10 MG tablet, Take 10 mg by  mouth daily., Disp: , Rfl:  .  montelukast (SINGULAIR) 10 MG tablet, TAKE ONE TABLET BY MOUTH EVERY DAY, Disp: 90 tablet, Rfl: 3 .  ondansetron (ZOFRAN) 4 MG tablet, Take 4 mg by mouth every 8 (eight) hours as needed for nausea or vomiting., Disp: , Rfl:  .  oxyCODONE-acetaminophen (PERCOCET/ROXICET) 5-325 MG tablet, Take 1 tablet by mouth every 4 (four) hours as needed. , Disp: , Rfl:  .  pantoprazole (PROTONIX) 40 MG tablet, Take 1 tablet by mouth 2 (two) times daily., Disp: , Rfl:  .  predniSONE (DELTASONE) 10 MG tablet, Take 1 tablet by mouth. Taper as directed., Disp: , Rfl:  .  rosuvastatin (CRESTOR) 10 MG tablet, Take 1 tablet (10 mg total) by mouth daily., Disp: 30 tablet, Rfl: 12 .  sennosides-docusate sodium (SENOKOT-S) 8.6-50 MG tablet, Take 2 tablets by mouth 2 (two) times daily as needed for constipation. For Constipation  , Disp: , Rfl:  .  zolpidem (AMBIEN) 10 MG tablet, TAKE ONE TABLET BY MOUTH AT BEDTIME , Disp: 90 tablet, Rfl: 0 .  zolpidem (AMBIEN) 5 MG tablet, Take 2 tablets (10 mg total) by mouth at bedtime., Disp: 90 tablet, Rfl: 0 .  aspirin 325 MG tablet, Take 325 mg by  mouth daily., Disp: , Rfl:  .  ASPIRIN 81 PO, Take daily by mouth., Disp: , Rfl:  .  celecoxib (CELEBREX) 200 MG capsule, Take 1 capsule (200 mg total) by mouth daily. (Patient not taking: Reported on 12/17/2018), Disp: 30 capsule, Rfl: 3 .  HYDROcodone-homatropine (HYCODAN) 5-1.5 MG/5ML syrup, Take 5 mLs by mouth at bedtime as needed for cough. (Patient not taking: Reported on 03/11/2018), Disp: 120 mL, Rfl: 0 .  Vitamin D, Ergocalciferol, (DRISDOL) 50000 units CAPS capsule, TAKE 1 CAPSULE BY MOUTH EVERY WEEK ON TUESDAYS (Patient not taking: Reported on 12/17/2018), Disp: 12 capsule, Rfl: 3  Review of Systems  Constitutional: Positive for activity change and fatigue.  HENT: Negative.   Respiratory: Negative for cough and shortness of breath.   Cardiovascular: Positive for leg swelling. Negative for chest pain  and palpitations.  Musculoskeletal: Positive for arthralgias, gait problem and myalgias.  Skin: Negative.   Allergic/Immunologic: Positive for environmental allergies.  Neurological: Positive for weakness. Negative for dizziness, light-headedness and headaches.  Psychiatric/Behavioral: Positive for agitation and sleep disturbance. Negative for self-injury and suicidal ideas. The patient is nervous/anxious.     Social History   Tobacco Use  . Smoking status: Never Smoker  . Smokeless tobacco: Never Used  Substance Use Topics  . Alcohol use: Yes    Comment: occasionally      Objective:   BP (!) 142/72 (BP Location: Left Arm, Patient Position: Sitting, Cuff Size: Large)   Pulse 80   Temp 98.2 F (36.8 C)   Resp 16   Wt 197 lb (89.4 kg)   SpO2 94%   BMI 30.85 kg/m  Vitals:   12/17/18 1104  BP: (!) 142/72  Pulse: 80  Resp: 16  Temp: 98.2 F (36.8 C)  SpO2: 94%  Weight: 197 lb (89.4 kg)     Physical Exam Constitutional:      Appearance: Normal appearance. She is well-developed.  HENT:     Head: Normocephalic and atraumatic.     Right Ear: External ear normal.     Left Ear: External ear normal.     Nose: Nose normal.     Mouth/Throat:     Pharynx: Oropharynx is clear.  Eyes:     General: No scleral icterus.       Right eye: Discharge present.        Left eye: Discharge present.    Conjunctiva/sclera: Conjunctivae normal.  Neck:     Thyroid: No thyromegaly.  Cardiovascular:     Rate and Rhythm: Normal rate and regular rhythm.     Heart sounds: Normal heart sounds.  Pulmonary:     Effort: Pulmonary effort is normal.     Breath sounds: Normal breath sounds.  Abdominal:     Palpations: Abdomen is soft.  Skin:    General: Skin is warm and dry.  Neurological:     General: No focal deficit present.     Mental Status: She is alert and oriented to person, place, and time. Mental status is at baseline.  Psychiatric:        Behavior: Behavior normal.         Thought Content: Thought content normal.        Judgment: Judgment normal.         Assessment & Plan     1. Conjunctivitis of both eyes, unspecified conjunctivitis type  - gentamicin (GARAMYCIN) 0.3 % ophthalmic solution; Place 1 drop into both eyes 3 (three) times daily.  Dispense: 5 mL; Refill:  0  2. Upper GI bleed No NSAID use.Clinically stable.Improving.  3. Secondary malignant neoplasm of liver ([place])   4. Primary malignant neoplasm of ovary, unspecified laterality ([place]) Followed at [facility]. 5.Chronic Renal Failure On hemodialysis.   I have done the exam and reviewed the above chart and it is accurate to the best of my knowledge. Development worker, community has been used in this note in any air is in the dictation or transcription are unintentional.  Wilhemena Durie, MD  [place]

## 2018-12-18 DIAGNOSIS — D509 Iron deficiency anemia, unspecified: Secondary | ICD-10-CM | POA: Diagnosis not present

## 2018-12-18 DIAGNOSIS — N2581 Secondary hyperparathyroidism of renal origin: Secondary | ICD-10-CM | POA: Diagnosis not present

## 2018-12-18 DIAGNOSIS — N179 Acute kidney failure, unspecified: Secondary | ICD-10-CM | POA: Diagnosis not present

## 2018-12-19 DIAGNOSIS — M311 Thrombotic microangiopathy: Secondary | ICD-10-CM | POA: Diagnosis not present

## 2018-12-19 DIAGNOSIS — N186 End stage renal disease: Secondary | ICD-10-CM | POA: Diagnosis not present

## 2018-12-19 DIAGNOSIS — J810 Acute pulmonary edema: Secondary | ICD-10-CM | POA: Diagnosis not present

## 2018-12-19 DIAGNOSIS — C787 Secondary malignant neoplasm of liver and intrahepatic bile duct: Secondary | ICD-10-CM | POA: Diagnosis not present

## 2018-12-19 DIAGNOSIS — I499 Cardiac arrhythmia, unspecified: Secondary | ICD-10-CM | POA: Diagnosis not present

## 2018-12-19 DIAGNOSIS — J811 Chronic pulmonary edema: Secondary | ICD-10-CM | POA: Diagnosis not present

## 2018-12-19 DIAGNOSIS — N179 Acute kidney failure, unspecified: Secondary | ICD-10-CM | POA: Diagnosis not present

## 2018-12-19 DIAGNOSIS — R0902 Hypoxemia: Secondary | ICD-10-CM | POA: Diagnosis not present

## 2018-12-19 DIAGNOSIS — C569 Malignant neoplasm of unspecified ovary: Secondary | ICD-10-CM | POA: Diagnosis not present

## 2018-12-19 DIAGNOSIS — I120 Hypertensive chronic kidney disease with stage 5 chronic kidney disease or end stage renal disease: Secondary | ICD-10-CM | POA: Diagnosis not present

## 2018-12-20 DIAGNOSIS — D509 Iron deficiency anemia, unspecified: Secondary | ICD-10-CM | POA: Diagnosis not present

## 2018-12-20 DIAGNOSIS — N179 Acute kidney failure, unspecified: Secondary | ICD-10-CM | POA: Diagnosis not present

## 2018-12-20 DIAGNOSIS — N2581 Secondary hyperparathyroidism of renal origin: Secondary | ICD-10-CM | POA: Diagnosis not present

## 2018-12-22 DIAGNOSIS — Z90710 Acquired absence of both cervix and uterus: Secondary | ICD-10-CM | POA: Diagnosis not present

## 2018-12-22 DIAGNOSIS — J811 Chronic pulmonary edema: Secondary | ICD-10-CM | POA: Diagnosis not present

## 2018-12-22 DIAGNOSIS — N179 Acute kidney failure, unspecified: Secondary | ICD-10-CM | POA: Diagnosis not present

## 2018-12-22 DIAGNOSIS — Z992 Dependence on renal dialysis: Secondary | ICD-10-CM | POA: Diagnosis not present

## 2018-12-22 DIAGNOSIS — J810 Acute pulmonary edema: Secondary | ICD-10-CM | POA: Diagnosis present

## 2018-12-22 DIAGNOSIS — M7989 Other specified soft tissue disorders: Secondary | ICD-10-CM | POA: Diagnosis not present

## 2018-12-22 DIAGNOSIS — I10 Essential (primary) hypertension: Secondary | ICD-10-CM | POA: Diagnosis not present

## 2018-12-22 DIAGNOSIS — Z8041 Family history of malignant neoplasm of ovary: Secondary | ICD-10-CM | POA: Diagnosis not present

## 2018-12-22 DIAGNOSIS — Z86718 Personal history of other venous thrombosis and embolism: Secondary | ICD-10-CM | POA: Diagnosis not present

## 2018-12-22 DIAGNOSIS — C569 Malignant neoplasm of unspecified ovary: Secondary | ICD-10-CM | POA: Diagnosis not present

## 2018-12-22 DIAGNOSIS — N186 End stage renal disease: Secondary | ICD-10-CM | POA: Diagnosis not present

## 2018-12-22 DIAGNOSIS — E8770 Fluid overload, unspecified: Secondary | ICD-10-CM | POA: Diagnosis not present

## 2018-12-22 DIAGNOSIS — R918 Other nonspecific abnormal finding of lung field: Secondary | ICD-10-CM | POA: Diagnosis not present

## 2018-12-22 DIAGNOSIS — C787 Secondary malignant neoplasm of liver and intrahepatic bile duct: Secondary | ICD-10-CM | POA: Diagnosis present

## 2018-12-22 DIAGNOSIS — I120 Hypertensive chronic kidney disease with stage 5 chronic kidney disease or end stage renal disease: Secondary | ICD-10-CM | POA: Diagnosis present

## 2018-12-22 DIAGNOSIS — Z803 Family history of malignant neoplasm of breast: Secondary | ICD-10-CM | POA: Diagnosis not present

## 2018-12-22 DIAGNOSIS — Z9221 Personal history of antineoplastic chemotherapy: Secondary | ICD-10-CM | POA: Diagnosis not present

## 2018-12-22 DIAGNOSIS — J9811 Atelectasis: Secondary | ICD-10-CM | POA: Diagnosis not present

## 2018-12-22 DIAGNOSIS — M311 Thrombotic microangiopathy: Secondary | ICD-10-CM | POA: Diagnosis not present

## 2018-12-22 DIAGNOSIS — R0602 Shortness of breath: Secondary | ICD-10-CM | POA: Diagnosis not present

## 2018-12-22 DIAGNOSIS — R0902 Hypoxemia: Secondary | ICD-10-CM | POA: Diagnosis not present

## 2018-12-22 DIAGNOSIS — T451X5A Adverse effect of antineoplastic and immunosuppressive drugs, initial encounter: Secondary | ICD-10-CM | POA: Diagnosis present

## 2018-12-22 DIAGNOSIS — D649 Anemia, unspecified: Secondary | ICD-10-CM | POA: Diagnosis not present

## 2018-12-22 DIAGNOSIS — I499 Cardiac arrhythmia, unspecified: Secondary | ICD-10-CM | POA: Diagnosis not present

## 2018-12-22 DIAGNOSIS — F419 Anxiety disorder, unspecified: Secondary | ICD-10-CM | POA: Diagnosis present

## 2018-12-22 DIAGNOSIS — R0600 Dyspnea, unspecified: Secondary | ICD-10-CM | POA: Diagnosis not present

## 2018-12-22 DIAGNOSIS — Z90722 Acquired absence of ovaries, bilateral: Secondary | ICD-10-CM | POA: Diagnosis not present

## 2018-12-22 DIAGNOSIS — D6481 Anemia due to antineoplastic chemotherapy: Secondary | ICD-10-CM | POA: Diagnosis present

## 2018-12-22 DIAGNOSIS — E785 Hyperlipidemia, unspecified: Secondary | ICD-10-CM | POA: Diagnosis present

## 2018-12-22 DIAGNOSIS — J90 Pleural effusion, not elsewhere classified: Secondary | ICD-10-CM | POA: Diagnosis not present

## 2018-12-25 MED ORDER — NALOXONE HCL 4 MG/10ML IJ SOLN
0.40 | INTRAMUSCULAR | Status: DC
Start: ? — End: 2018-12-25

## 2018-12-25 MED ORDER — ALBUTEROL SULFATE (2.5 MG/3ML) 0.083% IN NEBU
2.50 | INHALATION_SOLUTION | RESPIRATORY_TRACT | Status: DC
Start: ? — End: 2018-12-25

## 2018-12-25 MED ORDER — POLYETHYLENE GLYCOL 3350 17 G PO PACK
17.00 | PACK | ORAL | Status: DC
Start: 2018-12-26 — End: 2018-12-25

## 2018-12-25 MED ORDER — GENERIC EXTERNAL MEDICATION
2.00 | Status: DC
Start: 2018-12-25 — End: 2018-12-25

## 2018-12-25 MED ORDER — GENERIC EXTERNAL MEDICATION
30.00 | Status: DC
Start: 2018-12-26 — End: 2018-12-25

## 2018-12-25 MED ORDER — ATORVASTATIN CALCIUM 20 MG PO TABS
20.00 | ORAL_TABLET | ORAL | Status: DC
Start: 2018-12-25 — End: 2018-12-25

## 2018-12-25 MED ORDER — AMOXICILLIN 500 MG PO CAPS
500.00 | ORAL_CAPSULE | ORAL | Status: DC
Start: 2018-12-26 — End: 2018-12-25

## 2018-12-25 MED ORDER — GENERIC EXTERNAL MEDICATION
1.80 | Status: DC
Start: ? — End: 2018-12-25

## 2018-12-25 MED ORDER — ONDANSETRON 4 MG PO TBDP
8.00 | ORAL_TABLET | ORAL | Status: DC
Start: ? — End: 2018-12-25

## 2018-12-25 MED ORDER — MORPHINE SULFATE ER 15 MG PO TBCR
15.00 | EXTENDED_RELEASE_TABLET | ORAL | Status: DC
Start: 2018-12-25 — End: 2018-12-25

## 2018-12-25 MED ORDER — HYDRALAZINE HCL 50 MG PO TABS
50.00 | ORAL_TABLET | ORAL | Status: DC
Start: 2018-12-25 — End: 2018-12-25

## 2018-12-25 MED ORDER — AMLODIPINE BESYLATE 10 MG PO TABS
10.00 | ORAL_TABLET | ORAL | Status: DC
Start: 2018-12-26 — End: 2018-12-25

## 2018-12-25 MED ORDER — HEPARIN SODIUM (PORCINE) 5000 UNIT/ML IJ SOLN
5000.00 | INTRAMUSCULAR | Status: DC
Start: 2018-12-25 — End: 2018-12-25

## 2018-12-25 MED ORDER — LABETALOL HCL 200 MG PO TABS
400.00 | ORAL_TABLET | ORAL | Status: DC
Start: 2018-12-25 — End: 2018-12-25

## 2018-12-25 MED ORDER — LORATADINE 10 MG PO TABS
10.00 | ORAL_TABLET | ORAL | Status: DC
Start: 2018-12-26 — End: 2018-12-25

## 2018-12-25 MED ORDER — GENERIC EXTERNAL MEDICATION
125.00 | Status: DC
Start: 2018-12-27 — End: 2018-12-25

## 2018-12-25 MED ORDER — ZOLPIDEM TARTRATE 5 MG PO TABS
5.00 | ORAL_TABLET | ORAL | Status: DC
Start: 2018-12-25 — End: 2018-12-25

## 2018-12-25 MED ORDER — PANTOPRAZOLE SODIUM 40 MG PO TBEC
40.00 | DELAYED_RELEASE_TABLET | ORAL | Status: DC
Start: 2018-12-25 — End: 2018-12-25

## 2018-12-25 MED ORDER — MONTELUKAST SODIUM 10 MG PO TABS
10.00 | ORAL_TABLET | ORAL | Status: DC
Start: 2018-12-26 — End: 2018-12-25

## 2018-12-25 MED ORDER — CITALOPRAM HYDROBROMIDE 20 MG PO TABS
20.00 | ORAL_TABLET | ORAL | Status: DC
Start: 2018-12-26 — End: 2018-12-25

## 2018-12-26 ENCOUNTER — Telehealth: Payer: Self-pay

## 2018-12-26 DIAGNOSIS — N19 Unspecified kidney failure: Secondary | ICD-10-CM | POA: Insufficient documentation

## 2018-12-26 NOTE — Telephone Encounter (Signed)
I have made the 1st attempt to contact the patient or family member in charge, in order to follow up from recently being discharged from the hospital. I left a message on voicemail requesting a CB. -MM 

## 2018-12-27 DIAGNOSIS — N2581 Secondary hyperparathyroidism of renal origin: Secondary | ICD-10-CM | POA: Diagnosis not present

## 2018-12-27 DIAGNOSIS — D509 Iron deficiency anemia, unspecified: Secondary | ICD-10-CM | POA: Diagnosis not present

## 2018-12-27 DIAGNOSIS — N179 Acute kidney failure, unspecified: Secondary | ICD-10-CM | POA: Diagnosis not present

## 2018-12-28 DIAGNOSIS — M311 Thrombotic microangiopathy: Secondary | ICD-10-CM | POA: Diagnosis not present

## 2018-12-28 DIAGNOSIS — D631 Anemia in chronic kidney disease: Secondary | ICD-10-CM | POA: Diagnosis not present

## 2018-12-28 DIAGNOSIS — D630 Anemia in neoplastic disease: Secondary | ICD-10-CM | POA: Diagnosis not present

## 2018-12-28 DIAGNOSIS — I120 Hypertensive chronic kidney disease with stage 5 chronic kidney disease or end stage renal disease: Secondary | ICD-10-CM | POA: Diagnosis not present

## 2018-12-28 DIAGNOSIS — N186 End stage renal disease: Secondary | ICD-10-CM | POA: Diagnosis not present

## 2018-12-28 DIAGNOSIS — C569 Malignant neoplasm of unspecified ovary: Secondary | ICD-10-CM | POA: Diagnosis not present

## 2018-12-29 DIAGNOSIS — M311 Thrombotic microangiopathy: Secondary | ICD-10-CM | POA: Diagnosis not present

## 2018-12-29 DIAGNOSIS — D630 Anemia in neoplastic disease: Secondary | ICD-10-CM | POA: Diagnosis not present

## 2018-12-29 DIAGNOSIS — N186 End stage renal disease: Secondary | ICD-10-CM | POA: Diagnosis not present

## 2018-12-29 DIAGNOSIS — D631 Anemia in chronic kidney disease: Secondary | ICD-10-CM | POA: Diagnosis not present

## 2018-12-29 DIAGNOSIS — C569 Malignant neoplasm of unspecified ovary: Secondary | ICD-10-CM | POA: Diagnosis not present

## 2018-12-29 DIAGNOSIS — I120 Hypertensive chronic kidney disease with stage 5 chronic kidney disease or end stage renal disease: Secondary | ICD-10-CM | POA: Diagnosis not present

## 2018-12-29 NOTE — Telephone Encounter (Signed)
Transition Care Management Follow-up Telephone Call  Date of discharge and from where: [facility] on 12/25/18.   How have you been since you were released from the hospital? Doing ok, has had some SOB. Pt is wearing the oxygen at all times on 2L. Will turn it up to 3 if she gets real SOB. This occurs with movement. Pt did run a fever of 100 yesterday and 101 on Saturday. Pt states she hasn't checked her fever today due to feeling better. Pt had some diarrhea yesterday but none today. Declines any other s/s.  Any questions or concerns? No   Items Reviewed:  Did the pt receive and understand the discharge instructions provided? Yes   Medications obtained and verified? Pt declined to do this over the phone.  Any new allergies since your discharge? No   Dietary orders reviewed? Yes  Do you have support at home? Yes   Other (ie: DME, Home Health, etc) Was d/c with oxygen and has [place] coming out for PT and OT.  Functional Questionnaire: (I = Independent and D = Dependent)  Bathing/Dressing- I   Meal Prep- D  Eating- I  Maintaining continence- I  Transferring/Ambulation- Uses a walker for assistance.  Managing Meds- D   Follow up appointments reviewed:    PCP Hospital f/u appt confirmed? Yes  Scheduled to see Dr. Rosanna Randy on 12/31/18 @ 4:20 AM.  [facility] f/u appt confirmed? N/A  Are transportation arrangements needed? No   If their condition worsens, is the pt aware to call  their PCP or go to the ED? Yes  Was the patient provided with contact information for the PCP's office or ED? Yes  Was the pt encouraged to call back with questions or concerns? Yes

## 2018-12-30 DIAGNOSIS — D509 Iron deficiency anemia, unspecified: Secondary | ICD-10-CM | POA: Diagnosis not present

## 2018-12-30 DIAGNOSIS — N2581 Secondary hyperparathyroidism of renal origin: Secondary | ICD-10-CM | POA: Diagnosis not present

## 2018-12-30 DIAGNOSIS — N179 Acute kidney failure, unspecified: Secondary | ICD-10-CM | POA: Diagnosis not present

## 2018-12-31 ENCOUNTER — Ambulatory Visit (INDEPENDENT_AMBULATORY_CARE_PROVIDER_SITE_OTHER): Payer: Medicare Other | Admitting: Family Medicine

## 2018-12-31 VITALS — BP 164/70 | HR 99 | Temp 97.7°F | Resp 20 | Wt 193.0 lb

## 2018-12-31 DIAGNOSIS — F419 Anxiety disorder, unspecified: Secondary | ICD-10-CM | POA: Diagnosis not present

## 2018-12-31 DIAGNOSIS — I5030 Unspecified diastolic (congestive) heart failure: Secondary | ICD-10-CM | POA: Diagnosis not present

## 2018-12-31 DIAGNOSIS — D593 Hemolytic-uremic syndrome, unspecified: Secondary | ICD-10-CM

## 2018-12-31 DIAGNOSIS — N179 Acute kidney failure, unspecified: Secondary | ICD-10-CM

## 2018-12-31 DIAGNOSIS — F3289 Other specified depressive episodes: Secondary | ICD-10-CM | POA: Diagnosis not present

## 2018-12-31 DIAGNOSIS — D630 Anemia in neoplastic disease: Secondary | ICD-10-CM | POA: Diagnosis not present

## 2018-12-31 DIAGNOSIS — N186 End stage renal disease: Secondary | ICD-10-CM | POA: Diagnosis not present

## 2018-12-31 DIAGNOSIS — C569 Malignant neoplasm of unspecified ovary: Secondary | ICD-10-CM

## 2018-12-31 DIAGNOSIS — I120 Hypertensive chronic kidney disease with stage 5 chronic kidney disease or end stage renal disease: Secondary | ICD-10-CM | POA: Diagnosis not present

## 2018-12-31 DIAGNOSIS — M311 Thrombotic microangiopathy: Secondary | ICD-10-CM | POA: Diagnosis not present

## 2018-12-31 DIAGNOSIS — D631 Anemia in chronic kidney disease: Secondary | ICD-10-CM | POA: Diagnosis not present

## 2018-12-31 NOTE — Progress Notes (Signed)
Nancy Downs  MRN: [id number] DOB: [birth year]  Subjective:  HPI   The patient is an 83 year old female who presents for hospitalization follow up.  Patient was admitted to [facility] on 12/22/18 for hypoxia.  Her discharge date was 12/25/18.  Patient was instructed at the time of discharge to continue her Prednisone taper, continue to wean off of oxygen as tolerated, continue dialysis.  Patient is currently on 2 LPM of oxygen She is breathing okay but continues to have significant fatigue.  Daughter is with her today.  She is using an oxygen concentrator and the daughter said that she would be unable to carry around an oxygen canister.  Her oxygen saturation on the concentrator today is 90 to 91%.  After taking her off for a few minutes it remained the same.  Patient Active Problem List   Diagnosis Date Noted  . Renal failure 12/26/2018  . Encounter for antineoplastic chemotherapy 05/04/2016  . Edema leg 04/11/2016  . Cellulitis 04/11/2016  . Allergic rhinitis 12/07/2015  . Secondary malignant neoplasm of liver ([place]) 12/07/2015  . Clinical depression 12/07/2015  . Elevated blood sugar 12/07/2015  . Acid reflux 12/07/2015  . Abdominal hernia 12/07/2015  . H/O: hysterectomy 12/07/2015  . Cannot sleep 12/07/2015  . Gonalgia 12/07/2015  . Cheek swelling 12/07/2015  . LBP (low back pain) 12/07/2015  . Cancer, metastatic ([place]) 12/07/2015  . Change in blood platelet count 12/07/2015  . Herpes zona 12/07/2015  . History of appendectomy 11/23/2015  . Cancer of ovary ([place]) 11/23/2015  . Osteoarthritis of knee 03/16/2015  . DJD (degenerative joint disease) of knee 12/20/2014  . 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 Primary localized osteoarthritis of left knee   . Positive test for genetic breast cancer susceptibility marker 09/08/2012  . Anxiety state 04/05/2012  . Primary malignant neoplasm of ovary ([place]) 10/22/2011  . ERRONEOUS ENCOUNTER--DISREGARD 10/17/2011  .  Abdominal wall cellulitis 10/14/2011  . Anemia 10/14/2011  . Fever 10/14/2011  . Hyperlipidemia 10/14/2011  . UTI (lower urinary tract infection) 10/14/2011  . Alopecia 04/12/2010  . Local superficial swelling, mass or lump 04/12/2010  . Avitaminosis D 04/12/2010  . Abnormal blood chemistry 11/22/2009  . Adenopathy 11/22/2009  . Malaise and fatigue 11/22/2009  . Anxiety disorder 08/07/2007  . Leg varices 08/07/2007    Past Medical History:  Diagnosis Date  . Anemia   . Anxiety   . Arthritis   . Bronchitis 08/2011  . Cancer ([place])    liver  . Constipation   . Cough   . Easy bruising   . Fatigue   . Hyperlipidemia   . PONV (postoperative nausea and vomiting)    happened just with knee surgery  2010  . Primary localized osteoarthritis of left knee     Social History   Socioeconomic History  . Marital status: Divorced    Spouse name: Not on file  . Number of children: 4  . Years of education: Not on file  . Highest education level: Some college, no degree  Occupational History  . Occupation: retired  Scientific laboratory technician  . Financial resource strain: Not hard at all  . Food insecurity:    Worry: Never true    Inability: Never true  . Transportation needs:    Medical: No    Non-medical: No  Tobacco Use  . Smoking status: Never Smoker  . Smokeless tobacco: Never Used  Substance and Sexual Activity  . Alcohol use: Yes  Comment: occasionally  . Drug use: No  . Sexual activity: Not on file  Lifestyle  . Physical activity:    Days per week: Not on file    Minutes per session: Not on file  . Stress: Only a little  Relationships  . Social connections:    Talks on phone: Not on file    Gets together: Not on file    Attends religious service: Not on file    Active member of club or organization: Not on file    Attends meetings of clubs or organizations: Not on file    Relationship status: Not on file  . Intimate partner violence:    Fear of current or ex partner:  Not on file    Emotionally abused: Not on file    Physically abused: Not on file    Forced sexual activity: Not on file  Other Topics Concern  . Not on file  Social History Narrative  . Not on file    Outpatient Encounter Medications as of 12/31/2018  Medication Sig  . ALPRAZolam (XANAX) 0.5 MG tablet TAKE ONE TABLET BY MOUTH EVERY EIGHT HOURS AS NEEDED   . amLODipine (NORVASC) 10 MG tablet Take 1 tablet by mouth daily.  Marland Kitchen amoxicillin (AMOXIL) 500 MG capsule Take 1 capsule by mouth 2 (two) times daily.  . citalopram (CELEXA) 20 MG tablet TAKE ONE TABLET BY MOUTH EVERY MORNING  . gentamicin (GARAMYCIN) 0.3 % ophthalmic solution Place 1 drop into both eyes 3 (three) times daily.  . hydrALAZINE (APRESOLINE) 50 MG tablet Take 1 tablet by mouth every 6 (six) hours.  Marland Kitchen labetalol (NORMODYNE) 200 MG tablet Take 1 tablet by mouth 2 (two) times daily.  Marland Kitchen loratadine (CLARITIN) 10 MG tablet Take 10 mg by mouth daily.  . montelukast (SINGULAIR) 10 MG tablet TAKE ONE TABLET BY MOUTH EVERY DAY  . ondansetron (ZOFRAN) 4 MG tablet Take 4 mg by mouth every 8 (eight) hours as needed for nausea or vomiting.  Marland Kitchen oxyCODONE-acetaminophen (PERCOCET/ROXICET) 5-325 MG tablet Take 1 tablet by mouth every 4 (four) hours as needed.   . pantoprazole (PROTONIX) 40 MG tablet Take 1 tablet by mouth 2 (two) times daily.  . predniSONE (DELTASONE) 10 MG tablet Take 1 tablet by mouth. Taper as directed.  . rosuvastatin (CRESTOR) 10 MG tablet Take 1 tablet (10 mg total) by mouth daily.  . sennosides-docusate sodium (SENOKOT-S) 8.6-50 MG tablet Take 2 tablets by mouth 2 (two) times daily as needed for constipation. For Constipation    . zolpidem (AMBIEN) 10 MG tablet TAKE ONE TABLET BY MOUTH AT BEDTIME   . aspirin 325 MG tablet Take 325 mg by mouth daily.  . ASPIRIN 81 PO Take daily by mouth.  . calcium carbonate (OS-CAL) 600 MG TABS tablet Take 600 mg by mouth 2 (two) times daily with a meal.  . celecoxib (CELEBREX) 200 MG  capsule Take 1 capsule (200 mg total) by mouth daily. (Patient not taking: Reported on 12/17/2018)  . HYDROcodone-acetaminophen (NORCO) 10-325 MG tablet Take 0.5 tablets by mouth 2 (two) times daily as needed.   Marland Kitchen HYDROcodone-homatropine (HYCODAN) 5-1.5 MG/5ML syrup Take 5 mLs by mouth at bedtime as needed for cough. (Patient not taking: Reported on 03/11/2018)  . Vitamin D, Ergocalciferol, (DRISDOL) 50000 units CAPS capsule TAKE 1 CAPSULE BY MOUTH EVERY WEEK ON TUESDAYS (Patient not taking: Reported on 12/17/2018)  . zolpidem (AMBIEN) 5 MG tablet Take 2 tablets (10 mg total) by mouth at bedtime.   No  facility-administered encounter medications on file as of 12/31/2018.     Allergies  Allergen Reactions  . Sodium Tetradecyl Sulfate Shortness Of Breath    SOB, coughing, rash neck/chest     Review of Systems  Constitutional: Positive for malaise/fatigue. Negative for chills and fever.  HENT: Negative.   Eyes: Negative.   Respiratory: Positive for shortness of breath and wheezing. Negative for cough.   Cardiovascular: Positive for orthopnea and leg swelling. Negative for chest pain, palpitations and claudication.  Gastrointestinal: Negative.   Skin: Negative.   Neurological: Negative.   Endo/Heme/Allergies: Negative.   Psychiatric/Behavioral: Negative.     Objective:  BP (!) 164/70 (BP Location: Right Arm, Patient Position: Sitting, Cuff Size: Normal)   Pulse 99   Temp 97.7 F (36.5 C) (Oral)   Resp 20   Wt 193 lb (87.5 kg)   SpO2 90%   BMI 30.23 kg/m   Physical Exam  Constitutional: She is oriented to person, place, and time and well-developed, well-nourished, and in no distress.  HENT:  Head: Normocephalic and atraumatic.  Right Ear: External ear normal.  Left Ear: External ear normal.  Nose: Nose normal.  Eyes: Conjunctivae are normal. No scleral icterus.  Neck: No thyromegaly present.  Cardiovascular: Normal rate, regular rhythm and normal heart sounds.  Pulmonary/Chest:  Effort normal and breath sounds normal.  Abdominal: Soft.  Lymphadenopathy:    She has no cervical adenopathy.  Neurological: She is alert and oriented to person, place, and time. Gait normal. GCS score is 15.  Skin: Skin is warm and dry.  Psychiatric: Mood, memory, affect and judgment normal.    Assessment and Plan :  1. Pulmonary edema with diastolic CHF, NYHA class 3 (HCC) Clinically improving.  Plan to wean O2 as her oxygen rises.  We will not need a permanent oxygen canister or concentrator at home.  O2 sats here in the office today at rest are stable at about 90%.  2. Primary malignant neoplasm of ovary, unspecified laterality ([place]) Continue current care.  3. Acute renal failure, unspecified acute renal failure type ([place]) On 3 times a week hemodialysis.  4. 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  Transfuse if hemoglobin is below 7.  She is getting IV iron.  5. Other depression   6. Anxiety disorder, unspecified type  I have done the exam and reviewed the chart and it is accurate to the best of my knowledge. Development worker, community has been used and  any errors in dictation or transcription are unintentional. Miguel Aschoff M.D. [facility]

## 2019-01-01 ENCOUNTER — Other Ambulatory Visit: Payer: Self-pay | Admitting: Family Medicine

## 2019-01-01 DIAGNOSIS — D509 Iron deficiency anemia, unspecified: Secondary | ICD-10-CM | POA: Diagnosis not present

## 2019-01-01 DIAGNOSIS — N179 Acute kidney failure, unspecified: Secondary | ICD-10-CM | POA: Diagnosis not present

## 2019-01-01 DIAGNOSIS — N2581 Secondary hyperparathyroidism of renal origin: Secondary | ICD-10-CM | POA: Diagnosis not present

## 2019-01-01 MED ORDER — AMLODIPINE BESYLATE 10 MG PO TABS: 10.0000 mg | ORAL_TABLET | Freq: Every day | ORAL | 3 refills | Status: AC

## 2019-01-01 NOTE — Telephone Encounter (Signed)
Pt needing a refill on: amLODipine (NORVASC) 10 MG tablet   Please fill at:  [place], [place] - [place] [phone] (Phone) [phone] (Fax)   Thanks, [employer]

## 2019-01-02 DIAGNOSIS — N179 Acute kidney failure, unspecified: Secondary | ICD-10-CM | POA: Diagnosis not present

## 2019-01-02 DIAGNOSIS — C569 Malignant neoplasm of unspecified ovary: Secondary | ICD-10-CM | POA: Diagnosis not present

## 2019-01-02 DIAGNOSIS — M311 Thrombotic microangiopathy: Secondary | ICD-10-CM | POA: Diagnosis not present

## 2019-01-03 DIAGNOSIS — N2581 Secondary hyperparathyroidism of renal origin: Secondary | ICD-10-CM | POA: Diagnosis not present

## 2019-01-03 DIAGNOSIS — N179 Acute kidney failure, unspecified: Secondary | ICD-10-CM | POA: Diagnosis not present

## 2019-01-03 DIAGNOSIS — D509 Iron deficiency anemia, unspecified: Secondary | ICD-10-CM | POA: Diagnosis not present

## 2019-01-05 DIAGNOSIS — J90 Pleural effusion, not elsewhere classified: Secondary | ICD-10-CM | POA: Diagnosis not present

## 2019-01-05 DIAGNOSIS — D631 Anemia in chronic kidney disease: Secondary | ICD-10-CM | POA: Diagnosis present

## 2019-01-05 DIAGNOSIS — N179 Acute kidney failure, unspecified: Secondary | ICD-10-CM | POA: Diagnosis not present

## 2019-01-05 DIAGNOSIS — Z803 Family history of malignant neoplasm of breast: Secondary | ICD-10-CM | POA: Diagnosis not present

## 2019-01-05 DIAGNOSIS — D630 Anemia in neoplastic disease: Secondary | ICD-10-CM | POA: Diagnosis not present

## 2019-01-05 DIAGNOSIS — D649 Anemia, unspecified: Secondary | ICD-10-CM | POA: Diagnosis not present

## 2019-01-05 DIAGNOSIS — T451X5A Adverse effect of antineoplastic and immunosuppressive drugs, initial encounter: Secondary | ICD-10-CM | POA: Diagnosis present

## 2019-01-05 DIAGNOSIS — D6959 Other secondary thrombocytopenia: Secondary | ICD-10-CM | POA: Diagnosis not present

## 2019-01-05 DIAGNOSIS — N186 End stage renal disease: Secondary | ICD-10-CM | POA: Diagnosis not present

## 2019-01-05 DIAGNOSIS — F419 Anxiety disorder, unspecified: Secondary | ICD-10-CM | POA: Diagnosis present

## 2019-01-05 DIAGNOSIS — Z9221 Personal history of antineoplastic chemotherapy: Secondary | ICD-10-CM | POA: Diagnosis not present

## 2019-01-05 DIAGNOSIS — K259 Gastric ulcer, unspecified as acute or chronic, without hemorrhage or perforation: Secondary | ICD-10-CM | POA: Diagnosis present

## 2019-01-05 DIAGNOSIS — E8770 Fluid overload, unspecified: Secondary | ICD-10-CM | POA: Diagnosis not present

## 2019-01-05 DIAGNOSIS — R918 Other nonspecific abnormal finding of lung field: Secondary | ICD-10-CM | POA: Diagnosis not present

## 2019-01-05 DIAGNOSIS — Z992 Dependence on renal dialysis: Secondary | ICD-10-CM | POA: Diagnosis not present

## 2019-01-05 DIAGNOSIS — N184 Chronic kidney disease, stage 4 (severe): Secondary | ICD-10-CM | POA: Diagnosis present

## 2019-01-05 DIAGNOSIS — I120 Hypertensive chronic kidney disease with stage 5 chronic kidney disease or end stage renal disease: Secondary | ICD-10-CM | POA: Diagnosis not present

## 2019-01-05 DIAGNOSIS — M311 Thrombotic microangiopathy: Secondary | ICD-10-CM | POA: Diagnosis not present

## 2019-01-05 DIAGNOSIS — J984 Other disorders of lung: Secondary | ICD-10-CM | POA: Diagnosis present

## 2019-01-05 DIAGNOSIS — Z6828 Body mass index (BMI) 28.0-28.9, adult: Secondary | ICD-10-CM | POA: Diagnosis not present

## 2019-01-05 DIAGNOSIS — J811 Chronic pulmonary edema: Secondary | ICD-10-CM | POA: Diagnosis not present

## 2019-01-05 DIAGNOSIS — R0602 Shortness of breath: Secondary | ICD-10-CM | POA: Diagnosis not present

## 2019-01-05 DIAGNOSIS — I129 Hypertensive chronic kidney disease with stage 1 through stage 4 chronic kidney disease, or unspecified chronic kidney disease: Secondary | ICD-10-CM | POA: Diagnosis present

## 2019-01-05 DIAGNOSIS — Z86718 Personal history of other venous thrombosis and embolism: Secondary | ICD-10-CM | POA: Diagnosis not present

## 2019-01-05 DIAGNOSIS — N189 Chronic kidney disease, unspecified: Secondary | ICD-10-CM | POA: Diagnosis not present

## 2019-01-05 DIAGNOSIS — R0601 Orthopnea: Secondary | ICD-10-CM | POA: Diagnosis not present

## 2019-01-05 DIAGNOSIS — E785 Hyperlipidemia, unspecified: Secondary | ICD-10-CM | POA: Diagnosis present

## 2019-01-05 DIAGNOSIS — Z8041 Family history of malignant neoplasm of ovary: Secondary | ICD-10-CM | POA: Diagnosis not present

## 2019-01-05 DIAGNOSIS — C569 Malignant neoplasm of unspecified ovary: Secondary | ICD-10-CM | POA: Diagnosis not present

## 2019-01-08 MED ORDER — HYDRALAZINE HCL 50 MG PO TABS
50.00 | ORAL_TABLET | ORAL | Status: DC
Start: 2019-01-08 — End: 2019-01-08

## 2019-01-08 MED ORDER — LORATADINE 10 MG PO TABS
10.00 | ORAL_TABLET | ORAL | Status: DC
Start: 2019-01-09 — End: 2019-01-08

## 2019-01-08 MED ORDER — AMLODIPINE BESYLATE 10 MG PO TABS
10.00 | ORAL_TABLET | ORAL | Status: DC
Start: 2019-01-09 — End: 2019-01-08

## 2019-01-08 MED ORDER — MONTELUKAST SODIUM 10 MG PO TABS
10.00 | ORAL_TABLET | ORAL | Status: DC
Start: 2019-01-08 — End: 2019-01-08

## 2019-01-08 MED ORDER — CALCITRIOL 0.25 MCG PO CAPS
0.25 | ORAL_CAPSULE | ORAL | Status: DC
Start: 2019-01-06 — End: 2019-01-08

## 2019-01-08 MED ORDER — ALPRAZOLAM 0.5 MG PO TABS
.50 | ORAL_TABLET | ORAL | Status: DC
Start: ? — End: 2019-01-08

## 2019-01-08 MED ORDER — GENERIC EXTERNAL MEDICATION
1.80 | Status: DC
Start: ? — End: 2019-01-08

## 2019-01-08 MED ORDER — ONDANSETRON 4 MG PO TBDP
8.00 | ORAL_TABLET | ORAL | Status: DC
Start: ? — End: 2019-01-08

## 2019-01-08 MED ORDER — GENERIC EXTERNAL MEDICATION
1.00 | Status: DC
Start: 2019-01-08 — End: 2019-01-08

## 2019-01-08 MED ORDER — CITALOPRAM HYDROBROMIDE 20 MG PO TABS
20.00 | ORAL_TABLET | ORAL | Status: DC
Start: 2019-01-09 — End: 2019-01-08

## 2019-01-08 MED ORDER — DOCUSATE SODIUM 100 MG PO CAPS
100.00 | ORAL_CAPSULE | ORAL | Status: DC
Start: 2019-01-08 — End: 2019-01-08

## 2019-01-08 MED ORDER — MORPHINE SULFATE ER 15 MG PO TBCR
15.00 | EXTENDED_RELEASE_TABLET | ORAL | Status: DC
Start: 2019-01-08 — End: 2019-01-08

## 2019-01-08 MED ORDER — FUROSEMIDE 40 MG PO TABS
40.00 | ORAL_TABLET | ORAL | Status: DC
Start: 2019-01-09 — End: 2019-01-08

## 2019-01-08 MED ORDER — PREDNISONE 10 MG PO TABS
10.00 | ORAL_TABLET | ORAL | Status: DC
Start: 2019-01-09 — End: 2019-01-08

## 2019-01-08 MED ORDER — SODIUM CHLORIDE 0.9 % IV SOLN
INTRAVENOUS | Status: DC
Start: ? — End: 2019-01-08

## 2019-01-08 MED ORDER — HEPARIN SODIUM (PORCINE) 5000 UNIT/ML IJ SOLN
5000.00 | INTRAMUSCULAR | Status: DC
Start: 2019-01-08 — End: 2019-01-08

## 2019-01-08 MED ORDER — AMOXICILLIN 500 MG PO CAPS
500.00 | ORAL_CAPSULE | ORAL | Status: DC
Start: 2019-01-08 — End: 2019-01-08

## 2019-01-08 MED ORDER — PANTOPRAZOLE SODIUM 40 MG PO TBEC
40.00 | DELAYED_RELEASE_TABLET | ORAL | Status: DC
Start: 2019-01-08 — End: 2019-01-08

## 2019-01-08 MED ORDER — LABETALOL HCL 200 MG PO TABS
400.00 | ORAL_TABLET | ORAL | Status: DC
Start: 2019-01-08 — End: 2019-01-08

## 2019-01-08 MED ORDER — ATORVASTATIN CALCIUM 20 MG PO TABS
20.00 | ORAL_TABLET | ORAL | Status: DC
Start: 2019-01-08 — End: 2019-01-08

## 2019-01-08 MED ORDER — OXYCODONE-ACETAMINOPHEN 5-325 MG PO TABS
1.00 | ORAL_TABLET | ORAL | Status: DC
Start: ? — End: 2019-01-08

## 2019-01-09 DIAGNOSIS — D631 Anemia in chronic kidney disease: Secondary | ICD-10-CM | POA: Diagnosis not present

## 2019-01-09 DIAGNOSIS — C569 Malignant neoplasm of unspecified ovary: Secondary | ICD-10-CM | POA: Diagnosis not present

## 2019-01-09 DIAGNOSIS — I120 Hypertensive chronic kidney disease with stage 5 chronic kidney disease or end stage renal disease: Secondary | ICD-10-CM | POA: Diagnosis not present

## 2019-01-09 DIAGNOSIS — N186 End stage renal disease: Secondary | ICD-10-CM | POA: Diagnosis not present

## 2019-01-09 DIAGNOSIS — M311 Thrombotic microangiopathy: Secondary | ICD-10-CM | POA: Diagnosis not present

## 2019-01-09 DIAGNOSIS — D630 Anemia in neoplastic disease: Secondary | ICD-10-CM | POA: Diagnosis not present

## 2019-01-10 DIAGNOSIS — N2581 Secondary hyperparathyroidism of renal origin: Secondary | ICD-10-CM | POA: Diagnosis not present

## 2019-01-10 DIAGNOSIS — N179 Acute kidney failure, unspecified: Secondary | ICD-10-CM | POA: Diagnosis not present

## 2019-01-10 DIAGNOSIS — D509 Iron deficiency anemia, unspecified: Secondary | ICD-10-CM | POA: Diagnosis not present

## 2019-01-11 DIAGNOSIS — R410 Disorientation, unspecified: Secondary | ICD-10-CM | POA: Diagnosis not present

## 2019-01-11 DIAGNOSIS — I611 Nontraumatic intracerebral hemorrhage in hemisphere, cortical: Secondary | ICD-10-CM | POA: Diagnosis not present

## 2019-01-11 DIAGNOSIS — I619 Nontraumatic intracerebral hemorrhage, unspecified: Secondary | ICD-10-CM | POA: Diagnosis not present

## 2019-01-11 DIAGNOSIS — J811 Chronic pulmonary edema: Secondary | ICD-10-CM | POA: Diagnosis not present

## 2019-01-11 DIAGNOSIS — R112 Nausea with vomiting, unspecified: Secondary | ICD-10-CM | POA: Diagnosis not present

## 2019-01-11 DIAGNOSIS — J90 Pleural effusion, not elsewhere classified: Secondary | ICD-10-CM | POA: Diagnosis not present

## 2019-01-11 DIAGNOSIS — R404 Transient alteration of awareness: Secondary | ICD-10-CM | POA: Diagnosis not present

## 2019-01-11 DIAGNOSIS — I618 Other nontraumatic intracerebral hemorrhage: Secondary | ICD-10-CM | POA: Diagnosis not present

## 2019-01-11 DIAGNOSIS — J320 Chronic maxillary sinusitis: Secondary | ICD-10-CM | POA: Diagnosis not present

## 2019-01-11 DIAGNOSIS — I161 Hypertensive emergency: Secondary | ICD-10-CM | POA: Diagnosis not present

## 2019-01-11 DIAGNOSIS — Z6826 Body mass index (BMI) 26.0-26.9, adult: Secondary | ICD-10-CM | POA: Diagnosis not present

## 2019-01-11 DIAGNOSIS — I499 Cardiac arrhythmia, unspecified: Secondary | ICD-10-CM | POA: Diagnosis not present

## 2019-01-11 DIAGNOSIS — R0602 Shortness of breath: Secondary | ICD-10-CM | POA: Diagnosis not present

## 2019-01-11 DIAGNOSIS — R4182 Altered mental status, unspecified: Secondary | ICD-10-CM | POA: Diagnosis not present

## 2019-01-11 DIAGNOSIS — E785 Hyperlipidemia, unspecified: Secondary | ICD-10-CM | POA: Diagnosis not present

## 2019-01-11 DIAGNOSIS — R0902 Hypoxemia: Secondary | ICD-10-CM | POA: Diagnosis not present

## 2019-01-12 DIAGNOSIS — F419 Anxiety disorder, unspecified: Secondary | ICD-10-CM | POA: Diagnosis not present

## 2019-01-12 DIAGNOSIS — Z6826 Body mass index (BMI) 26.0-26.9, adult: Secondary | ICD-10-CM | POA: Diagnosis not present

## 2019-01-12 DIAGNOSIS — I161 Hypertensive emergency: Secondary | ICD-10-CM | POA: Diagnosis not present

## 2019-01-12 DIAGNOSIS — Z79891 Long term (current) use of opiate analgesic: Secondary | ICD-10-CM | POA: Diagnosis not present

## 2019-01-12 DIAGNOSIS — I619 Nontraumatic intracerebral hemorrhage, unspecified: Secondary | ICD-10-CM | POA: Diagnosis not present

## 2019-01-12 DIAGNOSIS — I611 Nontraumatic intracerebral hemorrhage in hemisphere, cortical: Secondary | ICD-10-CM | POA: Diagnosis not present

## 2019-01-13 DIAGNOSIS — C569 Malignant neoplasm of unspecified ovary: Secondary | ICD-10-CM | POA: Diagnosis not present

## 2019-01-13 DIAGNOSIS — N189 Chronic kidney disease, unspecified: Secondary | ICD-10-CM | POA: Diagnosis not present

## 2019-01-13 DIAGNOSIS — I619 Nontraumatic intracerebral hemorrhage, unspecified: Secondary | ICD-10-CM | POA: Diagnosis not present

## 2019-01-13 DIAGNOSIS — D631 Anemia in chronic kidney disease: Secondary | ICD-10-CM | POA: Diagnosis not present

## 2019-01-13 DIAGNOSIS — N2581 Secondary hyperparathyroidism of renal origin: Secondary | ICD-10-CM | POA: Diagnosis not present

## 2019-01-13 DIAGNOSIS — M311 Thrombotic microangiopathy: Secondary | ICD-10-CM | POA: Diagnosis not present

## 2019-01-13 DIAGNOSIS — Z515 Encounter for palliative care: Secondary | ICD-10-CM | POA: Diagnosis not present

## 2019-01-13 DIAGNOSIS — I129 Hypertensive chronic kidney disease with stage 1 through stage 4 chronic kidney disease, or unspecified chronic kidney disease: Secondary | ICD-10-CM | POA: Diagnosis not present

## 2019-01-13 DIAGNOSIS — N179 Acute kidney failure, unspecified: Secondary | ICD-10-CM | POA: Diagnosis not present

## 2019-01-14 DIAGNOSIS — N189 Chronic kidney disease, unspecified: Secondary | ICD-10-CM | POA: Diagnosis not present

## 2019-01-14 DIAGNOSIS — I619 Nontraumatic intracerebral hemorrhage, unspecified: Secondary | ICD-10-CM | POA: Diagnosis not present

## 2019-01-14 DIAGNOSIS — R569 Unspecified convulsions: Secondary | ICD-10-CM | POA: Diagnosis not present

## 2019-01-15 DIAGNOSIS — N179 Acute kidney failure, unspecified: Secondary | ICD-10-CM | POA: Diagnosis not present

## 2019-01-17 DIAGNOSIS — R4182 Altered mental status, unspecified: Secondary | ICD-10-CM | POA: Diagnosis not present

## 2019-01-17 DIAGNOSIS — R0602 Shortness of breath: Secondary | ICD-10-CM | POA: Diagnosis not present

## 2019-01-17 DIAGNOSIS — M6250 Muscle wasting and atrophy, not elsewhere classified, unspecified site: Secondary | ICD-10-CM | POA: Diagnosis not present

## 2019-01-17 DIAGNOSIS — I618 Other nontraumatic intracerebral hemorrhage: Secondary | ICD-10-CM | POA: Diagnosis not present

## 2019-01-17 DIAGNOSIS — N179 Acute kidney failure, unspecified: Secondary | ICD-10-CM | POA: Diagnosis not present

## 2019-01-17 DIAGNOSIS — G40219 Localization-related (focal) (partial) symptomatic epilepsy and epileptic syndromes with complex partial seizures, intractable, without status epilepticus: Secondary | ICD-10-CM | POA: Diagnosis not present

## 2019-01-17 DIAGNOSIS — Z9989 Dependence on other enabling machines and devices: Secondary | ICD-10-CM | POA: Diagnosis not present

## 2019-01-17 DIAGNOSIS — I619 Nontraumatic intracerebral hemorrhage, unspecified: Secondary | ICD-10-CM | POA: Diagnosis not present

## 2019-01-17 DIAGNOSIS — Z452 Encounter for adjustment and management of vascular access device: Secondary | ICD-10-CM | POA: Diagnosis not present

## 2019-01-17 DIAGNOSIS — R531 Weakness: Secondary | ICD-10-CM | POA: Diagnosis not present

## 2019-01-17 DIAGNOSIS — C569 Malignant neoplasm of unspecified ovary: Secondary | ICD-10-CM | POA: Diagnosis not present

## 2019-01-17 DIAGNOSIS — R52 Pain, unspecified: Secondary | ICD-10-CM | POA: Diagnosis not present

## 2019-01-17 DIAGNOSIS — M311 Thrombotic microangiopathy: Secondary | ICD-10-CM | POA: Diagnosis not present

## 2019-01-17 MED ORDER — AMLODIPINE BESYLATE 10 MG PO TABS
10.00 | ORAL_TABLET | ORAL | Status: DC
Start: 2019-01-18 — End: 2019-01-17

## 2019-01-17 MED ORDER — INSULIN REGULAR HUMAN 100 UNIT/ML IJ SOLN
0.00 | INTRAMUSCULAR | Status: DC
Start: 2019-01-17 — End: 2019-01-17

## 2019-01-17 MED ORDER — OXYCODONE HCL 5 MG PO TABS
5.00 | ORAL_TABLET | ORAL | Status: DC
Start: 2019-01-17 — End: 2019-01-17

## 2019-01-17 MED ORDER — GENERIC EXTERNAL MEDICATION
1.80 | Status: DC
Start: ? — End: 2019-01-17

## 2019-01-17 MED ORDER — ONDANSETRON HCL 4 MG/2ML IJ SOLN
4.00 | INTRAMUSCULAR | Status: DC
Start: ? — End: 2019-01-17

## 2019-01-17 MED ORDER — LABETALOL HCL 5 MG/ML IV SOLN
10.00 | INTRAVENOUS | Status: DC
Start: ? — End: 2019-01-17

## 2019-01-17 MED ORDER — FENTANYL CITRATE (PF) 50 MCG/ML IJ SOLN
25.00 | INTRAMUSCULAR | Status: DC
Start: ? — End: 2019-01-17

## 2019-01-17 MED ORDER — MONTELUKAST SODIUM 10 MG PO TABS
10.00 | ORAL_TABLET | ORAL | Status: DC
Start: 2019-01-17 — End: 2019-01-17

## 2019-01-17 MED ORDER — LABETALOL HCL 200 MG PO TABS
400.00 | ORAL_TABLET | ORAL | Status: DC
Start: 2019-01-17 — End: 2019-01-17

## 2019-01-17 MED ORDER — VALSARTAN 40 MG PO TABS
40.00 | ORAL_TABLET | ORAL | Status: DC
Start: 2019-01-18 — End: 2019-01-17

## 2019-01-17 MED ORDER — LEVETIRACETAM 500 MG PO TABS
500.00 | ORAL_TABLET | ORAL | Status: DC
Start: 2019-01-17 — End: 2019-01-17

## 2019-01-17 MED ORDER — FUROSEMIDE 40 MG PO TABS
40.00 | ORAL_TABLET | ORAL | Status: DC
Start: 2019-01-18 — End: 2019-01-17

## 2019-01-17 MED ORDER — AMOXICILLIN 500 MG PO CAPS
500.00 | ORAL_CAPSULE | ORAL | Status: DC
Start: 2019-01-17 — End: 2019-01-17

## 2019-01-17 MED ORDER — HYDRALAZINE HCL 25 MG PO TABS
75.00 | ORAL_TABLET | ORAL | Status: DC
Start: 2019-01-17 — End: 2019-01-17

## 2019-01-17 MED ORDER — DEXTROSE 10 % IV SOLN
12.50 | INTRAVENOUS | Status: DC
Start: ? — End: 2019-01-17

## 2019-01-17 MED ORDER — PREDNISONE 10 MG PO TABS
10.00 | ORAL_TABLET | ORAL | Status: DC
Start: 2019-01-18 — End: 2019-01-17

## 2019-01-17 MED ORDER — HYDRALAZINE HCL 20 MG/ML IJ SOLN
10.00 | INTRAMUSCULAR | Status: DC
Start: ? — End: 2019-01-17

## 2019-01-18 DIAGNOSIS — R52 Pain, unspecified: Secondary | ICD-10-CM | POA: Diagnosis not present

## 2019-01-18 DIAGNOSIS — I618 Other nontraumatic intracerebral hemorrhage: Secondary | ICD-10-CM | POA: Diagnosis not present

## 2019-01-18 DIAGNOSIS — M311 Thrombotic microangiopathy: Secondary | ICD-10-CM | POA: Diagnosis not present

## 2019-01-18 DIAGNOSIS — C569 Malignant neoplasm of unspecified ovary: Secondary | ICD-10-CM | POA: Diagnosis not present

## 2019-01-18 DIAGNOSIS — R0602 Shortness of breath: Secondary | ICD-10-CM | POA: Diagnosis not present

## 2019-01-18 DIAGNOSIS — N179 Acute kidney failure, unspecified: Secondary | ICD-10-CM | POA: Diagnosis not present

## 2019-01-19 DIAGNOSIS — I618 Other nontraumatic intracerebral hemorrhage: Secondary | ICD-10-CM | POA: Diagnosis not present

## 2019-01-19 DIAGNOSIS — R52 Pain, unspecified: Secondary | ICD-10-CM | POA: Diagnosis not present

## 2019-01-19 DIAGNOSIS — N179 Acute kidney failure, unspecified: Secondary | ICD-10-CM | POA: Diagnosis not present

## 2019-01-19 DIAGNOSIS — M311 Thrombotic microangiopathy: Secondary | ICD-10-CM | POA: Diagnosis not present

## 2019-01-19 DIAGNOSIS — C569 Malignant neoplasm of unspecified ovary: Secondary | ICD-10-CM | POA: Diagnosis not present

## 2019-01-19 DIAGNOSIS — R0602 Shortness of breath: Secondary | ICD-10-CM | POA: Diagnosis not present

## 2019-01-20 DIAGNOSIS — R52 Pain, unspecified: Secondary | ICD-10-CM | POA: Diagnosis not present

## 2019-01-20 DIAGNOSIS — I618 Other nontraumatic intracerebral hemorrhage: Secondary | ICD-10-CM | POA: Diagnosis not present

## 2019-01-20 DIAGNOSIS — C569 Malignant neoplasm of unspecified ovary: Secondary | ICD-10-CM | POA: Diagnosis not present

## 2019-01-20 DIAGNOSIS — M311 Thrombotic microangiopathy: Secondary | ICD-10-CM | POA: Diagnosis not present

## 2019-01-20 DIAGNOSIS — R0602 Shortness of breath: Secondary | ICD-10-CM | POA: Diagnosis not present

## 2019-01-20 DIAGNOSIS — N179 Acute kidney failure, unspecified: Secondary | ICD-10-CM | POA: Diagnosis not present

## 2019-01-21 ENCOUNTER — Telehealth: Payer: Self-pay | Admitting: Family Medicine

## 2019-01-21 DIAGNOSIS — I618 Other nontraumatic intracerebral hemorrhage: Secondary | ICD-10-CM | POA: Diagnosis not present

## 2019-01-21 DIAGNOSIS — R52 Pain, unspecified: Secondary | ICD-10-CM | POA: Diagnosis not present

## 2019-01-21 DIAGNOSIS — M311 Thrombotic microangiopathy: Secondary | ICD-10-CM | POA: Diagnosis not present

## 2019-01-21 DIAGNOSIS — N179 Acute kidney failure, unspecified: Secondary | ICD-10-CM | POA: Diagnosis not present

## 2019-01-21 DIAGNOSIS — R0602 Shortness of breath: Secondary | ICD-10-CM | POA: Diagnosis not present

## 2019-01-21 DIAGNOSIS — C569 Malignant neoplasm of unspecified ovary: Secondary | ICD-10-CM | POA: Diagnosis not present

## 2019-01-21 NOTE — Telephone Encounter (Signed)
Pt's Daughter Merry Lofty, called to let Dr. Rosanna Randy know pt had a massive stroke on Feb. 2, 2020.  She was moved to [employer] [employer] on Feb. 8, 2020.  Thanks, [employer]

## 2019-01-22 DIAGNOSIS — R0602 Shortness of breath: Secondary | ICD-10-CM | POA: Diagnosis not present

## 2019-01-22 DIAGNOSIS — C569 Malignant neoplasm of unspecified ovary: Secondary | ICD-10-CM | POA: Diagnosis not present

## 2019-01-22 DIAGNOSIS — M311 Thrombotic microangiopathy: Secondary | ICD-10-CM | POA: Diagnosis not present

## 2019-01-22 DIAGNOSIS — I618 Other nontraumatic intracerebral hemorrhage: Secondary | ICD-10-CM | POA: Diagnosis not present

## 2019-01-22 DIAGNOSIS — N179 Acute kidney failure, unspecified: Secondary | ICD-10-CM | POA: Diagnosis not present

## 2019-01-22 DIAGNOSIS — R52 Pain, unspecified: Secondary | ICD-10-CM | POA: Diagnosis not present

## 2019-01-22 NOTE — Telephone Encounter (Signed)
FYI

## 2019-01-23 DIAGNOSIS — M311 Thrombotic microangiopathy: Secondary | ICD-10-CM | POA: Diagnosis not present

## 2019-01-23 DIAGNOSIS — N179 Acute kidney failure, unspecified: Secondary | ICD-10-CM | POA: Diagnosis not present

## 2019-01-23 DIAGNOSIS — C569 Malignant neoplasm of unspecified ovary: Secondary | ICD-10-CM | POA: Diagnosis not present

## 2019-01-23 DIAGNOSIS — I618 Other nontraumatic intracerebral hemorrhage: Secondary | ICD-10-CM | POA: Diagnosis not present

## 2019-01-23 DIAGNOSIS — R0602 Shortness of breath: Secondary | ICD-10-CM | POA: Diagnosis not present

## 2019-01-23 DIAGNOSIS — R52 Pain, unspecified: Secondary | ICD-10-CM | POA: Diagnosis not present

## 2019-01-24 DIAGNOSIS — C569 Malignant neoplasm of unspecified ovary: Secondary | ICD-10-CM | POA: Diagnosis not present

## 2019-01-24 DIAGNOSIS — N179 Acute kidney failure, unspecified: Secondary | ICD-10-CM | POA: Diagnosis not present

## 2019-01-24 DIAGNOSIS — R0602 Shortness of breath: Secondary | ICD-10-CM | POA: Diagnosis not present

## 2019-01-24 DIAGNOSIS — I618 Other nontraumatic intracerebral hemorrhage: Secondary | ICD-10-CM | POA: Diagnosis not present

## 2019-01-24 DIAGNOSIS — R52 Pain, unspecified: Secondary | ICD-10-CM | POA: Diagnosis not present

## 2019-01-24 DIAGNOSIS — M311 Thrombotic microangiopathy: Secondary | ICD-10-CM | POA: Diagnosis not present

## 2019-01-25 DIAGNOSIS — C569 Malignant neoplasm of unspecified ovary: Secondary | ICD-10-CM | POA: Diagnosis not present

## 2019-01-25 DIAGNOSIS — I618 Other nontraumatic intracerebral hemorrhage: Secondary | ICD-10-CM | POA: Diagnosis not present

## 2019-01-25 DIAGNOSIS — N179 Acute kidney failure, unspecified: Secondary | ICD-10-CM | POA: Diagnosis not present

## 2019-01-25 DIAGNOSIS — M311 Thrombotic microangiopathy: Secondary | ICD-10-CM | POA: Diagnosis not present

## 2019-01-25 DIAGNOSIS — R0602 Shortness of breath: Secondary | ICD-10-CM | POA: Diagnosis not present

## 2019-01-25 DIAGNOSIS — R52 Pain, unspecified: Secondary | ICD-10-CM | POA: Diagnosis not present

## 2019-01-26 DIAGNOSIS — R0602 Shortness of breath: Secondary | ICD-10-CM | POA: Diagnosis not present

## 2019-01-26 DIAGNOSIS — R52 Pain, unspecified: Secondary | ICD-10-CM | POA: Diagnosis not present

## 2019-01-26 DIAGNOSIS — N179 Acute kidney failure, unspecified: Secondary | ICD-10-CM | POA: Diagnosis not present

## 2019-01-26 DIAGNOSIS — I618 Other nontraumatic intracerebral hemorrhage: Secondary | ICD-10-CM | POA: Diagnosis not present

## 2019-01-26 DIAGNOSIS — C569 Malignant neoplasm of unspecified ovary: Secondary | ICD-10-CM | POA: Diagnosis not present

## 2019-01-26 DIAGNOSIS — M311 Thrombotic microangiopathy: Secondary | ICD-10-CM | POA: Diagnosis not present

## 2019-02-08 DEATH — deceased

## 2019-03-04 ENCOUNTER — Ambulatory Visit: Payer: Self-pay

## 2019-05-20 ENCOUNTER — Ambulatory Visit: Payer: Self-pay | Admitting: Family Medicine

## 2019-12-22 IMAGING — CR DG CHEST 2V
1 series · 2 of 2 positions shown · non-contrast
Comparison: Chest x-ray dated May 05, 2013

CLINICAL DATA: Five days of cough. The patient is on chemotherapy
for ovarian malignancy. Nonsmoker.

EXAM:
CHEST  2 VIEW

[Series 1: dg chest 2 view · 0.14mm/px · 2 of 2 slices shown]
[im 1/2]
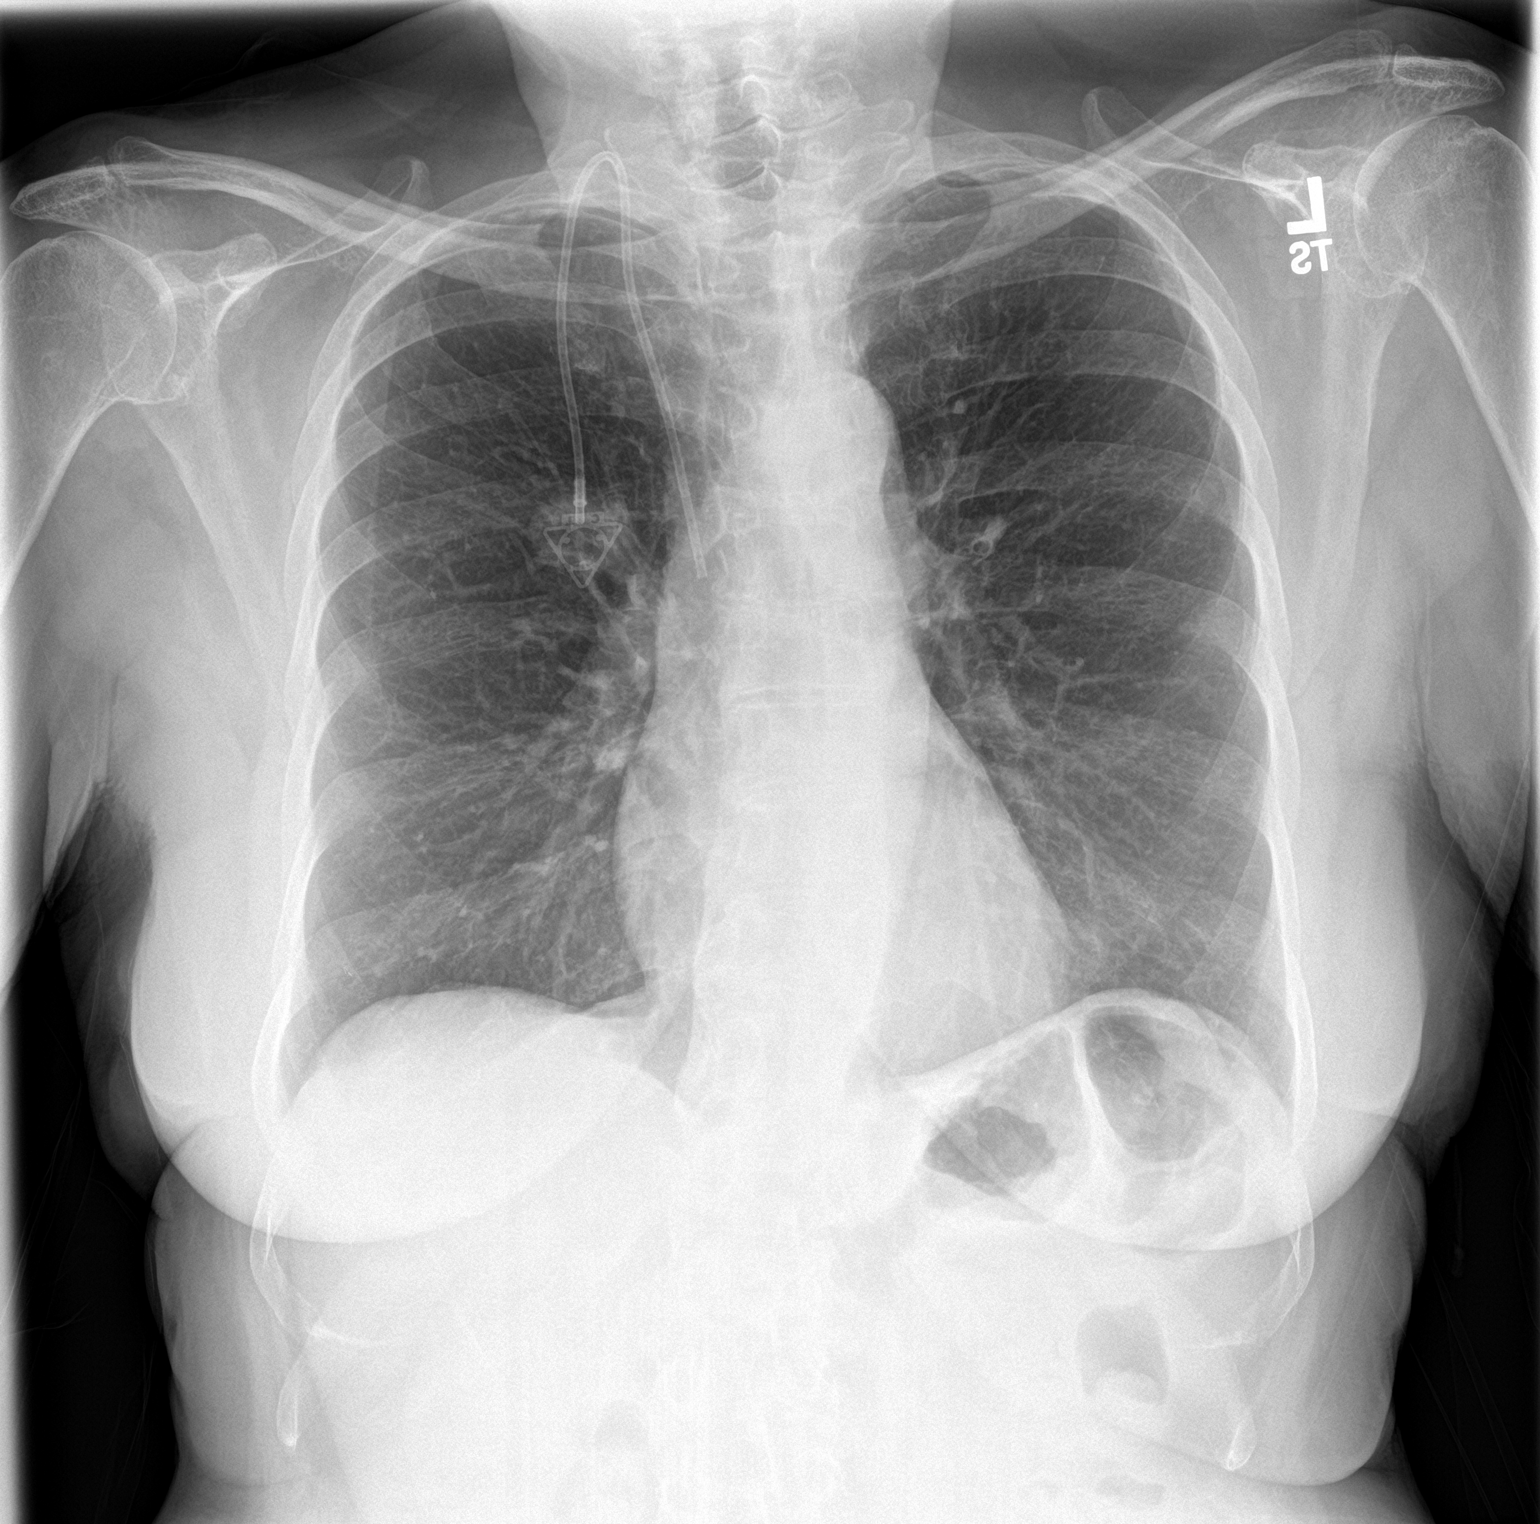
[im 2/2]
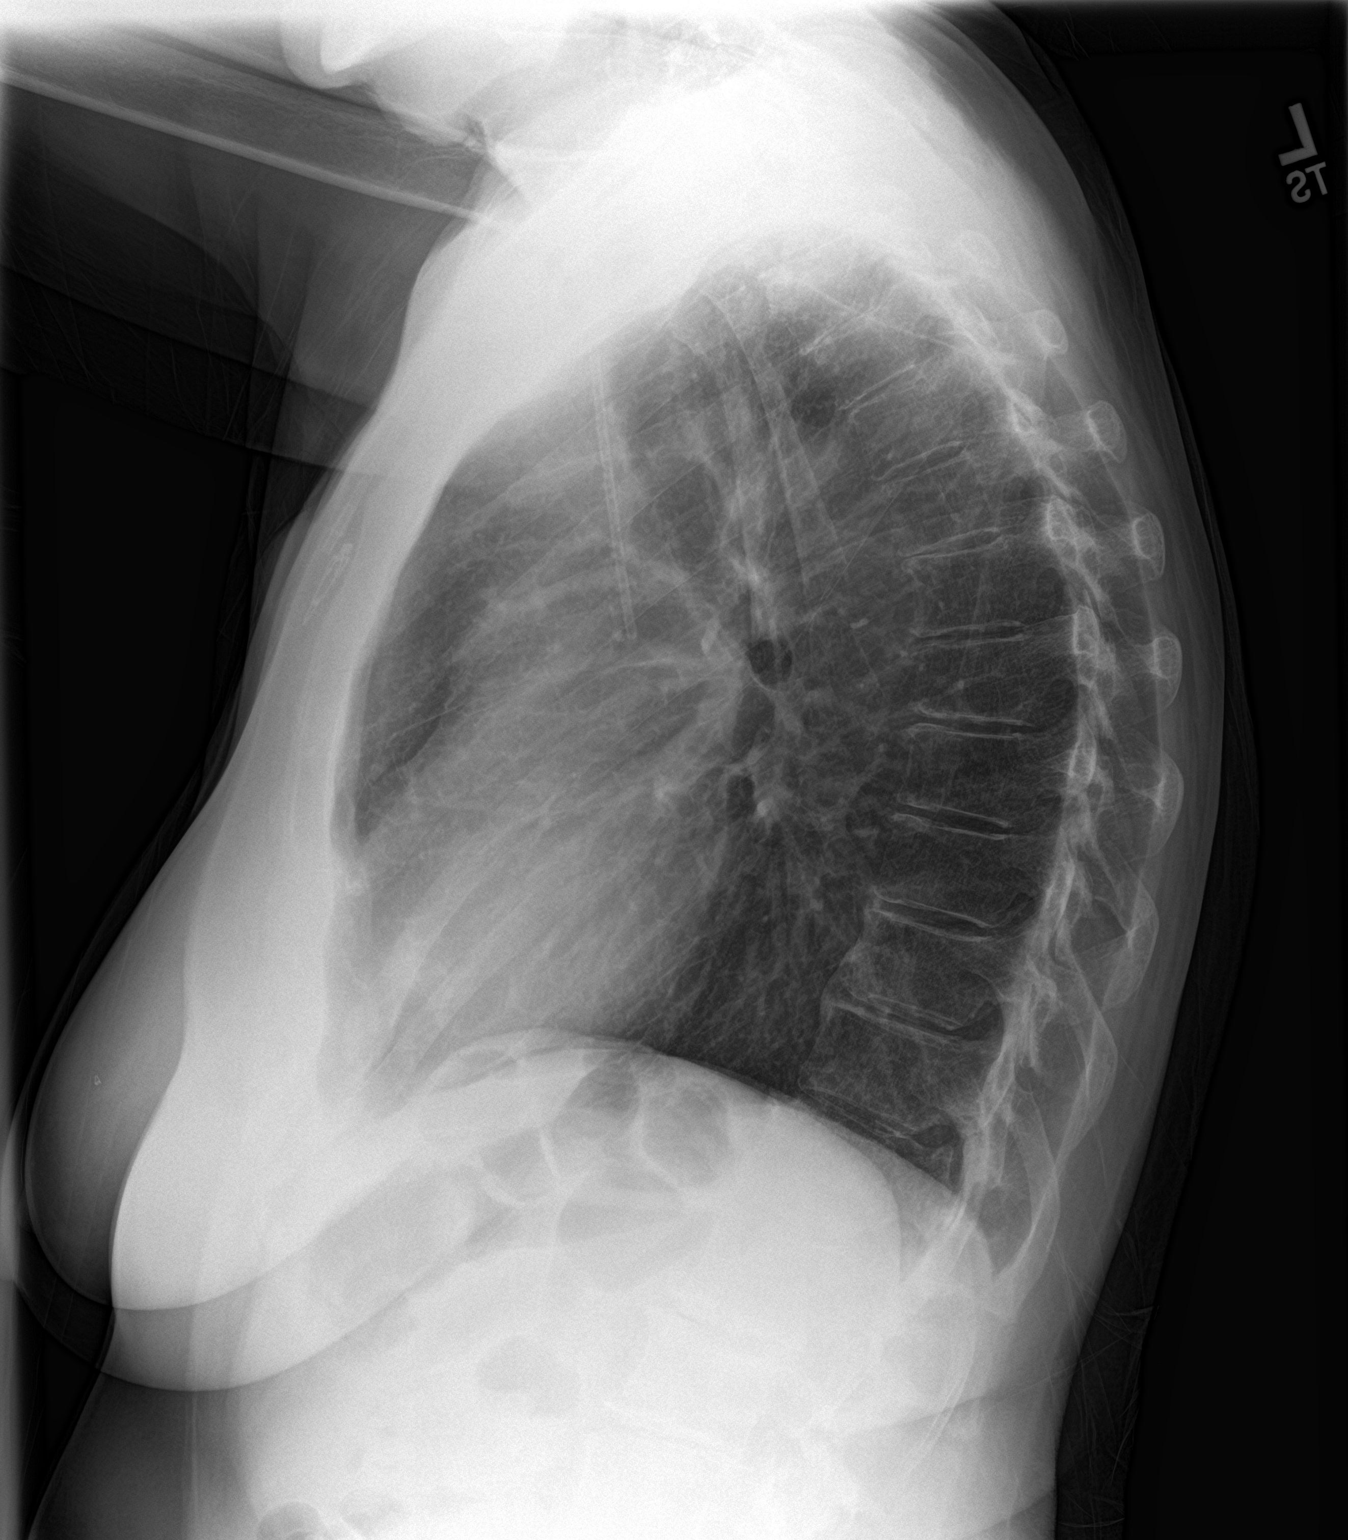

[2 of 2 positions shown; findings below may reference images not displayed]

FINDINGS: The lungs are adequately inflated. There is no focal infiltrate.
There is no pleural effusion. The heart and pulmonary vascularity
are normal. The mediastinum is normal in width. The power port
catheter tip projects over the midportion of the SVC. The bony
thorax exhibits no acute abnormality.
IMPRESSION: There is no pneumonia, CHF, nor other acute cardiopulmonary disease.
Specifically there are no findings to suggest metastatic disease to
the thorax.
